# Patient Record
Sex: Female | Born: 1955
Health system: Southern US, Community
[De-identification: ages and names within clinical notes are randomized; demographics above are authoritative.]

## PROBLEM LIST (undated history)

## (undated) DIAGNOSIS — F419 Anxiety disorder, unspecified: Secondary | ICD-10-CM

## (undated) DIAGNOSIS — J189 Pneumonia, unspecified organism: Secondary | ICD-10-CM

## (undated) DIAGNOSIS — F32A Depression, unspecified: Secondary | ICD-10-CM

## (undated) DIAGNOSIS — D649 Anemia, unspecified: Secondary | ICD-10-CM

## (undated) DIAGNOSIS — E119 Type 2 diabetes mellitus without complications: Secondary | ICD-10-CM

## (undated) DIAGNOSIS — C189 Malignant neoplasm of colon, unspecified: Secondary | ICD-10-CM

## (undated) DIAGNOSIS — E785 Hyperlipidemia, unspecified: Secondary | ICD-10-CM

## (undated) DIAGNOSIS — F329 Major depressive disorder, single episode, unspecified: Secondary | ICD-10-CM

## (undated) DIAGNOSIS — G510 Bell's palsy: Secondary | ICD-10-CM

## (undated) DIAGNOSIS — E559 Vitamin D deficiency, unspecified: Secondary | ICD-10-CM

## (undated) DIAGNOSIS — N879 Dysplasia of cervix uteri, unspecified: Secondary | ICD-10-CM

## (undated) HISTORY — DX: Type 2 diabetes mellitus without complications: E11.9

## (undated) HISTORY — DX: Malignant neoplasm of colon, unspecified: C18.9

## (undated) HISTORY — DX: Dysplasia of cervix uteri, unspecified: N87.9

## (undated) HISTORY — DX: Anxiety disorder, unspecified: F41.9

## (undated) HISTORY — DX: Hyperlipidemia, unspecified: E78.5

## (undated) HISTORY — DX: Vitamin D deficiency, unspecified: E55.9

## (undated) HISTORY — PX: APPENDECTOMY: SHX54

## (undated) HISTORY — PX: COLONOSCOPY: SHX174

## (undated) HISTORY — DX: Major depressive disorder, single episode, unspecified: F32.9

## (undated) HISTORY — DX: Depression, unspecified: F32.A

---
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
## 2004-02-21 ENCOUNTER — Ambulatory Visit (HOSPITAL_COMMUNITY): Admission: RE | Admit: 2004-02-21 | Discharge: 2004-02-21 | Payer: Self-pay | Admitting: Internal Medicine

## 2004-04-04 ENCOUNTER — Ambulatory Visit: Payer: Self-pay | Admitting: Oncology

## 2004-04-17 ENCOUNTER — Encounter (INDEPENDENT_AMBULATORY_CARE_PROVIDER_SITE_OTHER): Payer: Self-pay | Admitting: Specialist

## 2004-04-17 ENCOUNTER — Ambulatory Visit (HOSPITAL_COMMUNITY): Admission: RE | Admit: 2004-04-17 | Discharge: 2004-04-17 | Payer: Self-pay | Admitting: Oncology

## 2006-11-04 ENCOUNTER — Ambulatory Visit: Payer: Self-pay | Admitting: Family Medicine

## 2006-11-04 ENCOUNTER — Inpatient Hospital Stay (HOSPITAL_COMMUNITY): Admission: EM | Admit: 2006-11-04 | Discharge: 2006-11-07 | Payer: Self-pay | Admitting: Emergency Medicine

## 2007-09-12 ENCOUNTER — Other Ambulatory Visit: Admission: RE | Admit: 2007-09-12 | Discharge: 2007-09-12 | Payer: Self-pay | Admitting: Internal Medicine

## 2008-05-31 HISTORY — PX: PARTIAL COLECTOMY: SHX5273

## 2008-05-31 HISTORY — PX: LAPAROSCOPIC CHOLECYSTECTOMY: SUR755

## 2008-06-16 ENCOUNTER — Emergency Department (HOSPITAL_COMMUNITY): Admission: EM | Admit: 2008-06-16 | Discharge: 2008-06-17 | Payer: Self-pay | Admitting: Emergency Medicine

## 2008-07-08 ENCOUNTER — Ambulatory Visit (HOSPITAL_COMMUNITY): Admission: RE | Admit: 2008-07-08 | Discharge: 2008-07-08 | Payer: Self-pay | Admitting: Internal Medicine

## 2008-07-12 ENCOUNTER — Emergency Department (HOSPITAL_COMMUNITY): Admission: EM | Admit: 2008-07-12 | Discharge: 2008-07-13 | Payer: Self-pay | Admitting: Emergency Medicine

## 2008-08-16 ENCOUNTER — Encounter (INDEPENDENT_AMBULATORY_CARE_PROVIDER_SITE_OTHER): Payer: Self-pay | Admitting: *Deleted

## 2008-08-16 ENCOUNTER — Ambulatory Visit (HOSPITAL_COMMUNITY): Admission: RE | Admit: 2008-08-16 | Discharge: 2008-08-16 | Payer: Self-pay | Admitting: *Deleted

## 2008-09-10 ENCOUNTER — Emergency Department (HOSPITAL_BASED_OUTPATIENT_CLINIC_OR_DEPARTMENT_OTHER): Admission: EM | Admit: 2008-09-10 | Discharge: 2008-09-10 | Payer: Self-pay | Admitting: Emergency Medicine

## 2008-09-10 ENCOUNTER — Ambulatory Visit: Payer: Self-pay | Admitting: Diagnostic Radiology

## 2008-12-08 ENCOUNTER — Emergency Department (HOSPITAL_COMMUNITY): Admission: EM | Admit: 2008-12-08 | Discharge: 2008-12-09 | Payer: Self-pay | Admitting: Emergency Medicine

## 2009-01-10 ENCOUNTER — Encounter: Admission: RE | Admit: 2009-01-10 | Discharge: 2009-01-10 | Payer: Self-pay | Admitting: General Surgery

## 2009-01-15 ENCOUNTER — Inpatient Hospital Stay (HOSPITAL_COMMUNITY): Admission: RE | Admit: 2009-01-15 | Discharge: 2009-01-21 | Payer: Self-pay | Admitting: General Surgery

## 2009-01-15 ENCOUNTER — Encounter (INDEPENDENT_AMBULATORY_CARE_PROVIDER_SITE_OTHER): Payer: Self-pay | Admitting: General Surgery

## 2009-01-29 ENCOUNTER — Ambulatory Visit: Payer: Self-pay | Admitting: Hematology & Oncology

## 2009-02-10 LAB — CEA: CEA: 1.3 ng/mL (ref 0.0–5.0)

## 2009-02-10 LAB — COMPREHENSIVE METABOLIC PANEL
Albumin: 4.5 g/dL (ref 3.5–5.2)
BUN: 11 mg/dL (ref 6–23)
Calcium: 9.9 mg/dL (ref 8.4–10.5)
Chloride: 104 mEq/L (ref 96–112)
Glucose, Bld: 141 mg/dL — ABNORMAL HIGH (ref 70–99)
Potassium: 4.7 mEq/L (ref 3.5–5.3)

## 2009-02-10 LAB — CBC WITH DIFFERENTIAL (CANCER CENTER ONLY)
BASO%: 0.6 % (ref 0.0–2.0)
EOS%: 5 % (ref 0.0–7.0)
HCT: 33.8 % — ABNORMAL LOW (ref 34.8–46.6)
LYMPH#: 2.5 10*3/uL (ref 0.9–3.3)
MCHC: 32.3 g/dL (ref 32.0–36.0)
NEUT#: 5.7 10*3/uL (ref 1.5–6.5)
NEUT%: 62.5 % (ref 39.6–80.0)
RDW: 16.6 % — ABNORMAL HIGH (ref 10.5–14.6)

## 2009-02-19 ENCOUNTER — Ambulatory Visit (HOSPITAL_COMMUNITY): Admission: RE | Admit: 2009-02-19 | Discharge: 2009-02-19 | Payer: Self-pay | Admitting: General Surgery

## 2009-03-07 ENCOUNTER — Ambulatory Visit: Payer: Self-pay | Admitting: Hematology & Oncology

## 2009-03-10 LAB — COMPREHENSIVE METABOLIC PANEL
ALT: 23 U/L (ref 0–35)
Albumin: 3.9 g/dL (ref 3.5–5.2)
Alkaline Phosphatase: 100 U/L (ref 39–117)
CO2: 23 mEq/L (ref 19–32)
Potassium: 4.2 mEq/L (ref 3.5–5.3)
Sodium: 138 mEq/L (ref 135–145)
Total Bilirubin: 0.5 mg/dL (ref 0.3–1.2)
Total Protein: 6.4 g/dL (ref 6.0–8.3)

## 2009-03-10 LAB — CBC WITH DIFFERENTIAL (CANCER CENTER ONLY)
Eosinophils Absolute: 0.4 10*3/uL (ref 0.0–0.5)
HCT: 31 % — ABNORMAL LOW (ref 34.8–46.6)
LYMPH%: 27.8 % (ref 14.0–48.0)
MCV: 61 fL — ABNORMAL LOW (ref 81–101)
MONO#: 0.5 10*3/uL (ref 0.1–0.9)
NEUT%: 62.1 % (ref 39.6–80.0)
RBC: 5.08 10*6/uL (ref 3.70–5.32)
RDW: 17.4 % — ABNORMAL HIGH (ref 10.5–14.6)
WBC: 9 10*3/uL (ref 3.9–10.0)

## 2009-03-24 LAB — CBC WITH DIFFERENTIAL (CANCER CENTER ONLY)
BASO%: 0.8 % (ref 0.0–2.0)
EOS%: 4.3 % (ref 0.0–7.0)
HCT: 35.7 % (ref 34.8–46.6)
LYMPH%: 29.1 % (ref 14.0–48.0)
MCH: 20.3 pg — ABNORMAL LOW (ref 26.0–34.0)
MCHC: 31.9 g/dL — ABNORMAL LOW (ref 32.0–36.0)
MCV: 64 fL — ABNORMAL LOW (ref 81–101)
NEUT%: 60 % (ref 39.6–80.0)
RDW: 17.2 % — ABNORMAL HIGH (ref 10.5–14.6)

## 2009-03-25 LAB — COMPREHENSIVE METABOLIC PANEL
Alkaline Phosphatase: 112 U/L (ref 39–117)
BUN: 17 mg/dL (ref 6–23)
Glucose, Bld: 210 mg/dL — ABNORMAL HIGH (ref 70–99)
Sodium: 141 mEq/L (ref 135–145)
Total Bilirubin: 0.5 mg/dL (ref 0.3–1.2)

## 2009-04-11 ENCOUNTER — Ambulatory Visit: Payer: Self-pay | Admitting: Hematology & Oncology

## 2009-04-14 LAB — CBC WITH DIFFERENTIAL (CANCER CENTER ONLY)
BASO#: 0.1 10*3/uL (ref 0.0–0.2)
Eosinophils Absolute: 0.5 10*3/uL (ref 0.0–0.5)
HCT: 38.4 % (ref 34.8–46.6)
HGB: 12.7 g/dL (ref 11.6–15.9)
LYMPH%: 28.1 % (ref 14.0–48.0)
MCV: 63 fL — ABNORMAL LOW (ref 81–101)
MONO#: 0.7 10*3/uL (ref 0.1–0.9)
NEUT%: 60.4 % (ref 39.6–80.0)
RBC: 6.07 10*6/uL — ABNORMAL HIGH (ref 3.70–5.32)
RDW: 18.7 % — ABNORMAL HIGH (ref 10.5–14.6)
WBC: 10.6 10*3/uL — ABNORMAL HIGH (ref 3.9–10.0)

## 2009-04-14 LAB — COMPREHENSIVE METABOLIC PANEL
ALT: 19 U/L (ref 0–35)
BUN: 13 mg/dL (ref 6–23)
CO2: 24 mEq/L (ref 19–32)
Calcium: 9.1 mg/dL (ref 8.4–10.5)
Chloride: 102 mEq/L (ref 96–112)
Creatinine, Ser: 0.78 mg/dL (ref 0.40–1.20)
Glucose, Bld: 234 mg/dL — ABNORMAL HIGH (ref 70–99)

## 2009-04-14 LAB — TECHNOLOGIST REVIEW CHCC SATELLITE

## 2009-04-30 LAB — CBC WITH DIFFERENTIAL (CANCER CENTER ONLY)
Eosinophils Absolute: 0.3 10*3/uL (ref 0.0–0.5)
MONO#: 0.6 10*3/uL (ref 0.1–0.9)
MONO%: 8.9 % (ref 0.0–13.0)
NEUT#: 4 10*3/uL (ref 1.5–6.5)
Platelets: 190 10*3/uL (ref 145–400)
RBC: 5.35 10*6/uL — ABNORMAL HIGH (ref 3.70–5.32)
WBC: 6.9 10*3/uL (ref 3.9–10.0)

## 2009-04-30 LAB — COMPREHENSIVE METABOLIC PANEL
ALT: 15 U/L (ref 0–35)
Albumin: 4.2 g/dL (ref 3.5–5.2)
CO2: 24 mEq/L (ref 19–32)
Calcium: 9.1 mg/dL (ref 8.4–10.5)
Chloride: 103 mEq/L (ref 96–112)
Glucose, Bld: 219 mg/dL — ABNORMAL HIGH (ref 70–99)
Sodium: 138 mEq/L (ref 135–145)
Total Protein: 6.9 g/dL (ref 6.0–8.3)

## 2009-05-13 ENCOUNTER — Ambulatory Visit: Payer: Self-pay | Admitting: Hematology & Oncology

## 2009-05-14 LAB — COMPREHENSIVE METABOLIC PANEL
ALT: 15 U/L (ref 0–35)
AST: 18 U/L (ref 0–37)
Alkaline Phosphatase: 124 U/L — ABNORMAL HIGH (ref 39–117)
Creatinine, Ser: 0.76 mg/dL (ref 0.40–1.20)
Sodium: 138 mEq/L (ref 135–145)
Total Bilirubin: 0.8 mg/dL (ref 0.3–1.2)
Total Protein: 7.2 g/dL (ref 6.0–8.3)

## 2009-05-14 LAB — CBC WITH DIFFERENTIAL (CANCER CENTER ONLY)
Eosinophils Absolute: 0.3 10*3/uL (ref 0.0–0.5)
HCT: 35.8 % (ref 34.8–46.6)
LYMPH%: 21.9 % (ref 14.0–48.0)
MCV: 65 fL — ABNORMAL LOW (ref 81–101)
MONO#: 0.7 10*3/uL (ref 0.1–0.9)
Platelets: 174 10*3/uL (ref 145–400)
RBC: 5.51 10*6/uL — ABNORMAL HIGH (ref 3.70–5.32)
WBC: 9.7 10*3/uL (ref 3.9–10.0)

## 2009-05-14 LAB — TECHNOLOGIST REVIEW CHCC SATELLITE

## 2009-06-02 ENCOUNTER — Ambulatory Visit: Payer: Self-pay | Admitting: Hematology & Oncology

## 2009-06-02 LAB — CBC WITH DIFFERENTIAL (CANCER CENTER ONLY)
BASO#: 0.1 10*3/uL (ref 0.0–0.2)
BASO%: 0.8 % (ref 0.0–2.0)
HCT: 35.5 % (ref 34.8–46.6)
HGB: 11.6 g/dL (ref 11.6–15.9)
LYMPH#: 2.2 10*3/uL (ref 0.9–3.3)
LYMPH%: 30.1 % (ref 14.0–48.0)
MCV: 68 fL — ABNORMAL LOW (ref 81–101)
MONO#: 0.6 10*3/uL (ref 0.1–0.9)
NEUT%: 56.1 % (ref 39.6–80.0)
RDW: 21.2 % — ABNORMAL HIGH (ref 10.5–14.6)
WBC: 7.2 10*3/uL (ref 3.9–10.0)

## 2009-06-02 LAB — CEA: CEA: 3.2 ng/mL (ref 0.0–5.0)

## 2009-06-04 LAB — COMPREHENSIVE METABOLIC PANEL
ALT: 10 U/L (ref 0–35)
BUN: 7 mg/dL (ref 6–23)
CO2: 19 mEq/L (ref 19–32)
Calcium: 8.6 mg/dL (ref 8.4–10.5)
Chloride: 102 mEq/L (ref 96–112)
Creatinine, Ser: 0.57 mg/dL (ref 0.40–1.20)
Glucose, Bld: 213 mg/dL — ABNORMAL HIGH (ref 70–99)
Total Bilirubin: 0.8 mg/dL (ref 0.3–1.2)

## 2009-06-18 LAB — COMPREHENSIVE METABOLIC PANEL
ALT: 11 U/L (ref 0–35)
AST: 17 U/L (ref 0–37)
Albumin: 4 g/dL (ref 3.5–5.2)
BUN: 7 mg/dL (ref 6–23)
CO2: 26 mEq/L (ref 19–32)
Calcium: 9.2 mg/dL (ref 8.4–10.5)
Chloride: 101 mEq/L (ref 96–112)
Creatinine, Ser: 0.63 mg/dL (ref 0.40–1.20)
Potassium: 3.9 mEq/L (ref 3.5–5.3)

## 2009-06-18 LAB — CBC WITH DIFFERENTIAL (CANCER CENTER ONLY)
BASO#: 0 10*3/uL (ref 0.0–0.2)
EOS%: 4 % (ref 0.0–7.0)
HCT: 35.2 % (ref 34.8–46.6)
HGB: 11.4 g/dL — ABNORMAL LOW (ref 11.6–15.9)
LYMPH#: 2 10*3/uL (ref 0.9–3.3)
MCHC: 32.3 g/dL (ref 32.0–36.0)
MONO#: 0.4 10*3/uL (ref 0.1–0.9)
NEUT#: 3.3 10*3/uL (ref 1.5–6.5)
NEUT%: 55.9 % (ref 39.6–80.0)
RBC: 5.09 10*6/uL (ref 3.70–5.32)
WBC: 6 10*3/uL (ref 3.9–10.0)

## 2009-06-30 LAB — CBC WITH DIFFERENTIAL (CANCER CENTER ONLY)
BASO%: 0.6 % (ref 0.0–2.0)
EOS%: 2 % (ref 0.0–7.0)
HGB: 11.6 g/dL (ref 11.6–15.9)
LYMPH#: 2.1 10*3/uL (ref 0.9–3.3)
MCHC: 32.2 g/dL (ref 32.0–36.0)
MONO#: 0.5 10*3/uL (ref 0.1–0.9)
NEUT#: 5.8 10*3/uL (ref 1.5–6.5)
RDW: 16.8 % — ABNORMAL HIGH (ref 10.5–14.6)
WBC: 8.7 10*3/uL (ref 3.9–10.0)

## 2009-06-30 LAB — BASIC METABOLIC PANEL
CO2: 22 mEq/L (ref 19–32)
Chloride: 102 mEq/L (ref 96–112)
Potassium: 3.9 mEq/L (ref 3.5–5.3)

## 2009-07-11 ENCOUNTER — Ambulatory Visit: Payer: Self-pay | Admitting: Hematology & Oncology

## 2009-07-14 LAB — CBC WITH DIFFERENTIAL (CANCER CENTER ONLY)
BASO%: 0.5 % (ref 0.0–2.0)
LYMPH#: 1.7 10*3/uL (ref 0.9–3.3)
MONO#: 0.4 10*3/uL (ref 0.1–0.9)
Platelets: 127 10*3/uL — ABNORMAL LOW (ref 145–400)
RDW: 16.5 % — ABNORMAL HIGH (ref 10.5–14.6)
WBC: 5.9 10*3/uL (ref 3.9–10.0)

## 2009-07-14 LAB — COMPREHENSIVE METABOLIC PANEL
ALT: 13 U/L (ref 0–35)
AST: 20 U/L (ref 0–37)
Alkaline Phosphatase: 112 U/L (ref 39–117)
Calcium: 9.1 mg/dL (ref 8.4–10.5)
Chloride: 103 mEq/L (ref 96–112)
Creatinine, Ser: 0.65 mg/dL (ref 0.40–1.20)
Potassium: 3.6 mEq/L (ref 3.5–5.3)

## 2009-07-14 LAB — TECHNOLOGIST REVIEW CHCC SATELLITE

## 2009-07-28 LAB — CBC WITH DIFFERENTIAL (CANCER CENTER ONLY)
BASO%: 0.6 % (ref 0.0–2.0)
EOS%: 2.5 % (ref 0.0–7.0)
HCT: 32.8 % — ABNORMAL LOW (ref 34.8–46.6)
LYMPH#: 2.1 10*3/uL (ref 0.9–3.3)
LYMPH%: 32.5 % (ref 14.0–48.0)
MCH: 22.9 pg — ABNORMAL LOW (ref 26.0–34.0)
MCHC: 32.2 g/dL (ref 32.0–36.0)
MONO%: 6.9 % (ref 0.0–13.0)
NEUT%: 57.5 % (ref 39.6–80.0)
RDW: 16.5 % — ABNORMAL HIGH (ref 10.5–14.6)

## 2009-09-25 ENCOUNTER — Ambulatory Visit: Payer: Self-pay | Admitting: Hematology & Oncology

## 2009-09-29 LAB — CBC WITH DIFFERENTIAL (CANCER CENTER ONLY)
Eosinophils Absolute: 0.3 10*3/uL (ref 0.0–0.5)
LYMPH%: 29.1 % (ref 14.0–48.0)
MCV: 68 fL — ABNORMAL LOW (ref 81–101)
MONO#: 0.6 10*3/uL (ref 0.1–0.9)
NEUT#: 7.2 10*3/uL — ABNORMAL HIGH (ref 1.5–6.5)
Platelets: 264 10*3/uL (ref 145–400)
RBC: 5.63 10*6/uL — ABNORMAL HIGH (ref 3.70–5.32)
WBC: 11.7 10*3/uL — ABNORMAL HIGH (ref 3.9–10.0)

## 2009-09-29 LAB — COMPREHENSIVE METABOLIC PANEL
Albumin: 4.6 g/dL (ref 3.5–5.2)
CO2: 24 mEq/L (ref 19–32)
Calcium: 9.8 mg/dL (ref 8.4–10.5)
Glucose, Bld: 148 mg/dL — ABNORMAL HIGH (ref 70–99)
Potassium: 4.1 mEq/L (ref 3.5–5.3)
Sodium: 137 mEq/L (ref 135–145)
Total Protein: 7.6 g/dL (ref 6.0–8.3)

## 2009-09-29 LAB — CEA: CEA: 1.8 ng/mL (ref 0.0–5.0)

## 2009-10-20 ENCOUNTER — Ambulatory Visit (HOSPITAL_BASED_OUTPATIENT_CLINIC_OR_DEPARTMENT_OTHER): Admission: RE | Admit: 2009-10-20 | Discharge: 2009-10-20 | Payer: Self-pay | Admitting: Hematology & Oncology

## 2009-10-20 ENCOUNTER — Ambulatory Visit: Payer: Self-pay | Admitting: Diagnostic Radiology

## 2009-11-12 ENCOUNTER — Ambulatory Visit: Payer: Self-pay | Admitting: Hematology & Oncology

## 2009-11-20 LAB — COMPREHENSIVE METABOLIC PANEL
BUN: 13 mg/dL (ref 6–23)
CO2: 22 mEq/L (ref 19–32)
Glucose, Bld: 119 mg/dL — ABNORMAL HIGH (ref 70–99)
Sodium: 138 mEq/L (ref 135–145)
Total Bilirubin: 0.7 mg/dL (ref 0.3–1.2)
Total Protein: 7.4 g/dL (ref 6.0–8.3)

## 2009-11-20 LAB — CBC WITH DIFFERENTIAL (CANCER CENTER ONLY)
BASO#: 0.1 10*3/uL (ref 0.0–0.2)
Eosinophils Absolute: 0.4 10*3/uL (ref 0.0–0.5)
HCT: 35.1 % (ref 34.8–46.6)
HGB: 11.6 g/dL (ref 11.6–15.9)
LYMPH%: 33 % (ref 14.0–48.0)
MCH: 21.4 pg — ABNORMAL LOW (ref 26.0–34.0)
MCV: 64 fL — ABNORMAL LOW (ref 81–101)
MONO%: 4.5 % (ref 0.0–13.0)
NEUT%: 58.7 % (ref 39.6–80.0)
RBC: 5.44 10*6/uL — ABNORMAL HIGH (ref 3.70–5.32)

## 2010-02-12 ENCOUNTER — Ambulatory Visit: Payer: Self-pay | Admitting: Hematology & Oncology

## 2010-02-23 ENCOUNTER — Ambulatory Visit (HOSPITAL_BASED_OUTPATIENT_CLINIC_OR_DEPARTMENT_OTHER): Admission: RE | Admit: 2010-02-23 | Discharge: 2010-02-23 | Payer: Self-pay | Admitting: General Surgery

## 2010-03-18 ENCOUNTER — Encounter (INDEPENDENT_AMBULATORY_CARE_PROVIDER_SITE_OTHER): Payer: Self-pay | Admitting: *Deleted

## 2010-05-04 ENCOUNTER — Ambulatory Visit (HOSPITAL_BASED_OUTPATIENT_CLINIC_OR_DEPARTMENT_OTHER)
Admission: RE | Admit: 2010-05-04 | Discharge: 2010-05-04 | Payer: Self-pay | Source: Home / Self Care | Admitting: Hematology & Oncology

## 2010-05-04 ENCOUNTER — Ambulatory Visit: Payer: Self-pay | Admitting: Hematology & Oncology

## 2010-06-30 NOTE — Letter (Signed)
Summary: Pre Visit Letter Revised  [facility]  [address] [place], [place] [address]   Phone: [phone]  Fax: [phone]        03/18/2010 MRN: [id number] [facility] [address] [place], [place]  [address]             Procedure Date:  04-29-10   Welcome to the Gastroenterology Division at [facility].    You are scheduled to see a nurse for your pre-procedure visit on 04-15-10 at 4:30p.m. on the 3rd floor at [facility], [address]  We ask that you try to arrive at our office 15 minutes prior to your appointment time to allow for check-in.  Please take a minute to review the attached form.  If you answer "Yes" to one or more of the questions on the first page, we ask that you call the person listed at your earliest opportunity.  If you answer "No" to all of the questions, please complete the rest of the form and bring it to your appointment.    Your nurse visit will consist of discussing your medical and surgical history, your immediate family medical history, and your medications.   If you are unable to list all of your medications on the form, please bring the medication bottles to your appointment and we will list them.  We will need to be aware of both prescribed and over the counter drugs.  We will need to know exact dosage information as well.    Please be prepared to read and sign documents such as consent forms, a financial agreement, and acknowledgement forms.  If necessary, and with your consent, a friend or relative is welcome to sit-in on the nurse visit with you.  Please bring your insurance card so that we may make a copy of it.  If your insurance requires a referral to see a specialist, please bring your referral form from your primary care physician.  No co-pay is required for this nurse visit.     If you cannot keep your appointment, please call [phone] to cancel or reschedule prior to your appointment date.  This allows [place]  the opportunity to schedule an appointment for another patient in need of care.    Thank you for choosing [facility] for your medical needs.  We appreciate the opportunity to care for you.  Please visit [place] at our website  to learn more about our practice.  Sincerely, The Gastroenterology Division

## 2010-09-04 LAB — CBC
HCT: 35 % — ABNORMAL LOW (ref 36.0–46.0)
Hemoglobin: 11.1 g/dL — ABNORMAL LOW (ref 12.0–15.0)
MCHC: 31.6 g/dL (ref 30.0–36.0)
MCV: 65.1 fL — ABNORMAL LOW (ref 78.0–100.0)
RBC: 5.37 MIL/uL — ABNORMAL HIGH (ref 3.87–5.11)

## 2010-09-05 LAB — CBC
HCT: 25.6 % — ABNORMAL LOW (ref 36.0–46.0)
HCT: 27.6 % — ABNORMAL LOW (ref 36.0–46.0)
HCT: 39.8 % (ref 36.0–46.0)
Hemoglobin: 12.5 g/dL (ref 12.0–15.0)
Hemoglobin: 8.2 g/dL — ABNORMAL LOW (ref 12.0–15.0)
Hemoglobin: 8.9 g/dL — ABNORMAL LOW (ref 12.0–15.0)
Hemoglobin: 9.8 g/dL — ABNORMAL LOW (ref 12.0–15.0)
MCHC: 31.3 g/dL (ref 30.0–36.0)
MCHC: 31.9 g/dL (ref 30.0–36.0)
MCHC: 32.2 g/dL (ref 30.0–36.0)
MCV: 63.3 fL — ABNORMAL LOW (ref 78.0–100.0)
MCV: 63.9 fL — ABNORMAL LOW (ref 78.0–100.0)
MCV: 64 fL — ABNORMAL LOW (ref 78.0–100.0)
Platelets: 300 K/uL (ref 150–400)
Platelets: 386 K/uL (ref 150–400)
RBC: 3.99 MIL/uL (ref 3.87–5.11)
RBC: 4.32 MIL/uL (ref 3.87–5.11)
RBC: 6.3 MIL/uL — ABNORMAL HIGH (ref 3.87–5.11)
RDW: 16.2 % — ABNORMAL HIGH (ref 11.5–15.5)
RDW: 16.4 % — ABNORMAL HIGH (ref 11.5–15.5)
RDW: 16.5 % — ABNORMAL HIGH (ref 11.5–15.5)
RDW: 16.6 % — ABNORMAL HIGH (ref 11.5–15.5)
WBC: 13.1 K/uL — ABNORMAL HIGH (ref 4.0–10.5)
WBC: 17.1 K/uL — ABNORMAL HIGH (ref 4.0–10.5)

## 2010-09-05 LAB — BASIC METABOLIC PANEL
GFR calc Af Amer: >60 mL/min (ref >60)
GFR calc non Af Amer: >60 mL/min (ref >60)
Glucose, Bld: 137 mg/dL — ABNORMAL HIGH (ref 70–99)
Potassium: 4.5 mEq/L (ref 3.5–5.1)
Sodium: 138 mEq/L (ref 135–145)

## 2010-09-05 LAB — COMPREHENSIVE METABOLIC PANEL
AST: 29 U/L (ref 0–37)
Albumin: 4 g/dL (ref 3.5–5.2)
Calcium: 9.6 mg/dL (ref 8.4–10.5)
Creatinine, Ser: 1.09 mg/dL (ref 0.4–1.2)
GFR calc Af Amer: >60 mL/min (ref >60)
GFR calc non Af Amer: 52 mL/min — ABNORMAL LOW (ref >60)

## 2010-09-05 LAB — TYPE AND SCREEN
ABO/RH(D): A NEG
Antibody Screen: NEGATIVE

## 2010-09-06 LAB — CBC
HCT: 37 % (ref 36.0–46.0)
Hemoglobin: 11.5 g/dL — ABNORMAL LOW (ref 12.0–15.0)
RDW: 16.4 % — ABNORMAL HIGH (ref 11.5–15.5)
WBC: 14.1 10*3/uL — ABNORMAL HIGH (ref 4.0–10.5)

## 2010-09-06 LAB — URINALYSIS, ROUTINE W REFLEX MICROSCOPIC
Bilirubin Urine: NEGATIVE
Glucose, UA: NEGATIVE mg/dL
Ketones, ur: NEGATIVE mg/dL
Protein, ur: NEGATIVE mg/dL
Urobilinogen, UA: 1 mg/dL (ref 0.0–1.0)

## 2010-09-06 LAB — COMPREHENSIVE METABOLIC PANEL
ALT: 13 U/L (ref 0–35)
Albumin: 3.6 g/dL (ref 3.5–5.2)
Alkaline Phosphatase: 87 U/L (ref 39–117)
BUN: 13 mg/dL (ref 6–23)
Chloride: 104 mEq/L (ref 96–112)
Glucose, Bld: 149 mg/dL — ABNORMAL HIGH (ref 70–99)
Potassium: 4.1 mEq/L (ref 3.5–5.1)
Sodium: 139 mEq/L (ref 135–145)
Total Bilirubin: 0.4 mg/dL (ref 0.3–1.2)
Total Protein: 6.4 g/dL (ref 6.0–8.3)

## 2010-09-06 LAB — URINE MICROSCOPIC-ADD ON

## 2010-09-06 LAB — DIFFERENTIAL
Basophils Absolute: 0.2 10*3/uL — ABNORMAL HIGH (ref 0.0–0.1)
Basophils Relative: 1 % (ref 0–1)
Eosinophils Absolute: 0.4 10*3/uL (ref 0.0–0.7)
Monocytes Absolute: 0.6 10*3/uL (ref 0.1–1.0)
Monocytes Relative: 4 % (ref 3–12)
Neutro Abs: 9.5 10*3/uL — ABNORMAL HIGH (ref 1.7–7.7)
Neutrophils Relative %: 67 % (ref 43–77)

## 2010-09-09 LAB — DIFFERENTIAL
Eosinophils Absolute: 0.2 10*3/uL (ref 0.0–0.7)
Eosinophils Relative: 2 % (ref 0–5)
Lymphs Abs: 2.3 10*3/uL (ref 0.7–4.0)
Monocytes Absolute: 0.5 10*3/uL (ref 0.1–1.0)

## 2010-09-09 LAB — CBC
HCT: 39.7 % (ref 36.0–46.0)
Hemoglobin: 12.1 g/dL (ref 12.0–15.0)
RBC: 6.21 MIL/uL — ABNORMAL HIGH (ref 3.87–5.11)
RDW: 15.7 % — ABNORMAL HIGH (ref 11.5–15.5)
WBC: 12 10*3/uL — ABNORMAL HIGH (ref 4.0–10.5)

## 2010-09-09 LAB — URINALYSIS, ROUTINE W REFLEX MICROSCOPIC
Glucose, UA: NEGATIVE mg/dL
Hgb urine dipstick: NEGATIVE
Ketones, ur: 15 mg/dL — AB
Protein, ur: NEGATIVE mg/dL
Urobilinogen, UA: 1 mg/dL (ref 0.0–1.0)

## 2010-09-09 LAB — COMPREHENSIVE METABOLIC PANEL
ALT: 12 U/L (ref 0–35)
Alkaline Phosphatase: 113 U/L (ref 39–117)
BUN: 18 mg/dL (ref 6–23)
CO2: 26 mEq/L (ref 19–32)
Chloride: 102 mEq/L (ref 96–112)
Glucose, Bld: 117 mg/dL — ABNORMAL HIGH (ref 70–99)
Potassium: 4.2 mEq/L (ref 3.5–5.1)
Sodium: 140 mEq/L (ref 135–145)
Total Bilirubin: 0.6 mg/dL (ref 0.3–1.2)
Total Protein: 7.4 g/dL (ref 6.0–8.3)

## 2010-09-09 LAB — URINE MICROSCOPIC-ADD ON

## 2010-09-10 LAB — COMPREHENSIVE METABOLIC PANEL
AST: 18 U/L (ref 0–37)
Albumin: 3.8 g/dL (ref 3.5–5.2)
Calcium: 9.3 mg/dL (ref 8.4–10.5)
Chloride: 105 mEq/L (ref 96–112)
Creatinine, Ser: 0.82 mg/dL (ref 0.4–1.2)
GFR calc Af Amer: >60 mL/min (ref >60)
Sodium: 138 mEq/L (ref 135–145)

## 2010-09-10 LAB — DIFFERENTIAL
Basophils Relative: 1 % (ref 0–1)
Eosinophils Relative: 5 % (ref 0–5)
Lymphs Abs: 3.3 10*3/uL (ref 0.7–4.0)
Monocytes Relative: 5 % (ref 3–12)
Neutro Abs: 7.3 10*3/uL (ref 1.7–7.7)

## 2010-09-10 LAB — CBC
MCHC: 31 g/dL (ref 30.0–36.0)
MCV: 64.1 fL — ABNORMAL LOW (ref 78.0–100.0)
Platelets: 376 10*3/uL (ref 150–400)
WBC: 11.9 10*3/uL — ABNORMAL HIGH (ref 4.0–10.5)

## 2010-09-14 LAB — COMPREHENSIVE METABOLIC PANEL
AST: 54 U/L — ABNORMAL HIGH (ref 0–37)
Albumin: 3.9 g/dL (ref 3.5–5.2)
Chloride: 103 mEq/L (ref 96–112)
Creatinine, Ser: 0.81 mg/dL (ref 0.4–1.2)
GFR calc Af Amer: >60 mL/min (ref >60)
Total Bilirubin: 1.4 mg/dL — ABNORMAL HIGH (ref 0.3–1.2)
Total Protein: 6.8 g/dL (ref 6.0–8.3)

## 2010-09-14 LAB — CBC
MCV: 62.9 fL — ABNORMAL LOW (ref 78.0–100.0)
Platelets: 431 10*3/uL — ABNORMAL HIGH (ref 150–400)
RDW: 15.8 % — ABNORMAL HIGH (ref 11.5–15.5)
WBC: 14.9 10*3/uL — ABNORMAL HIGH (ref 4.0–10.5)

## 2010-09-14 LAB — URINALYSIS, ROUTINE W REFLEX MICROSCOPIC
Glucose, UA: NEGATIVE mg/dL
Hgb urine dipstick: NEGATIVE
Specific Gravity, Urine: 1.021 (ref 1.005–1.030)
Urobilinogen, UA: 0.2 mg/dL (ref 0.0–1.0)
pH: 6 (ref 5.0–8.0)

## 2010-09-14 LAB — POTASSIUM: Potassium: 3.9 mEq/L (ref 3.5–5.1)

## 2010-09-14 LAB — DIFFERENTIAL
Basophils Relative: 1 % (ref 0–1)
Eosinophils Absolute: 0.3 10*3/uL (ref 0.0–0.7)
Eosinophils Relative: 2 % (ref 0–5)
Lymphocytes Relative: 26 % (ref 12–46)
Monocytes Relative: 4 % (ref 3–12)
Neutro Abs: 10 10*3/uL — ABNORMAL HIGH (ref 1.7–7.7)
Neutrophils Relative %: 67 % (ref 43–77)

## 2010-09-14 LAB — URINE MICROSCOPIC-ADD ON

## 2010-09-15 LAB — CBC
HCT: 40 % (ref 36.0–46.0)
Hemoglobin: 12.9 g/dL (ref 12.0–15.0)
MCV: 63.1 fL — ABNORMAL LOW (ref 78.0–100.0)
RBC: 6.34 MIL/uL — ABNORMAL HIGH (ref 3.87–5.11)
WBC: 16.8 10*3/uL — ABNORMAL HIGH (ref 4.0–10.5)

## 2010-09-15 LAB — COMPREHENSIVE METABOLIC PANEL
Albumin: 3.7 g/dL (ref 3.5–5.2)
Alkaline Phosphatase: 87 U/L (ref 39–117)
BUN: 14 mg/dL (ref 6–23)
CO2: 23 mEq/L (ref 19–32)
Chloride: 100 mEq/L (ref 96–112)
Creatinine, Ser: 0.78 mg/dL (ref 0.4–1.2)
GFR calc non Af Amer: >60 mL/min (ref >60)
Potassium: 4 mEq/L (ref 3.5–5.1)
Total Bilirubin: 1.1 mg/dL (ref 0.3–1.2)

## 2010-09-15 LAB — DIFFERENTIAL
Basophils Absolute: 0 10*3/uL (ref 0.0–0.1)
Eosinophils Absolute: 0.2 10*3/uL (ref 0.0–0.7)
Lymphocytes Relative: 17 % (ref 12–46)
Lymphs Abs: 2.9 10*3/uL (ref 0.7–4.0)
Monocytes Relative: 3 % (ref 3–12)
Neutro Abs: 13.2 10*3/uL — ABNORMAL HIGH (ref 1.7–7.7)

## 2010-09-15 LAB — URINALYSIS, ROUTINE W REFLEX MICROSCOPIC
Hgb urine dipstick: NEGATIVE
Nitrite: NEGATIVE
Protein, ur: NEGATIVE mg/dL
Specific Gravity, Urine: 1.022 (ref 1.005–1.030)
Urobilinogen, UA: 0.2 mg/dL (ref 0.0–1.0)

## 2010-09-15 LAB — URINE MICROSCOPIC-ADD ON

## 2010-09-15 LAB — LIPASE, BLOOD: Lipase: 20 U/L (ref 11–59)

## 2010-10-13 NOTE — Op Note (Signed)
NAME:  Dorothy Boyd, ACKERT NO.:  [id number]   MEDICAL RECORD NO.:  [id number]          PATIENT TYPE:  AMB   LOCATION:  DAY                          FACILITY:  [facility]   PHYSICIAN:  Alfonse Ras, MD   DATE OF BIRTH:  Sep 09, 1955   DATE OF PROCEDURE:  DATE OF DISCHARGE:                               OPERATIVE REPORT   PREOPERATIVE DIAGNOSIS:  Symptomatic cholelithiasis   POSTOPERATIVE DIAGNOSIS:  Symptomatic cholelithiasis, normal  cholangiogram.   SURGEON:  Alfonse Ras, MD   ASSISTANT:  Juanetta Gosling, MD   PROCEDURE:  Laparoscopic cholecystectomy with intraoperative  cholangiogram.   DESCRIPTION OF PROCEDURE:  After extensive informed consent was granted  by the patient both in the office and in the preoperative holding area,  she was taken to the operating room at [facility] and  underwent endotracheal tube intubation with general anesthesia.  The  abdomen was prepped and draped in normal sterile fashion.  Using a  transverse infraumbilical incision, I dissected down to the fascia.  This was opened vertically.  An 0-Vicryl pursestring suture was placed  around the fascial defect and Hassan trocar was placed in the abdomen.  Pneumoperitoneum was obtained.  Under direct vision, an 11-mm trocar was  placed in subxiphoid region and two 5-mm trocars were placed in the  right abdomen.  The abdomen was inspected including the pelvis, and no  pathology was immediately evident.  The gallbladder was identified, was  quite small and contracted and was retracted with cephalad.  Dissection  at the neck of the gallbladder gave a critical view of the cystic duct  which was clipped proximally and a small ductotomy was made.  Cholangiogram was performed with a Reddick catheter which showed free  flow into the duodenum, normal filling of the common bile duct, common  hepatic duct, and right and left hepatic ducts.  The initial view showed  probable air  bubble in the common hepatic duct which cleared with the  second run.  There was no evidence of filling defects.  The catheter was  removed and the cystic duct was triply clipped and divided.  Critical  view of the cystic artery was obtained and triply clipped and divided.  Gallbladder was taken off the gallbladder bed using Bovie electrocautery  without difficulty and placed in EndoCatch bag.  Adequate hemostasis was  ensured.  The right upper quadrant was copiously irrigated.  The  gallbladder was removed through the umbilical port.  The fascial defect  was closed with the O Vicryl pursestring suture.  Pneumoperitoneum was  released.  Skin incisions were closed with subcuticular 4-0 Monocryl.  Steri-Strips and sterile dressings were applied.  The patient tolerated  the procedure well and went to PACU in good condition.      Alfonse Ras, MD  Electronically Signed     KRE/MEDQ  D:  08/16/2008  T:  08/16/2008  Job:  [id number]

## 2010-10-13 NOTE — H&P (Signed)
NAMEMarland Boyd  HALLEL, DENHERDER NO.:  [id number]   MEDICAL RECORD NO.:  [id number]          PATIENT TYPE:  INP   LOCATION:  5038                         FACILITY:  [facility]   PHYSICIAN:  Johney Maine, M.D.   DATE OF BIRTH:  [birth year]   DATE OF ADMISSION:  11/04/2006  DATE OF DISCHARGE:                              HISTORY & PHYSICAL   CHIEF COMPLAINT:  Sent from [facility] for pneumonia.   HISTORY OF PRESENT ILLNESS:  This is a 55 year old female sent from  Urgent Care, with a worsening pneumonia.  The pneumonia was originally  right lower lobe; however, now is bilateral.  The patient missed a  follow-up appointment yesterday.  This got worse, despite Rocephin x1,  and Avelox 1 day, given at her first appointment.  The patient did not  refill her medications on the second day and missed her appointment, and  on today it was noted that the pneumonia was worse.  The patient states  the symptoms started over the weekend with sinus symptoms and got worse.  She was then diagnosed with a pneumonia on Wednesday.  She was also  given COPD medications such as prednisone, Advair, albuterol, Spiriva  and antibiotics, although does not have a COPD diagnosis yet.  The  patient said she has had difficulty breathing but only with walking.  She took a breathing treatment last night which has helped,  as well as  the inhalers have helped.  The patient denies fevers.  The patient has  never been sick like this before.  The patient has never had wheezing,  prior to this week on Tuesday.  The patient is eating and drinking well.  She feels weak overall.   PAST MEDICAL HISTORY:  Depression, COPD questionable.   MEDICATIONS:  Avelox only 1 day, Cymbalta, and then newly prescribed  Spiriva, Advair and albuterol.  Dose is unknown.   ALLERGIES:  CODEINE MAKES HER VOMIT.   FAMILY HISTORY:  Noncontributory.   SURGICAL HISTORY:  Appendectomy.   SOCIAL HISTORY:  The patient is a  smoker, although she quit Monday.  The  patient had smoked one pack per day for many years, although after age  85.  Occasional alcohol, denies drugs.  She is employed.   REVIEW OF SYSTEMS:  See HPI, as well as no nausea/vomiting, no diarrhea,  no change in stools, no weight loss, no sweats, and denies headache.   PHYSICAL EXAM:  T current 98.2, heart rate 80, blood pressure 112/70,  respiratory rate 28, although while in the room it was 20.  O2 is 98% on  3 liters.   PHYSICAL EXAMINATION:  GENERAL:  Not in acute distress.  HEENT:  Pupils are equal, round, reactive to light.  Oral mucosa is  moist, throat is without erythema, negative sclera icterus.  NECK:  Supple.  No thyromegaly.  No cervical nor supraclavicular  adenopathy.  CARDIOVASCULAR:  Regular rhythm.  No rubs, gallops, murmurs.  PULMONARY:  Diffuse inspiratory and expiratory wheezes bilaterally,  positive crackles, right greater than left, lower base, and diffuse  rhonchi  bilaterally; however, no increased work of breathing.  The  patient is able to complete sentences.  ABDOMEN:  Soft, obese, nontender,  positive bowel sounds.  MUSCULOSKELETAL:  Full range of movement 5/5 upper and lower strength.  EXTREMITIES:  2+ pulses ,no edema.  SKIN:  Yellowish tan (she does tan), otherwise unremarkable.  NEURO:  Cranial nerves II-XII intact.  Cerebellar function intact.  She  is alert and oriented x3.   LABS:  White blood cells are elevated at 15.6.  Hemoglobin was low at  11.6, hematocrit 37.3, platelets are 413, and MCV is low at 95.3. RDW  16.6.  Sodium is normal at 134, potassium normal at 3.6, chloride 98,  bicarb 28, BUN 15, creatinine 0.72, glucose of 90 and  calcium is 9.   MEDICATIONS:  Given  in the ED:  Azithromycin and Ancef.   ASSESSMENT/PLAN:  This a 55 year old female admitted for:  1. Pneumonia, atypical nature since afebrile, dry cough, but fairly      significant exam.  We will start Rocephin 1 gram IV q.24 h.,  as      well as azithromycin 500 mg p.o. daily to cover this pneumonia.  We      did discuss the benefits of a fluoroquinolone and Avelox, since the      patient has a picture of chronic obstructive pulmonary disease, but      feels she would respond to the cephalosporin and macrolide. A BMET      and CBC, blood culture were drawn.  Chest x-ray shows diffuse      patchy infiltrate, bilaterally based on my read.  There is no need      to repeat a chest x-ray in the morning.  At this point, will follow      her clinically.  The patient does not seem to be in need of oxygen      but will use O2 to keep sats greater than 95%.  Given the      questionable history of chronic obstructive pulmonary disease and      wheezing, will also treat as a COPD exacerbation, see below.  2. Questionable COPD exacerbation:  No previous diagnosis of COPD, but      the patient presents with increased cough, dyspnea, and although      denies sputum, she did respond to inhalers.  Will continue COPD      exacerbation treatment with Atrovent, albuterol, prednisone 40 mg      and O2 p.r.n..  We will follow her response.  3. Depression.  Continue Cymbalta.  4. Tobacco abuse, nicotine patch p.r.n., smoking cessation if the      patient desires.  5. Prophylaxis.  SCDs.           ______________________________  Johney Maine, M.D.     JT/MEDQ  D:  11/04/2006  T:  11/05/2006  Job:  [id number]

## 2010-10-13 NOTE — Discharge Summary (Signed)
Dorothy Boyd, Dorothy Boyd NO.:  [id number]   MEDICAL RECORD NO.:  [id number]          PATIENT TYPE:  INP   LOCATION:  5038                         FACILITY:  [facility]   PHYSICIAN:  Johney Maine, M.D.   DATE OF BIRTH:  05-Nov-1955   DATE OF ADMISSION:  11/04/2006  DATE OF DISCHARGE:  11/07/2006                               DISCHARGE SUMMARY   ADMITTING DIAGNOSES:  1. Pneumonia.  2. Status post chronic obstructive pulmonary disease exacerbation.  3. Depression.  4. Tobacco abuse.   DISCHARGE DIAGNOSES:  1. Community acquired pneumonia, atypical, resolving.  2. Chronic obstructive pulmonary disease.  3. Depression.  4. Tobacco abuse.   DISCHARGE MEDICATIONS:  1. Cymbalta 60 mg p.o. daily.  2. Spiriva 18 mcg capsule inhaled daily.  3. Azithromycin 500 mg 1 tablet p.o. x1 day.  4. Mucinex 600 mg 1 tab p.o. b.i.d. for congestion, cough.  5. Prednisone 20 mg 2 tablets p.o. daily x3 days.  6. Advair 250/50, one puff inhaled b.i.d. as previously used.  7. Albuterol inhaler 1 puff inhaled q.4-6 hours p.r.n. wheezing and      shortness of breath.   FOLLOWUP:  The patient will be follow up to primary care physician Dr.  Elisabeth Most on [place] for an appointment this week.   HOSPITAL COURSE:  This is a 55 year old female who sent over from [facility] for a pneumonia that failed outpatient treatment.  She was  admitted without oxygen requirement; however, looked ill on physical  exam with a pretty impressive bilateral infiltrate on x-ray.  Please see  following for details:   1. Pneumonia:  The patient's x-ray was impressive and showed a      bilateral infiltrate.  This had worsened after one dose of Rocephin      and one dose of Avelox in the outpatient setting.  We admitted her      for what appeared to be a community acquired pneumonia, more      atypical in nature.  She received 2 doses of Rocephin and a 4-day      course of azithromycin.  She was  discharged with one further day      for a total of 5 days of azithromycin.  She never required oxygen      during her hospital stay.  She remained afebrile.  Her lung exam      improved, and on the day of discharge, she had faint wheezes;      however, was much improved from the day of admission.  She will      need close follow up with her primary care physician and to      complete her course of antibiotics.  2. COPD:  The patient does not have an office COPD diagnosis, although      was given inhalers that usually treat COPD earlier in the week.      She does state the COPD picture with extensive pack history of      smoking.  She also had very prominent wheezing on physical exam  when she was admitted.  We treated the patient for a COPD      exacerbation with Atrovent inhalers, albuterol inhalers, and      prednisone.  We did not start Avelox, and instead treated her as      more of a community acquired pneumonia versus bronchitis.  We also      continued the patient's Advair and discharged her home on her      inhalers of Spiriva, Advair and albuterol.  Her primary care      Skylin Kennerson needs to followup with her if this is indeed a COPD      picture.  3. Depression:  We continued Cymbalta 60 mg daily.  4. Tobacco abuse:  We offered the patient smoking cessation; however,      she declined this.  Instead, she stated she will quit on her own.   CONSULTATIONS:  None.   PERTINENT IMAGES:  Chest x-ray, which showed a bilateral infiltrate,  although a repeat x-ray showed improving bilateral infiltrates.   PERTINENT LABS:  Initial CBC showed a white count of 19.6, hemoglobin  11.6, hematocrit 37.3, MCV 65.1, neutrophils 74%, absolute neutrophil  count elevated at 14.5.  Basic metabolic panel was within normal limits  except for a sodium of 134.  Please note, potassium was normal at 3.5,  chloride normal 98, CO2 of 28, BUN 15, creatinine 0.72.  Blood cultures  showed gram-positive  cocci in clusters; however, we feel this is  contaminate, as the patient remained afebrile and improved on the  antibiotic azithromycin.   FOLLOWUP:  For Dr. Elisabeth Most in [place], please follow up the  patient's MCV, which is low.  She may benefit from iron studies, as well  as iron supplements.  Also, please consider a further COPD workup.           ______________________________  Johney Maine, M.D.     JT/MEDQ  D:  11/07/2006  T:  11/07/2006  Job:  [id number]   cc:   Lovenia Meela, D.O.

## 2010-10-13 NOTE — Discharge Summary (Signed)
NAMEBRAILEY, BUESCHER NO.:  [id number]   MEDICAL RECORD NO.:  [id number]          PATIENT TYPE:  INP   LOCATION:  1524                         [facility]ACILITY:  [facility]   PHYSICIAN:  Anselm Pancoast. Weatherly, M.D.DATE OF BIRTH:  Sep 30, 1955   DATE OF ADMISSION:  01/15/2009  DATE OF DISCHARGE:  01/21/2009                               DISCHARGE SUMMARY   DISCHARGE DIAGNOSIS:  Carcinoma of the distal sigmoid colon.   OPERATION:  Sigmoid colectomy with low anterior resection and EEA  anastomosis.   HISTORY/HOSPITAL COURSE:  Shantil Vallejo is a 55 year old Caucasian female  who I first met in the office approximately 4-5 days prior to her  admission here for a sigmoid colectomy.  The patient's history goes as  follows:  She has a history of depression and is on Zoloft.  She has had  problems with bowel irregularity and back in March, she was having  abdominal pain.  CAT scan and ultrasound were performed which showed  gallstones.  She was referred to Dr. Baruch Merl and then had a  laparoscopic cholecystectomy with cholangiogram on August 16, 2008.  She  had a normal cholangiogram and was discharged.  Over the next several  months, has had repeated episodes of cramping abdominal pain which she  describes as in the lower abdomen and has had visits with her physician,  Dr. Marisue Brooklyn, and she has also been seen in the emergency room.  She was saying the operation was August 16, 2008 and she was seen September 10, 2008 in the ER.  She was seen on December 09, 2008 in the ER.  She  describes lower abdominal pain, cramping, nausea and vomiting.  Had  noted that she had blood in her stool she says for 2 years, but had not  been evaluated for this.  Then saw her regular physician, Dr. Marisue Brooklyn, who referred to Dr. Kinnie Scales for a colonoscopy.  I think it had  been presumed that her hemorrhoids were the source of the bleeding.  Since she is on Zoloft, that the cramping bowel irregularity  was  probably secondary to her antidepressants.  However, the patient  had  the colonoscopy by Dr. Kinnie Scales with the findings of a near obstructing  cancer.  He measured it at about 14 cm from the anal verge on the  colonoscopy and it was about 4-5 cm in length.  There were some other  little polyps that were removed.  He was able to go across the tumor.  With the results of the path report, she was referred to me.  She had a  CT in the ER when she was seen in July and on this CT in retrospect,  maybe there are some little subtle changes of thickening in the distal  sigmoid colon, but had not been detected on the original reading.  It is  not something that is obvious in any means.  I saw her in the office and  since the tumor is about 14-15 cm, I first went ahead and re-  proctoscoped her in the  office and noted I definitely have 12 cm of  rectum.  She was just having cramping, bloating, was extremely  constipated and I think that this is not a true rectal cancer, but it is  a very distal sigmoid and I recommended that we go ahead and proceed on  after a mechanical and antibiotic bowel prep to a surgical resection.  She was in agreement.  I think since it is not truly rectal that  preoperative radiation was not needed.  She was having such obstructive  symptoms and I think this has been her symptoms all along with this kind  of cramping, bloating, etc., that proceeding with surgery was indicated.   She was on clear liquids for about 3 days and then we went ahead and did  a GoLYTELY, erythromycin and neomycin bowel prep.  She was admitted for  her surgery on August, 18, 2010.  She was positioned on the OR table.  Her hematocrit is about 36 or 37.  She was taken to surgery.  I  positioned her on the OR table so that I could put her feet up to do an  EEA and anastomosis, but she was as flat as possible during the actual  dissection so that we could get down deep in the pelvis.  When we  first  put her to sleep, I re-proctoscoped her and then could definitely see  the tumor.  She had stool in her rectum when I originally had  proctoscoped and again I get about 13-14 cm, and it is a fairly large  tumor.  We proceeded.  Dr. Gaynelle Adu assisted.  You could feel the  tumor right at the peritoneal reflection area.  Fortunately as had been  noted on her CTs, there was no evidence of any metastasis in her liver.  Her tumor is about the size of someone's fist.  I  went ahead and  transected the sigmoid colon at approximately the proximal two-thirds  and then divided the mesentery to identify the left ureter, and could  kind of open up the retroperitoneal to get her right on the sacral  promontory.  Then after freeing the posterior portion of the uterus and  the vaginal wall, we could kind of pull the area up nicely.  We then  went ahead and got definitely below this.  Then with a Satinsky clamp  across the actual rectum, I then flipped her up and re-proctoscoped her  to make sure that we were definitely below the tumor.  We then put her  back flat and I used a curvilinear contour stapler to go across below  the actual tumor, then transected it and sent the tumor for  pathology  exam.  They said we had about a 6 cm margin distal to the tumor.  The  EEA and anastomosis with 33 mm stapler was performed.  Then the patient  was placed down flat again.  I did proctoscope her after the anastomosis  and there was no bubbling.  Of course, the anastomosis went together  without any excessive tension since her colon had been so stretched  since she has had these obstructive-type symptoms.   The patient postoperatively did have a moderate amount of NG bilious  drainage for the first day or two.  We took her Foley catheter out on  the second postoperative day and she was able to void.  The patient has  been on antibiotics for approximately 3 days and they have been  discontinued.  Her  immediate postoperative CBC showed a white count of  22,000, but that was done really about 8 hours after her completion of  her surgery.  Then the following day, white count was 17 and then her  white count was down to 13.  Her postoperative hemoglobin has dropped,  but she has been not symptomatic.  The last hemoglobin/hematocrit was  done 2 days ago which her hemoglobin is 8.2 and hematocrit of 26.  She  has had normal vital signs and essentially no fever.  Her wound appears  to be healing nicely.  On about the third postoperative day, she started  having bowel function and NG tube was removed.  Then over the last 3  days, her diet has been advanced on up to a select diet at this time.  She is having bowel movements about 3-4 times a day and is ready for  discharged in improved condition at this time.   Her path report fortunately shows no positive lymph nodes.  There were  16 nodes removed in the specimen.  The tumor course does go through the  bowel wall, but appears to be confined to the adipose tissue within it.  The tumor had a 5 cm and 6.5 cm margins and lymph vascular invasion was  focally present.  There were not any tumor deposits noted in the  surrounding peritoneal surfaces and there were none noted at the time of  surgery.  The patient is now ready for discharge.  I do plan on  presenting her to the GYN Cancer Conference.  We will have her seen by a  medical oncologist and possibly radiation therapist.  She is aware that  additional treatment will probably be recommended, but hopefully her  cramping lower abdominal pain and symptoms that she has been putting up  with for 6 months or more will now no longer be present.        Anselm Pancoast. Zachery Dakins, M.D.  Electronically Signed     WJW/MEDQ  D:  01/21/2009  T:  01/21/2009  Job:  [id number]   cc:   Anselm Pancoast. Zachery Dakins, M.D.  [address]  [place] [address]   Lovenia Markan, D.O.  Fax:  [phone]   Griffith Citron, M.D.  Fax: [phone]

## 2010-10-13 NOTE — Op Note (Signed)
NAMERUTA, CAPECE NO.:  [id number]   MEDICAL RECORD NO.:  [id number]          PATIENT TYPE:  INP   LOCATION:  1524                         FACILITY:  [facility]   PHYSICIAN:  Anselm Pancoast. Weatherly, M.D.DATE OF BIRTH:  [birth year]   DATE OF PROCEDURE:  01/15/2009  DATE OF DISCHARGE:                               OPERATIVE REPORT   PREOPERATIVE DIAGNOSIS:  Carcinoma of the distal sigmoid proximal rectal  junction.   POSTOPERATIVE DIAGNOSIS:  Carcinoma of the distal sigmoid proximal  rectal junction.   OPERATION:  Low anterior resection of sigmoid and upper rectum.   ANESTHESIA:  General anesthesia.   SURGEON:  Dr. Consuello Bossier.   ASSISTANT:  Dr. Gaynelle Adu.   HISTORY:  Dorothy Boyd is a 55 year old female who was seen in my office  last week for a history over the last probably 6 months where she has  had intermittent episodes of bloating, cramping and abdominal pain.  She  underwent a laparoscopic cholecystectomy with Dr. Colin Benton back in I think  March when they noted a gallstone.  She is on Zoloft and gives me a  history that she has had blood in her stool intermittently for  approximately 2 years.  I am not sure what they thought the pain was due  on the visits to the ER, but recently she had, had a CT and you could  not see anything obvious abnormal with the exception that she had a lot  of stool in her colon and then her regular medical physician, Dr. Marisue Brooklyn referred her to Dr. Kinnie Scales, who actually did a colonoscopy  approximately 2 weeks ago.  She was found to have a large cancer of the  distal sigmoid and the tumor was nearly obstructing, but not completely  obstructed.  There were several other little polyps in her colon and she  was then referred back to our office.  Dr. Colin Benton is not presently seeing  patients and I saw the patient last Thursday.  I sent her over for a  flat and upright abdominal film and there was a large amount of stool  within the right colon and I started her on a liquid diet, laxatives and  entered her to the OR for an urgent colectomy.  I proctoscoped her in  the office and I could find the tumor when I did the proctoscope,  probably about at least 12 cm from the anal verge, so I should be able  to put her back together.  Since the tumor on the CT and on retrospect  you could see a little abnormality in the distal sigmoid, it is not  truly rectal I think and she is nearly obstructed it would be better to  proceed on with surgery and not consider any type of preoperative  radiation.  The patient was continued on the liquids and laxatives and  had GoLYTELY, erythromycin and neomycin yesterday which she tolerated.  It caused her to have a lot of nausea, but she did complete the bowel  prep.  Her hematocrit is not that abnormal, about  37, and preoperatively  she was given 3 grams the Unasyn and she has PAS stockings.   After identifying the patient was taken back to the operative suite.  I  positioned her on the OR table in the [place] stirrups and put her  legs flat so that we could do proctoscopy and EEA from below and then we  induced her with general anesthesia.  Her abdomen was prepped with  Betadine surgical solution and draped in a sterile manner.  We clipped  the perineal area.  I had proctoscoped her when we first put her to  sleep and the patient has a pretty good bowel prep and we sucked out  anything that was remaining in the rectum.  Then, this time it looks  like that it may be about 11 cm is where the tumor you can see it  easily.  I then made a low midline incision.  The Foley catheter had  been inserted and upon opening the peritoneal cavity, fortunately we  could not see any evidence of any peritoneal studding.  You could easily  feel this tumor.  The tumor itself looked like about 1-2 cm right above  the peritoneal reflection.  She has still got her uterus and I picked  the area kind  of in the proximal third junction area of the sigmoid  colon and went ahead and divided it with a GIA at that area.  The  mesentery was then divided, taking it right on down to the sacral  promontory.  By opening the peritoneum over on the left, I was able to  identify the left ureter and it was out of the field on the opposite  side and I switched to that area.  We saw where the ureter was, but we  were not working that far lateral and did not actually have it exposed  as well as we did on the left side.  Her tubes and ovaries are present,  very atrophic, no evidence of any abnormalities noted.  The mesenteric  was divided using the LigaSure, but the major vessels were tied with 2-0  silks.  The main vessels in the superior hemorrhoidal, etc., inferior  mesenteric and distal branch were doubly tied with 2-0 silk.  On opening  that over the sacral promontory, we could then divide, pulling  everything to the right side and then the distal portion of mesentary  freeded to the actual rectum were divided coming back to the actual  bowel wall.  We opened the peritoneum just behind the cervix and  dissected, kind of getting in the normal tissue plane and then we could  free on up because this area was the closest to where the actual tumor  is.  I had not been able to feel anything in her vagina on examination.  The distal area, I first put a Satinsky across the area below the actual  tumor and then re-proctoscoped her.  We were definitely below the tumor.  It looks like that we got probably 3-4 cm.  I then went back, changed  gowns, put her back flat and then used the contoured stapler to go  across the mid rectal area.  The specimen was removed.  I did send it  for pathology exam.  They said we got a good, about a 6 cm distal margin  of rectum.  We will wait on any path of the lymph nodes.  I elected to  put her together with the  33 Ethicon EEA stapler.  It goes together  easily using 2-0  Prolene for the pursestring proximally and then after  aligning the mesentery we went through just anterior to the staple line.  We got the areas together with the gauge in the mid section, fired the  EEA and then withdrew it easily.  There was two good complete donuts  which I did not send for path exam.  We then actually re-proctoscoped  her.  The anastomosis is probably at about 8 cm and there is no evidence  of bleeding, no bubbling with fluid in the pelvis.  We removed the  fluid.  We then closed the peritoneum.  It looks like we got good  hemostasis.  Then the midline fascia was closed with a looped #1 PDS and  the skin I put a few staples, but also some 3-0 Vicryls king in the  subcutaneous tissue.  The patient tolerated the procedure nicely.  She  has got an NG tube which I going to keep.  Hopefully, we will be able to  get the Foley catheter maybe out tomorrow.  She was sent to the recovery  room in stable postop condition.  I am going to wait until tomorrow  before starting any type of Lovenox and we will check a hematocrit in  the morning.  EBL 200cc Sponge count correct times two.      Anselm Pancoast. Zachery Dakins, M.D.  Electronically Signed     WJW/MEDQ  D:  01/15/2009  T:  01/15/2009  Job:  [id number]   cc:   Lovenia Ramonda, D.O.  Fax: [phone]   Griffith Citron, M.D.  Fax: [phone]

## 2011-03-18 LAB — DIFFERENTIAL
Basophils Relative: 0
Eosinophils Absolute: 0.2
Eosinophils Relative: 1
Lymphocytes Relative: 22
Neutrophils Relative %: 74

## 2011-03-18 LAB — BASIC METABOLIC PANEL
BUN: 15
CO2: 28
GFR calc non Af Amer: >60
Glucose, Bld: 90
Potassium: 3.6

## 2011-03-18 LAB — CBC
HCT: 37.3
MCHC: 31
MCV: 65.1 — ABNORMAL LOW
Platelets: 413 — ABNORMAL HIGH
RDW: 16.6 — ABNORMAL HIGH

## 2011-03-18 LAB — CULTURE, BLOOD (ROUTINE X 2)

## 2012-03-01 ENCOUNTER — Encounter (INDEPENDENT_AMBULATORY_CARE_PROVIDER_SITE_OTHER): Payer: BC Managed Care – PPO | Admitting: Physician Assistant

## 2012-03-28 ENCOUNTER — Ambulatory Visit (HOSPITAL_COMMUNITY)
Admission: RE | Admit: 2012-03-28 | Discharge: 2012-03-28 | Disposition: A | Discharge status: Home / Self Care | Payer: BC Managed Care – PPO | Source: Ambulatory Visit | Attending: Internal Medicine | Admitting: Internal Medicine

## 2012-03-28 ENCOUNTER — Other Ambulatory Visit (HOSPITAL_COMMUNITY): Payer: Self-pay | Admitting: Internal Medicine

## 2012-03-28 DIAGNOSIS — C189 Malignant neoplasm of colon, unspecified: Secondary | ICD-10-CM | POA: Insufficient documentation

## 2012-03-28 DIAGNOSIS — R059 Cough, unspecified: Secondary | ICD-10-CM | POA: Insufficient documentation

## 2012-03-28 DIAGNOSIS — R05 Cough: Secondary | ICD-10-CM

## 2012-03-28 DIAGNOSIS — E119 Type 2 diabetes mellitus without complications: Secondary | ICD-10-CM | POA: Insufficient documentation

## 2012-03-28 DIAGNOSIS — F17200 Nicotine dependence, unspecified, uncomplicated: Secondary | ICD-10-CM | POA: Insufficient documentation

## 2012-08-18 ENCOUNTER — Telehealth: Payer: Self-pay | Admitting: Hematology & Oncology

## 2012-08-18 NOTE — Telephone Encounter (Signed)
LEFT MESSAGE FOR PT TO CALL AND SCHEDULE APPOINTMENT

## 2012-08-18 NOTE — Telephone Encounter (Signed)
Pt aware of 4-21 appointment °

## 2012-08-21 ENCOUNTER — Telehealth: Payer: Self-pay | Admitting: Hematology & Oncology

## 2012-08-21 ENCOUNTER — Other Ambulatory Visit: Payer: Self-pay | Admitting: Medical

## 2012-08-21 NOTE — Telephone Encounter (Signed)
Left pt message to call if she can come in earlier on 4-21 per in basket message

## 2012-09-18 ENCOUNTER — Telehealth: Payer: Self-pay | Admitting: Hematology & Oncology

## 2012-09-18 ENCOUNTER — Other Ambulatory Visit (HOSPITAL_BASED_OUTPATIENT_CLINIC_OR_DEPARTMENT_OTHER): Payer: BC Managed Care – PPO | Admitting: Lab

## 2012-09-18 ENCOUNTER — Ambulatory Visit (HOSPITAL_BASED_OUTPATIENT_CLINIC_OR_DEPARTMENT_OTHER): Payer: BC Managed Care – PPO | Admitting: Medical

## 2012-09-18 ENCOUNTER — Ambulatory Visit: Payer: BC Managed Care – PPO

## 2012-09-18 VITALS — BP 117/65 | HR 77 | Temp 98.3°F | Resp 18 | Wt 165.0 lb

## 2012-09-18 DIAGNOSIS — F17200 Nicotine dependence, unspecified, uncomplicated: Secondary | ICD-10-CM

## 2012-09-18 DIAGNOSIS — Z85038 Personal history of other malignant neoplasm of large intestine: Secondary | ICD-10-CM

## 2012-09-18 DIAGNOSIS — D72829 Elevated white blood cell count, unspecified: Secondary | ICD-10-CM | POA: Insufficient documentation

## 2012-09-18 HISTORY — DX: Personal history of other malignant neoplasm of large intestine: Z85.038

## 2012-09-18 LAB — CBC WITH DIFFERENTIAL (CANCER CENTER ONLY)
Eosinophils Absolute: 0.3 10*3/uL (ref 0.0–0.5)
LYMPH#: 4.5 10*3/uL — ABNORMAL HIGH (ref 0.9–3.3)
LYMPH%: 33.3 % (ref 14.0–48.0)
MCV: 61 fL — ABNORMAL LOW (ref 81–101)
MONO#: 0.5 10*3/uL (ref 0.1–0.9)
NEUT#: 8.1 10*3/uL — ABNORMAL HIGH (ref 1.5–6.5)
Platelets: 357 10*3/uL (ref 145–400)
RBC: 6.37 10*6/uL — ABNORMAL HIGH (ref 3.70–5.32)
WBC: 13.5 10*3/uL — ABNORMAL HIGH (ref 3.9–10.0)

## 2012-09-18 LAB — CHCC SATELLITE - SMEAR

## 2012-09-18 NOTE — Telephone Encounter (Signed)
Mailed [phone] schedule

## 2012-09-18 NOTE — Progress Notes (Signed)
Diagnoses: #1.  History of stage II (T3, N0, M0) adenocarcinoma, sigmoid colon. #2.  Reactive leukocytosis.  Current therapy: Observation and surveillance.  Interim history: Dorothy Boyd presents today for an office followup visit.  We have not seen her since December of 2011.  Apparently, she was having some insurance problems, and has been unable to keep her follow up appointments.  We had previously been seeing her for observation and surveillance regarding her history of stage II adenocarcinoma of the sigmoid colon.  Her most recent colonoscopy was November 2013.  She states, that she had one polyp, removed and it was benign.  I do not have this report, in hand.  She states this was done by Dr. Kinnie Scales.  Her last CT scan was may, 2011, which was negative for evidence of recurrence.  She's not reported any new problems.  Her visit.  Today is actually for reactive leukocytosis.  Apparently, she was seen by her primary care physician, who noted her white count to be elevated at 15.5.  Her absolute lymphs were 5.1, and absolute neutrophils 9.0.  Her hemoglobin and hematocrit and platelets are within normal limits.  Of note, she does have diabetes, that is uncontrolled.  She does smoke.  In looking back through her notes, it appears, that she had a bone marrow, biopsy done by Dr. Welton Flakes in November of 2005 which showed mild leukocytosis associated with reactive-appearing lymphocytes.  The marrow with slightly hypercellular with trilineage hematopoiesis and nonspecific changes.  Flow cytometry analysis failed to show any myocardial B-cell population, or abnormal T-cell phenotype.  It appears, that 2 years ago her white count was 12.6.  She's not reported any unintentional weight loss, or weight, gain.  She's not reported any palpable, lymph nodes.  She's not reporting any abnormal, night sweats, or fevers.  She does report, a good appetite.  She does have symptoms of IBS, including, constipation, and diarrhea.  She  does report some intermittent nausea.  Again, she does followup with GI.  She denies any fevers, chills, night sweats, chest pain, shortness of breath, or cough.  She denies any abdominal pain, any lower leg swelling.  She denies any obvious, or abnormal bleeding to include, melena, or hematochezia.  She denies any headaches, visual changes, or rashes.  Review of Systems: Constitutional:Negative for malaise/fatigue, fever, chills, weight loss, diaphoresis, activity change, appetite change, and unexpected weight change.  HEENT: Negative for double vision, blurred vision, visual loss, ear pain, tinnitus, congestion, rhinorrhea, epistaxis sore throat or sinus disease, oral pain/lesion, tongue soreness Respiratory: Negative for cough, chest tightness, shortness of breath, wheezing and stridor.  Cardiovascular: Negative for chest pain, palpitations, leg swelling, orthopnea, PND, DOE or claudication Gastrointestinal: Negative for nausea, vomiting, abdominal pain, diarrhea, constipation, blood in stool, melena, hematochezia, abdominal distention, anal bleeding, rectal pain, anorexia and hematemesis.  Genitourinary: Negative for dysuria, frequency, hematuria,  Musculoskeletal: Negative for myalgias, back pain, joint swelling, arthralgias and gait problem.  Skin: Negative for rash, color change, pallor and wound.  Neurological:. Negative for dizziness/light-headedness, tremors, seizures, syncope, facial asymmetry, speech difficulty, weakness, numbness, headaches and paresthesias.  Hematological: Negative for adenopathy. Does not bruise/bleed easily.  Psychiatric/Behavioral:  Negative for depression, no loss of interest in normal activity or change in sleep pattern.   Physical Exam: This is a 57 year old, well-developed, well-nourished, white female, in no obvious distress Vitals: Temperature 98.3 degrees, pulse 77, respirations 18, blood pressure 117/65.  Weight 165 pounds HEENT reveals a normocephalic,  atraumatic skull, no scleral icterus,  no oral lesions  Neck is supple without any cervical or supraclavicular adenopathy.  Lungs are clear to auscultation bilaterally. There are no wheezes, rales or rhonci Cardiac is regular rate and rhythm with a normal S1 and S2. There are no murmurs, rubs, or bruits.  Abdomen is soft with good bowel sounds, there is no palpable mass. There is no palpable hepatosplenomegaly. There is no palpable fluid wave.  Musculoskeletal no tenderness of the spine, ribs, or hips.  Extremities there are no clubbing, cyanosis, or edema.  Skin no petechia, purpura or ecchymosis Neurologic is nonfocal.  Laboratory Data: White count 13.5, hemoglobin 12.7, hematocrit 38.9, platelets 357,000  Peripheral smear  shows a well defined population of red blood cells. There is no  polychromasia. I do not see any nucleated red blood cells. There are  no schistocytes. She had no rouleaux formation. I saw no target cells.  There are no sickle cells. Her white cells appear normal in morphology  and maturation. She has no hypersegmented polys. There are no immature  myeloid or lymphoid forms.  I do not see any blasts. Platelets were well-  Granulated and normal size.  Current Outpatient Prescriptions on File Prior to Visit  Medication Sig Dispense Refill  . Montelukast Sodium (SINGULAIR PO) Take 1 tablet by mouth daily.      . sertraline (ZOLOFT) 100 MG tablet Take 100 mg by mouth daily.      Marland Kitchen UNABLE TO FIND Hydrocodone? Pt taking 1 tab qhs prn       No current facility-administered medications on file prior to visit.   Assessment/Plan: This is a 57 year old, white female, with the following issues:  #1.  History of stage II adenocarcinoma of the sigmoid colon, no evidence of recurrence.  She did complete chemotherapy.  Her last one the was November 2013, with Dr. Kinnie Scales.  According to the, patient this was negative.  Again, I do not have that report in hand.  #2.  Reactive  leukocytosis.  She is diabetic.  She is a smoker.  Her peripheral smear did not reveal any abnormalities.  There is no evidence of any type of lymphoma or leukemia.  There is currently no indication for any type of intervention at this time.  #3.  Followup.  We will follow back up with Dorothy Boyd in one year, but before then should there be questions or concerns.

## 2012-11-29 NOTE — Progress Notes (Signed)
This encounter was created in error - please disregard.

## 2013-04-05 ENCOUNTER — Other Ambulatory Visit: Payer: Self-pay

## 2013-05-12 ENCOUNTER — Other Ambulatory Visit: Payer: Self-pay | Admitting: Emergency Medicine

## 2013-05-17 ENCOUNTER — Other Ambulatory Visit: Payer: Self-pay | Admitting: Emergency Medicine

## 2013-05-17 MED ORDER — SERTRALINE HCL 100 MG PO TABS: 100.0000 mg | ORAL_TABLET | Freq: Every day | ORAL | Status: DC

## 2013-05-17 MED ORDER — CANAGLIFLOZIN 300 MG PO TABS: 300.0000 mg | ORAL_TABLET | Freq: Every day | ORAL | Status: DC

## 2013-06-24 ENCOUNTER — Encounter: Payer: Self-pay | Admitting: *Deleted

## 2013-06-24 DIAGNOSIS — C189 Malignant neoplasm of colon, unspecified: Secondary | ICD-10-CM | POA: Insufficient documentation

## 2013-06-24 DIAGNOSIS — E1169 Type 2 diabetes mellitus with other specified complication: Secondary | ICD-10-CM | POA: Insufficient documentation

## 2013-06-24 DIAGNOSIS — E785 Hyperlipidemia, unspecified: Secondary | ICD-10-CM

## 2013-06-24 DIAGNOSIS — E559 Vitamin D deficiency, unspecified: Secondary | ICD-10-CM | POA: Insufficient documentation

## 2013-06-24 DIAGNOSIS — F329 Major depressive disorder, single episode, unspecified: Secondary | ICD-10-CM

## 2013-06-24 DIAGNOSIS — F32A Depression, unspecified: Secondary | ICD-10-CM

## 2013-06-24 DIAGNOSIS — E119 Type 2 diabetes mellitus without complications: Secondary | ICD-10-CM | POA: Insufficient documentation

## 2013-06-24 DIAGNOSIS — F419 Anxiety disorder, unspecified: Secondary | ICD-10-CM | POA: Insufficient documentation

## 2013-06-26 ENCOUNTER — Other Ambulatory Visit: Payer: Self-pay | Admitting: Emergency Medicine

## 2013-06-26 ENCOUNTER — Other Ambulatory Visit (HOSPITAL_COMMUNITY)
Admission: RE | Admit: 2013-06-26 | Discharge: 2013-06-26 | Disposition: A | Discharge status: Home / Self Care | Payer: BC Managed Care – PPO | Source: Ambulatory Visit | Attending: Internal Medicine | Admitting: Internal Medicine

## 2013-06-26 ENCOUNTER — Ambulatory Visit (INDEPENDENT_AMBULATORY_CARE_PROVIDER_SITE_OTHER): Payer: BC Managed Care – PPO | Admitting: Emergency Medicine

## 2013-06-26 ENCOUNTER — Encounter: Payer: Self-pay | Admitting: Emergency Medicine

## 2013-06-26 VITALS — BP 106/64 | HR 78 | Temp 98.2°F | Resp 18 | Ht 62.5 in | Wt 163.0 lb

## 2013-06-26 DIAGNOSIS — Z01419 Encounter for gynecological examination (general) (routine) without abnormal findings: Secondary | ICD-10-CM | POA: Insufficient documentation

## 2013-06-26 DIAGNOSIS — E559 Vitamin D deficiency, unspecified: Secondary | ICD-10-CM

## 2013-06-26 DIAGNOSIS — Z79899 Other long term (current) drug therapy: Secondary | ICD-10-CM

## 2013-06-26 DIAGNOSIS — I10 Essential (primary) hypertension: Secondary | ICD-10-CM

## 2013-06-26 DIAGNOSIS — R110 Nausea: Secondary | ICD-10-CM

## 2013-06-26 DIAGNOSIS — Z0000 Encounter for general adult medical examination without abnormal findings: Secondary | ICD-10-CM

## 2013-06-26 DIAGNOSIS — Z124 Encounter for screening for malignant neoplasm of cervix: Secondary | ICD-10-CM

## 2013-06-26 LAB — CBC WITH DIFFERENTIAL/PLATELET
BASOS ABS: 0.1 10*3/uL (ref 0.0–0.1)
Basophils Relative: 0 % (ref 0–1)
Eosinophils Absolute: 0.3 10*3/uL (ref 0.0–0.7)
Eosinophils Relative: 2 % (ref 0–5)
HEMATOCRIT: 41.1 % (ref 36.0–46.0)
HEMOGLOBIN: 13.6 g/dL (ref 12.0–15.0)
LYMPHS ABS: 5.5 10*3/uL — AB (ref 0.7–4.0)
LYMPHS PCT: 38 % (ref 12–46)
MCH: 19.8 pg — ABNORMAL LOW (ref 26.0–34.0)
MCHC: 33.1 g/dL (ref 30.0–36.0)
MCV: 59.9 fL — ABNORMAL LOW (ref 78.0–100.0)
MONO ABS: 0.6 10*3/uL (ref 0.1–1.0)
Monocytes Relative: 4 % (ref 3–12)
NEUTROS ABS: 8.1 10*3/uL — AB (ref 1.7–7.7)
Neutrophils Relative %: 56 % (ref 43–77)
Platelets: 345 10*3/uL (ref 150–400)
RBC: 6.86 MIL/uL — AB (ref 3.87–5.11)
RDW: 17 % — AB (ref 11.5–15.5)
WBC: 14.5 10*3/uL — AB (ref 4.0–10.5)

## 2013-06-26 MED ORDER — PROMETHAZINE HCL 25 MG PO TABS: 25.0000 mg | ORAL_TABLET | Freq: Four times a day (QID) | ORAL | Status: DC | PRN

## 2013-06-26 MED ORDER — NYSTATIN 100000 UNIT/GM EX CREA: 1.0000 "application " | TOPICAL_CREAM | Freq: Two times a day (BID) | CUTANEOUS | Status: DC

## 2013-06-26 MED ORDER — FLUCONAZOLE 150 MG PO TABS: 150.0000 mg | ORAL_TABLET | ORAL | Status: DC

## 2013-06-26 NOTE — Patient Instructions (Signed)
Fat and Cholesterol Control Diet Fat and cholesterol levels in your blood and organs are influenced by your diet. High levels of fat and cholesterol may lead to diseases of the heart, small and large blood vessels, gallbladder, liver, and pancreas. CONTROLLING FAT AND CHOLESTEROL WITH DIET Although exercise and lifestyle factors are important, your diet is key. That is because certain foods are known to raise cholesterol and others to lower it. The goal is to balance foods for their effect on cholesterol and more importantly, to replace saturated and trans fat with other types of fat, such as monounsaturated fat, polyunsaturated fat, and omega-3 fatty acids. On average, a person should consume no more than 15 to 17 g of saturated fat daily. Saturated and trans fats are considered "bad" fats, and they will raise LDL cholesterol. Saturated fats are primarily found in animal products such as meats, butter, and cream. However, that does not mean you need to give up all your favorite foods. Today, there are good tasting, low-fat, low-cholesterol substitutes for most of the things you like to eat. Choose low-fat or nonfat alternatives. Choose round or loin cuts of red meat. These types of cuts are lowest in fat and cholesterol. Chicken (without the skin), fish, veal, and ground turkey breast are great choices. Eliminate fatty meats, such as hot dogs and salami. Even shellfish have little or no saturated fat. Have a 3 oz (85 g) portion when you eat lean meat, poultry, or fish. Trans fats are also called "partially hydrogenated oils." They are oils that have been scientifically manipulated so that they are solid at room temperature resulting in a longer shelf life and improved taste and texture of foods in which they are added. Trans fats are found in stick margarine, some tub margarines, cookies, crackers, and baked goods.  When baking and cooking, oils are a great substitute for butter. The monounsaturated oils are  especially beneficial since it is believed they lower LDL and raise HDL. The oils you should avoid entirely are saturated tropical oils, such as coconut and palm.  Remember to eat a lot from food groups that are naturally free of saturated and trans fat, including fish, fruit, vegetables, beans, grains (barley, rice, couscous, bulgur wheat), and pasta (without cream sauces).  IDENTIFYING FOODS THAT LOWER FAT AND CHOLESTEROL  Soluble fiber may lower your cholesterol. This type of fiber is found in fruits such as apples, vegetables such as broccoli, potatoes, and carrots, legumes such as beans, peas, and lentils, and grains such as barley. Foods fortified with plant sterols (phytosterol) may also lower cholesterol. You should eat at least 2 g per day of these foods for a cholesterol lowering effect.  Read package labels to identify low-saturated fats, trans fat free, and low-fat foods at the supermarket. Select cheeses that have only 2 to 3 g saturated fat per ounce. Use a heart-healthy tub margarine that is free of trans fats or partially hydrogenated oil. When buying baked goods (cookies, crackers), avoid partially hydrogenated oils. Breads and muffins should be made from whole grains (whole-wheat or whole oat flour, instead of "flour" or "enriched flour"). Buy non-creamy canned soups with reduced salt and no added fats.  FOOD PREPARATION TECHNIQUES  Never deep-fry. If you must fry, either stir-fry, which uses very little fat, or use non-stick cooking sprays. When possible, broil, bake, or roast meats, and steam vegetables. Instead of putting butter or margarine on vegetables, use lemon and herbs, applesauce, and cinnamon (for squash and sweet potatoes). Use nonfat   yogurt, salsa, and low-fat dressings for salads.  LOW-SATURATED FAT / LOW-FAT FOOD SUBSTITUTES Meats / Saturated Fat (g)  Avoid: Steak, marbled (3 oz/85 g) / 11 g  Choose: Steak, lean (3 oz/85 g) / 4 g  Avoid: Hamburger (3 oz/85 g) / 7  g  Choose: Hamburger, lean (3 oz/85 g) / 5 g  Avoid: Ham (3 oz/85 g) / 6 g  Choose: Ham, lean cut (3 oz/85 g) / 2.4 g  Avoid: Chicken, with skin, dark meat (3 oz/85 g) / 4 g  Choose: Chicken, skin removed, dark meat (3 oz/85 g) / 2 g  Avoid: Chicken, with skin, light meat (3 oz/85 g) / 2.5 g  Choose: Chicken, skin removed, light meat (3 oz/85 g) / 1 g Dairy / Saturated Fat (g)  Avoid: Whole milk (1 cup) / 5 g  Choose: Low-fat milk, 2% (1 cup) / 3 g  Choose: Low-fat milk, 1% (1 cup) / 1.5 g  Choose: Skim milk (1 cup) / 0.3 g  Avoid: Hard cheese (1 oz/28 g) / 6 g  Choose: Skim milk cheese (1 oz/28 g) / 2 to 3 g  Avoid: Cottage cheese, 4% fat (1 cup) / 6.5 g  Choose: Low-fat cottage cheese, 1% fat (1 cup) / 1.5 g  Avoid: Ice cream (1 cup) / 9 g  Choose: Sherbet (1 cup) / 2.5 g  Choose: Nonfat frozen yogurt (1 cup) / 0.3 g  Choose: Frozen fruit bar / trace  Avoid: Whipped cream (1 tbs) / 3.5 g  Choose: Nondairy whipped topping (1 tbs) / 1 g Condiments / Saturated Fat (g)  Avoid: Mayonnaise (1 tbs) / 2 g  Choose: Low-fat mayonnaise (1 tbs) / 1 g  Avoid: Butter (1 tbs) / 7 g  Choose: Extra light margarine (1 tbs) / 1 g  Avoid: Coconut oil (1 tbs) / 11.8 g  Choose: Olive oil (1 tbs) / 1.8 g  Choose: Corn oil (1 tbs) / 1.7 g  Choose: Safflower oil (1 tbs) / 1.2 g  Choose: Sunflower oil (1 tbs) / 1.4 g  Choose: Soybean oil (1 tbs) / 2.4 g  Choose: Canola oil (1 tbs) / 1 g Document Released: 05/17/2005 Document Revised: 09/11/2012 Document Reviewed: 11/05/2010 [employer] Patient Information 2014 [place], [place]. Smoking Cessation, Tips for Success If you are ready to quit smoking, congratulations! You have chosen to help yourself be healthier. Cigarettes bring nicotine, tar, carbon monoxide, and other irritants into your body. Your lungs, heart, and blood vessels will be able to work better without these poisons. There are many different ways to quit smoking.  Nicotine gum, nicotine patches, a nicotine inhaler, or nicotine nasal spray can help with physical craving. Hypnosis, support groups, and medicines help break the habit of smoking. WHAT THINGS CAN I DO TO MAKE QUITTING EASIER?  Here are some tips to help you quit for good:  Pick a date when you will quit smoking completely. Tell all of your friends and family about your plan to quit on that date.  Do not try to slowly cut down on the number of cigarettes you are smoking. Pick a quit date and quit smoking completely starting on that day.  Throw away all cigarettes.   Clean and remove all ashtrays from your home, work, and car.   On a card, write down your reasons for quitting. Carry the card with you and read it when you get the urge to smoke.   Cleanse your body of nicotine. Drink  enough water and fluids to keep your urine clear or pale yellow. Do this after quitting to flush the nicotine from your body.   Learn to predict your moods. Do not let a bad situation be your excuse to have a cigarette. Some situations in your life might tempt you into wanting a cigarette.   Never have "just one" cigarette. It leads to wanting another and another. Remind yourself of your decision to quit.   Change habits associated with smoking. If you smoked while driving or when feeling stressed, try other activities to replace smoking. Stand up when drinking your coffee. Brush your teeth after eating. Sit in a different chair when you read the paper. Avoid alcohol while trying to quit, and try to drink fewer caffeinated beverages. Alcohol and caffeine may urge you to smoke.   Avoid foods and drinks that can trigger a desire to smoke, such as sugary or spicy foods and alcohol.   Ask people who smoke not to smoke around you.   Have something planned to do right after eating or having a cup of coffee. For example, plan to take a walk or exercise.   Try a relaxation exercise to calm you down and  decrease your stress. Remember, you may be tense and nervous for the first 2 weeks after you quit, but this will pass.   Find new activities to keep your hands busy. Play with a pen, coin, or rubber band. Doodle or draw things on paper.   Brush your teeth right after eating. This will help cut down on the craving for the taste of tobacco after meals. You can also try mouthwash.   Use oral substitutes in place of cigarettes. Try using lemon drops, carrots, cinnamon sticks, or chewing gum. Keep them handy so they are available when you have the urge to smoke.   When you have the urge to smoke, try deep breathing.   Designate your home as a nonsmoking area.   If you are a heavy smoker, ask your health care provider about a prescription for nicotine chewing gum. It can ease your withdrawal from nicotine.   Reward yourself. Set aside the cigarette money you save and buy yourself something nice.   Look for support from others. Join a support group or smoking cessation program. Ask someone at home or at work to help you with your plan to quit smoking.   Always ask yourself, "Do I need this cigarette or is this just a reflex?" Tell yourself, "Today, I choose not to smoke," or "I do not want to smoke." You are reminding yourself of your decision to quit.  Do not replace cigarette smoking with electronic cigarettes (commonly called e-cigarettes). The safety of e-cigarettes is unknown, and some may contain harmful chemicals.  If you relapse, do not give up! Plan ahead and think about what you will do the next time you get the urge to smoke.  HOW WILL I FEEL WHEN I QUIT SMOKING? You may have symptoms of withdrawal because your body is used to nicotine (the addictive substance in cigarettes). You may crave cigarettes, be irritable, feel very hungry, cough often, get headaches, or have difficulty concentrating. The withdrawal symptoms are only temporary. They are strongest when you first quit but  will go away within 10 14 days. When withdrawal symptoms occur, stay in control. Think about your reasons for quitting. Remind yourself that these are signs that your body is healing and getting used to being without cigarettes. Remember that  withdrawal symptoms are easier to treat than the major diseases that smoking can cause.  Even after the withdrawal is over, expect periodic urges to smoke. However, these cravings are generally short lived and will go away whether you smoke or not. Do not smoke!  WHAT RESOURCES ARE AVAILABLE TO HELP ME QUIT SMOKING? Your health care provider can direct you to community resources or hospitals for support, which may include:  Group support.  Education.  Hypnosis.  Therapy. Document Released: 02/13/2004 Document Revised: 03/07/2013 Document Reviewed: 11/02/2012 [facility] Patient Information 2014 [place], [place].

## 2013-06-27 LAB — MICROALBUMIN / CREATININE URINE RATIO
Creatinine, Urine: 45.9 mg/dL
MICROALB UR: 0.5 mg/dL (ref 0.00–1.89)
Microalb Creat Ratio: 10.9 mg/g (ref 0.0–30.0)

## 2013-06-27 LAB — HEPATIC FUNCTION PANEL
ALBUMIN: 4.9 g/dL (ref 3.5–5.2)
ALT: 19 U/L (ref 0–35)
AST: 14 U/L (ref 0–37)
Alkaline Phosphatase: 100 U/L (ref 39–117)
Bilirubin, Direct: 0.1 mg/dL (ref 0.0–0.3)
Indirect Bilirubin: 0.5 mg/dL (ref 0.0–0.9)
TOTAL PROTEIN: 8 g/dL (ref 6.0–8.3)
Total Bilirubin: 0.6 mg/dL (ref 0.3–1.2)

## 2013-06-27 LAB — URINALYSIS, ROUTINE W REFLEX MICROSCOPIC
Bilirubin Urine: NEGATIVE
Glucose, UA: >1000 mg/dL — AB
HGB URINE DIPSTICK: NEGATIVE
KETONES UR: NEGATIVE mg/dL
NITRITE: NEGATIVE
Protein, ur: NEGATIVE mg/dL
SPECIFIC GRAVITY, URINE: 1.016 (ref 1.005–1.030)
UROBILINOGEN UA: 0.2 mg/dL (ref 0.0–1.0)
pH: 5 (ref 5.0–8.0)

## 2013-06-27 LAB — URINALYSIS, MICROSCOPIC ONLY
BACTERIA UA: NONE SEEN
CASTS: NONE SEEN
CRYSTALS: NONE SEEN
Squamous Epithelial / LPF: NONE SEEN

## 2013-06-27 LAB — BASIC METABOLIC PANEL WITH GFR
BUN: 15 mg/dL (ref 6–23)
CHLORIDE: 98 meq/L (ref 96–112)
CO2: 26 meq/L (ref 19–32)
Calcium: 9.9 mg/dL (ref 8.4–10.5)
Creat: 0.74 mg/dL (ref 0.50–1.10)
GFR, EST NON AFRICAN AMERICAN: 89 mL/min
GFR, Est African American: >89 mL/min
GLUCOSE: 130 mg/dL — AB (ref 70–99)
POTASSIUM: 4.2 meq/L (ref 3.5–5.3)
Sodium: 134 mEq/L — ABNORMAL LOW (ref 135–145)

## 2013-06-27 LAB — LIPID PANEL
CHOLESTEROL: 225 mg/dL — AB (ref 0–200)
HDL: 27 mg/dL — ABNORMAL LOW (ref >39)
LDL Cholesterol: 126 mg/dL — ABNORMAL HIGH (ref 0–99)
TRIGLYCERIDES: 359 mg/dL — AB (ref <150)
Total CHOL/HDL Ratio: 8.3 Ratio
VLDL: 72 mg/dL — ABNORMAL HIGH (ref 0–40)

## 2013-06-27 LAB — VITAMIN D 25 HYDROXY (VIT D DEFICIENCY, FRACTURES): VIT D 25 HYDROXY: 25 ng/mL — AB (ref 30–89)

## 2013-06-27 LAB — VITAMIN B12: Vitamin B-12: 463 pg/mL (ref 211–911)

## 2013-06-27 LAB — HEMOGLOBIN A1C
Hgb A1c MFr Bld: 8.8 % — ABNORMAL HIGH (ref <5.7)
MEAN PLASMA GLUCOSE: 206 mg/dL — AB (ref <117)

## 2013-06-27 LAB — INSULIN, FASTING: Insulin fasting, serum: 14 u[IU]/mL (ref 3–28)

## 2013-06-27 LAB — MAGNESIUM: MAGNESIUM: 2.2 mg/dL (ref 1.5–2.5)

## 2013-06-27 LAB — TSH: TSH: 3.241 u[IU]/mL (ref 0.350–4.500)

## 2013-06-28 LAB — URINE CULTURE: Colony Count: 3000

## 2013-07-01 ENCOUNTER — Encounter: Payer: Self-pay | Admitting: Emergency Medicine

## 2013-07-01 NOTE — Progress Notes (Addendum)
Subjective:    Patient ID: Dorothy Boyd, female    DOB: 01/03/1956, 58 y.o.   MRN: [id number]  HPI Comments: 58 yo WF CPE non-compliant with DM and cholesterol. She also presents for 3 month F/U for HTN, Cholesterol, DM, D. Deficient. She is trying to improve diet with decreased portions and carbs. She is not exercising with cardio but keeps busy. She does not check BP at home and occasionally checks BS, which has improved some. She has not d/c tobacco AD previously. She notes her BS has been better on Farxiga and she could not tolerate Invokana. She is aware of risks of cancer with tobacco/ medications/ uncontrolled DM.She gets yeast infection with both medicines but notes it has improved recently.  She notes she is feeling better overall with the better BS control. She refuses Metformin due to GI UPset. She refuses Chol Rx due to myalgias despite risk. LAST LABS T 254 TG 386 H 33 L 144 A1C 8.5 INSUL 14 B12 305 D 25  She was diagnosed with colon cancer stage 2 sigmoid adenocarcinoma in 7/ 2010 and treated with Chemo therapy by Dr Earlean Shawl. She has had repeat colonoscopy 03/2012 with one polyp removed. She denies blood or stool changes but notes nausea and low abdomen pain. Last CT abd/Pelvis 09/2009 was negative for recurrence.  She denies unexplained wt loss.  She has abnormal CBC for over  Months with repeat evaluation by hematology 09/18/12 diagnosis with reactive Leukocytosis and advised recheck 1 year. LAST WBC 16.1 PRIOR 17.4 RBC 6.5 PRIOR 6.3 MCV 58.8 MCH 19.5RDW 16.8 ABS GRAN 9.6 ABS LYMPH 5.6   Diabetes Associated symptoms include fatigue.  Anxiety Symptoms include nausea.      Current Outpatient Prescriptions on File Prior to Visit  Medication Sig Dispense Refill  . Dapagliflozin Propanediol 10 MG TABS Take by mouth daily.      . sertraline (ZOLOFT) 100 MG tablet Take 1 tablet (100 mg total) by mouth daily.  90 tablet  1  . b complex vitamins tablet Take 1 tablet by mouth daily.       . Canagliflozin (INVOKANA) 300 MG TABS Take 1 tablet (300 mg total) by mouth daily.  30 tablet  0  . cholecalciferol (VITAMIN D) 1000 UNITS tablet Take 3,000 Units by mouth daily.      Marland Kitchen gabapentin (NEURONTIN) 100 MG capsule Take 100 mg by mouth daily.      . hyoscyamine (ANASPAZ) 0.125 MG TBDP Place 0.125 mg under the tongue.      . Montelukast Sodium (SINGULAIR PO) Take 1 tablet by mouth daily.      Marland Kitchen UNABLE TO FIND Hydrocodone? Pt taking 1 tab qhs prn       No current facility-administered medications on file prior to visit.   ALLERGIES Codeine; Lunesta; Metformin and related; and Welchol  Past Medical History  Diagnosis Date  . Hyperlipidemia   . Depression   . Anxiety   . Vitamin D deficiency   . Colon cancer   . Diabetes   . Cervical dysplasia    Past Surgical History  Procedure Laterality Date  . Appendectomy     History  Substance Use Topics  . Smoking status: Current Every Day Smoker  . Smokeless tobacco: Not on file  . Alcohol Use: No   Family History  Problem Relation Age of Onset  . Cancer Mother     ovarian  . Hyperlipidemia Mother   . Hypertension Mother   . Diabetes Sister   .  Hyperlipidemia Sister   . Heart disease Brother      Review of Systems  Constitutional: Positive for fatigue.  Gastrointestinal: Positive for nausea and abdominal pain.       MEDOFF- COLONOSCOPY 04/13/12 TUBULAR ADENOMA DUE 2016  Genitourinary:       OVERDUE mammo and pap   Hematological:       ENNEVER DUE 08/2013  All other systems reviewed and are negative.   BP 106/64  Pulse 78  Temp(Src) 98.2 F (36.8 C) (Temporal)  Resp 18  Ht 5' 2.5" (1.588 m)  Wt 163 lb (73.936 kg)  BMI 29.32 kg/m2     Objective:   Physical Exam  Nursing note and vitals reviewed. Constitutional: She is oriented to person, place, and time. She appears well-developed and well-nourished. No distress.  HENT:  Head: Normocephalic and atraumatic.  Right Ear: External ear normal.  Left  Ear: External ear normal.  Nose: Nose normal.  Mouth/Throat: No oropharyngeal exudate.  Eyes: Conjunctivae and EOM are normal. Pupils are equal, round, and reactive to light. Right eye exhibits no discharge. Left eye exhibits no discharge. No scleral icterus.  Neck: Normal range of motion. Neck supple. No JVD present. No tracheal deviation present. No thyromegaly present.  Cardiovascular: Normal rate, regular rhythm, normal heart sounds and intact distal pulses.   Pulmonary/Chest: Effort normal and breath sounds normal.  Abdominal: Soft. Bowel sounds are normal. She exhibits no distension and no mass. There is tenderness. There is no rebound and no guarding.  Minimal diffuse  Genitourinary: Vagina normal and uterus normal. No vaginal discharge found.  Breasts WNL- FIbrocystic  Musculoskeletal: Normal range of motion. She exhibits no edema and no tenderness.  Lymphadenopathy:    She has no cervical adenopathy.  Neurological: She is alert and oriented to person, place, and time. She has normal reflexes. No cranial nerve deficit. She exhibits normal muscle tone. Coordination normal.  Skin: Skin is warm and dry. No rash noted. No erythema. No pallor.     Psychiatric: She has a normal mood and affect. Her behavior is normal. Judgment and thought content normal.      AORTA SCAN- WNL EKG NSCSPT WNL    Assessment & Plan:  1. CPE and 3 month F/U for HTN, Cholesterol,DM, D. Deficient. Needs healthy diet, cardio QD and obtain healthy weight. Check Labs, Check BP if >130/80 call office, Check BS if >200 call office. Update History/ EKG/ PAP/ Mammo/ CXR/ Immunizations/ Aorta scan. Advised patient to schedule mammo/ advised needs eye exam with opthamologist with DM HX 2. Tobacco dependence- Cessation discussed 3. Abdomen pain/ NAUSEA WITH hx OF COLON CANCER AND ABNORMAL CBC WIILL GET CT ABD/ Pelvis to evaluate 4. Irreg Nevi- She needs to schedule removal

## 2013-07-03 ENCOUNTER — Encounter: Payer: Self-pay | Admitting: Emergency Medicine

## 2013-07-09 NOTE — Addendum Note (Signed)
Addended by: Kelby Aline R on: 07/09/2013 08:36 AM   Modules accepted: Orders

## 2013-07-26 ENCOUNTER — Ambulatory Visit: Payer: Self-pay | Admitting: Emergency Medicine

## 2013-07-31 ENCOUNTER — Encounter: Payer: Self-pay | Admitting: Emergency Medicine

## 2013-07-31 ENCOUNTER — Ambulatory Visit (INDEPENDENT_AMBULATORY_CARE_PROVIDER_SITE_OTHER): Payer: BC Managed Care – PPO | Admitting: Emergency Medicine

## 2013-07-31 VITALS — BP 112/80 | HR 98 | Temp 98.2°F | Resp 16 | Ht 62.5 in | Wt 163.0 lb

## 2013-07-31 DIAGNOSIS — F3289 Other specified depressive episodes: Secondary | ICD-10-CM

## 2013-07-31 DIAGNOSIS — E782 Mixed hyperlipidemia: Secondary | ICD-10-CM

## 2013-07-31 DIAGNOSIS — F32A Depression, unspecified: Secondary | ICD-10-CM

## 2013-07-31 DIAGNOSIS — F329 Major depressive disorder, single episode, unspecified: Secondary | ICD-10-CM

## 2013-07-31 DIAGNOSIS — E119 Type 2 diabetes mellitus without complications: Secondary | ICD-10-CM

## 2013-07-31 DIAGNOSIS — I10 Essential (primary) hypertension: Secondary | ICD-10-CM

## 2013-07-31 MED ORDER — LEVOMILNACIPRAN HCL ER 20 MG PO CP24: ORAL_CAPSULE | ORAL | Status: DC

## 2013-07-31 MED ORDER — LEVOMILNACIPRAN HCL ER 40 MG PO CP24: ORAL_CAPSULE | ORAL | Status: DC

## 2013-07-31 NOTE — Patient Instructions (Signed)
Mood Disorders  Mood disorders are conditions that affect the way a person feels emotionally. The main mood disorders include:  · Depression.  · Bipolar disorder.  · Dysthymia. Dysthymia is a mild, lasting (chronic) depression. Symptoms of dysthymia are similar to depression, but not as severe.  · Cyclothymia. Cyclothymia includes mood swings, but the highs and lows are not as severe as they are in bipolar disorder. Symptoms of cyclothymia are similar to those of bipolar disorder, but less extreme.  CAUSES   Mood disorders are probably caused by a combination of factors. People with mood disorders seem to have physical and chemical changes in their brains. Mood disorders run in families, so there may be genetic causes. Severe trauma or stressful life events may also increase the risk of mood disorders.   SYMPTOMS   Symptoms of mood disorders depend on the specific type of condition.  Depression symptoms include:  · Feeling sad, worthless, or hopeless.  · Negative thoughts.  · Inability to enjoy one's usual activities.  · Low energy.  · Sleeping too much or too little.  · Appetite changes.  · Crying.  · Concentration problems.  · Thoughts of harming oneself.  Bipolar disorder symptoms include:  · Periods of depression (see above symptoms).  · Mood swings, from sadness and depression, to abnormal elation and excitement.  · Periods of mania:  · Racing thoughts.  · Fast speech.  · Poor judgment, and careless, dangerous choices.  · Decreased need for sleep.  · Risky behavior.  · Difficulty concentrating.  · Irritability.  · Increased energy.  · Increased sex drive.  DIAGNOSIS   There are no blood tests or X-rays that can confirm a mood disorder. However, your caregiver may choose to run some tests to make sure that there is not another physical cause for your symptoms. A mood disorder is usually diagnosed after an in-depth interview with a caregiver.  TREATMENT   Mood disorders can be treated with one or more of the  following:  · Medicine. This may include antidepressants, mood-stabilizers, or anti-psychotics.  · Psychotherapy (talk therapy).  · Cognitive behavioral therapy. You are taught to recognize negative thoughts and behavior patterns, and replace them with healthy thoughts and behaviors.  · Electroconvulsive therapy. For very severe cases of deep depression, a series of treatments in which an electrical current is applied to the brain.  · Vagus nerve stimulation. A pulse of electricity is applied to a portion of the brain.  · Transcranial magnetic stimulation. Powerful magnets are placed on the head that produce electrical currents.  · Hospitalization. In severe situations, or when someone is having serious thoughts of harming him or herself, hospitalization may be necessary in order to keep the person safe. This is also done to quickly start and monitor treatment.  HOME CARE INSTRUCTIONS   · Take your medicine exactly as directed.  · Attend all of your therapy sessions.  · Try to eat regular, healthy meals.  · Exercise daily. Exercise may improve mood symptoms.  · Get good sleep.  · Do not drink alcohol or use pot or other drugs. These can worsen mood symptoms and cause anxiety and psychosis.  · Tell your caregiver if you develop any side effects, such as feeling sick to your stomach (nauseous), dry mouth, dizziness, constipation, drowsiness, tremor, weight gain, or sexual symptoms. He or she may suggest things you can do to improve symptoms.  · Learn ways to cope with the stress of having a   thoughts of hurting yourself or others.  You cannot care for yourself.  You develop the sensation of hearing or seeing  something that is not actually present (auditory or visual hallucinations).  You develop abnormal thoughts. Document Released: 03/14/2009 Document Revised: 08/09/2011 Document Reviewed: 03/14/2009 [facility] Patient Information 2014 [place], [place]. Depression, Adult Depression is feeling sad, low, down in the dumps, blue, gloomy, or empty. In general, there are two kinds of depression:  Normal sadness or grief. This can happen after something upsetting. It often goes away on its own within 2 weeks. After losing a loved one (bereavement), normal sadness and grief may last longer than two weeks. It usually gets better with time.  Clinical depression. This kind lasts longer than normal sadness or grief. It keeps you from doing the things you normally do in life. It is often hard to function at home, work, or at school. It may affect your relationships with others. Treatment is often needed. GET HELP RIGHT AWAY IF:  You have thoughts about hurting yourself or others.  You lose touch with reality (psychotic symptoms). You may:  See or hear things that are not real.  Have untrue beliefs about your life or people around you.  Your medicine is giving you problems. MAKE SURE YOU:  Understand these instructions.  Will watch your condition.  Will get help right away if you are not doing well or get worse. Document Released: 06/19/2010 Document Revised: 02/09/2012 Document Reviewed: 09/16/2011 [facility] Patient Information 2014 [place], [place].

## 2013-07-31 NOTE — Progress Notes (Signed)
Subjective:    Patient ID: Dorothy Boyd, female    DOB: 1955-10-08, 58 y.o.   MRN: [id number]  HPI Comments: 58 yo female has been eating better. She has given soda up and tea. She is not checking BS. She has been doing Livalo AD and has not had any SE. She did not f/u for CT abdomen for pain and hx colon cancer AD. She notes abdomen has improved.  She thinks right ear full with pain. She has had increased nasal drainage since starting Farxiga.  She has noticed increased bruising with skin. She has f/u soon with hematology.  She is feeling more depressed despite feeling better with improved diet. She is feeling agitated, trouble with focus, doesn't want to be around anyone. She has tried Cymbalta/ paxil in the past w/o relief.   Current Outpatient Prescriptions on File Prior to Visit  Medication Sig Dispense Refill  . cholecalciferol (VITAMIN D) 1000 UNITS tablet Take 3,000 Units by mouth daily.      . Dapagliflozin Propanediol 10 MG TABS Take by mouth daily.      . promethazine (PHENERGAN) 25 MG tablet Take 1 tablet (25 mg total) by mouth every 6 (six) hours as needed for nausea or vomiting.  30 tablet  0  . sertraline (ZOLOFT) 100 MG tablet Take 1 tablet (100 mg total) by mouth daily.  90 tablet  1   No current facility-administered medications on file prior to visit.   Allergies  Allergen Reactions  . Codeine   . Lunesta [Eszopiclone]     Excessive fatigue  . Metformin And Related     nausea  . Welchol [Colesevelam Hcl]     nausea    Past Medical History  Diagnosis Date  . Hyperlipidemia   . Depression   . Anxiety   . Vitamin D deficiency   . Colon cancer   . Diabetes   . Cervical dysplasia       Review of Systems  HENT: Positive for congestion and postnasal drip.   Hematological: Bruises/bleeds easily.  Psychiatric/Behavioral: Positive for decreased concentration. Negative for suicidal ideas. The patient is nervous/anxious.   All other systems reviewed and are  negative.   BP 112/80  Pulse 98  Temp(Src) 98.2 F (36.8 C) (Temporal)  Resp 16  Ht 5' 2.5" (1.588 m)  Wt 163 lb (73.936 kg)  BMI 29.32 kg/m2     Objective:   Physical Exam  Nursing note and vitals reviewed. Constitutional: She is oriented to person, place, and time. She appears well-developed and well-nourished. No distress.  HENT:  Head: Normocephalic and atraumatic.  Right Ear: External ear normal.  Left Ear: External ear normal.  Nose: Nose normal.  Mouth/Throat: Oropharynx is clear and moist.  Eyes: Conjunctivae and EOM are normal.  Neck: Normal range of motion. Neck supple. No JVD present. No thyromegaly present.  Cardiovascular: Normal rate, regular rhythm, normal heart sounds and intact distal pulses.   Pulmonary/Chest: Effort normal and breath sounds normal.  Abdominal: Soft. Bowel sounds are normal. She exhibits no distension and no mass. There is no tenderness. There is no rebound and no guarding.  Musculoskeletal: Normal range of motion. She exhibits no edema and no tenderness.  Lymphadenopathy:    She has no cervical adenopathy.  Neurological: She is alert and oriented to person, place, and time. No cranial nerve deficit.  Skin: Skin is warm and dry. No rash noted. No erythema. No pallor.  Minimal UE 1-2 cm ecchymosis  Psychiatric:  She has a normal mood and affect. Her behavior is normal. Judgment and thought content normal.          Assessment & Plan:  1.  3 month F/U for  Cholesterol, DM, D. Deficient. Needs healthy diet, cardio QD and obtain healthy weight. Check Labs, Check BP if >130/80 call office 2. Depression- D/C Zoloft, Start Fetzima 20 mg qd x 4 days, then 40 mg QD. SX #28 3. Ecchymosis- Check labs, monitor/ keep hematology f/u 4. Allergic rhinitis- Allegra OTC, increase H2o, allergy hygiene explained.

## 2013-08-01 LAB — LIPID PANEL
CHOL/HDL RATIO: 8.7 ratio
Cholesterol: 234 mg/dL — ABNORMAL HIGH (ref 0–200)
HDL: 27 mg/dL — ABNORMAL LOW (ref >39)
Triglycerides: 515 mg/dL — ABNORMAL HIGH (ref <150)

## 2013-08-01 LAB — BASIC METABOLIC PANEL WITH GFR
BUN: 16 mg/dL (ref 6–23)
CALCIUM: 9.7 mg/dL (ref 8.4–10.5)
CO2: 27 mEq/L (ref 19–32)
Chloride: 101 mEq/L (ref 96–112)
Creat: 0.82 mg/dL (ref 0.50–1.10)
GFR, Est Non African American: 79 mL/min
GLUCOSE: 128 mg/dL — AB (ref 70–99)
Potassium: 4.2 mEq/L (ref 3.5–5.3)
Sodium: 135 mEq/L (ref 135–145)

## 2013-08-01 LAB — CBC WITH DIFFERENTIAL/PLATELET
Basophils Absolute: 0.1 10*3/uL (ref 0.0–0.1)
Basophils Relative: 1 % (ref 0–1)
EOS PCT: 3 % (ref 0–5)
Eosinophils Absolute: 0.4 10*3/uL (ref 0.0–0.7)
HCT: 39.2 % (ref 36.0–46.0)
Hemoglobin: 12.9 g/dL (ref 12.0–15.0)
LYMPHS ABS: 5 10*3/uL — AB (ref 0.7–4.0)
LYMPHS PCT: 38 % (ref 12–46)
MCH: 19.7 pg — AB (ref 26.0–34.0)
MCHC: 32.9 g/dL (ref 30.0–36.0)
MCV: 59.9 fL — AB (ref 78.0–100.0)
MONO ABS: 0.5 10*3/uL (ref 0.1–1.0)
Monocytes Relative: 4 % (ref 3–12)
Neutro Abs: 7.1 10*3/uL (ref 1.7–7.7)
Neutrophils Relative %: 54 % (ref 43–77)
Platelets: 297 10*3/uL (ref 150–400)
RBC: 6.54 MIL/uL — AB (ref 3.87–5.11)
RDW: 17.2 % — ABNORMAL HIGH (ref 11.5–15.5)
WBC: 13.2 10*3/uL — AB (ref 4.0–10.5)

## 2013-08-01 LAB — HEPATIC FUNCTION PANEL
ALT: 17 U/L (ref 0–35)
AST: 15 U/L (ref 0–37)
Albumin: 4.7 g/dL (ref 3.5–5.2)
Alkaline Phosphatase: 69 U/L (ref 39–117)
BILIRUBIN TOTAL: 0.5 mg/dL (ref 0.2–1.2)
Bilirubin, Direct: 0.1 mg/dL (ref 0.0–0.3)
Indirect Bilirubin: 0.4 mg/dL (ref 0.2–1.2)
Total Protein: 7.5 g/dL (ref 6.0–8.3)

## 2013-08-01 LAB — HEMOGLOBIN A1C
Hgb A1c MFr Bld: 8.3 % — ABNORMAL HIGH (ref <5.7)
Mean Plasma Glucose: 192 mg/dL — ABNORMAL HIGH (ref <117)

## 2013-08-03 ENCOUNTER — Encounter: Payer: Self-pay | Admitting: Emergency Medicine

## 2013-08-09 ENCOUNTER — Encounter: Payer: Self-pay | Admitting: Internal Medicine

## 2013-08-10 ENCOUNTER — Other Ambulatory Visit: Payer: Self-pay | Admitting: Emergency Medicine

## 2013-08-10 MED ORDER — SERTRALINE HCL 100 MG PO TABS: 100.0000 mg | ORAL_TABLET | Freq: Every day | ORAL | Status: DC

## 2013-08-20 ENCOUNTER — Encounter: Payer: Self-pay | Admitting: Emergency Medicine

## 2013-08-21 ENCOUNTER — Other Ambulatory Visit: Payer: Self-pay | Admitting: Emergency Medicine

## 2013-08-22 ENCOUNTER — Other Ambulatory Visit: Payer: Self-pay | Admitting: Emergency Medicine

## 2013-08-22 DIAGNOSIS — R6889 Other general symptoms and signs: Secondary | ICD-10-CM

## 2013-09-02 ENCOUNTER — Encounter: Payer: Self-pay | Admitting: Emergency Medicine

## 2013-09-02 ENCOUNTER — Other Ambulatory Visit: Payer: Self-pay | Admitting: Emergency Medicine

## 2013-09-02 MED ORDER — SERTRALINE HCL 100 MG PO TABS: 100.0000 mg | ORAL_TABLET | Freq: Every day | ORAL | Status: DC

## 2013-09-05 ENCOUNTER — Telehealth: Payer: Self-pay | Admitting: Oncology

## 2013-09-05 NOTE — Telephone Encounter (Signed)
LEFT MESSAGE FOR PATIENT TO RETURN CALL TO SCHEDULE NEW PATIENT APPT.  °

## 2013-09-10 ENCOUNTER — Encounter: Payer: Self-pay | Admitting: Emergency Medicine

## 2013-09-11 ENCOUNTER — Encounter: Payer: Self-pay | Admitting: Emergency Medicine

## 2013-09-13 ENCOUNTER — Telehealth: Payer: Self-pay | Admitting: Hematology & Oncology

## 2013-09-13 ENCOUNTER — Telehealth: Payer: Self-pay | Admitting: Nurse Practitioner

## 2013-09-13 NOTE — Telephone Encounter (Signed)
Pt cx 4-20 is seeing [name] on 4-24, RN aware

## 2013-09-13 NOTE — Telephone Encounter (Signed)
Spoke w/pt who informed me that she wished to switch to [name] services. Pt states she has had some depression and prefers not to be in this (familiar) environment. Dr. Marin Olp was made aware and ok with situation (what ever is best for pt's quality of life and satisfaction). Pt verbalized her appreciation of previous years of service.

## 2013-09-17 ENCOUNTER — Ambulatory Visit: Payer: BC Managed Care – PPO | Admitting: Hematology & Oncology

## 2013-09-17 ENCOUNTER — Other Ambulatory Visit: Payer: BC Managed Care – PPO | Admitting: Lab

## 2013-09-20 ENCOUNTER — Other Ambulatory Visit: Payer: Self-pay | Admitting: Oncology

## 2013-09-20 DIAGNOSIS — C189 Malignant neoplasm of colon, unspecified: Secondary | ICD-10-CM

## 2013-09-21 ENCOUNTER — Other Ambulatory Visit (HOSPITAL_BASED_OUTPATIENT_CLINIC_OR_DEPARTMENT_OTHER): Payer: BC Managed Care – PPO

## 2013-09-21 ENCOUNTER — Telehealth: Payer: Self-pay | Admitting: Oncology

## 2013-09-21 ENCOUNTER — Ambulatory Visit: Payer: BC Managed Care – PPO

## 2013-09-21 ENCOUNTER — Encounter: Payer: Self-pay | Admitting: Oncology

## 2013-09-21 ENCOUNTER — Ambulatory Visit (HOSPITAL_BASED_OUTPATIENT_CLINIC_OR_DEPARTMENT_OTHER): Payer: BC Managed Care – PPO | Admitting: Oncology

## 2013-09-21 VITALS — BP 120/72 | HR 93 | Temp 97.5°F | Resp 18 | Ht 62.5 in | Wt 162.4 lb

## 2013-09-21 DIAGNOSIS — D72829 Elevated white blood cell count, unspecified: Secondary | ICD-10-CM

## 2013-09-21 DIAGNOSIS — C189 Malignant neoplasm of colon, unspecified: Secondary | ICD-10-CM

## 2013-09-21 DIAGNOSIS — Z85038 Personal history of other malignant neoplasm of large intestine: Secondary | ICD-10-CM

## 2013-09-21 DIAGNOSIS — R109 Unspecified abdominal pain: Secondary | ICD-10-CM

## 2013-09-21 LAB — COMPREHENSIVE METABOLIC PANEL (CC13)
ALT: 14 U/L (ref 0–55)
ANION GAP: 11 meq/L (ref 3–11)
AST: 13 U/L (ref 5–34)
Albumin: 4.1 g/dL (ref 3.5–5.0)
Alkaline Phosphatase: 96 U/L (ref 40–150)
BUN: 16.3 mg/dL (ref 7.0–26.0)
CO2: 22 meq/L (ref 22–29)
Calcium: 9.9 mg/dL (ref 8.4–10.4)
Chloride: 103 mEq/L (ref 98–109)
Creatinine: 0.8 mg/dL (ref 0.6–1.1)
Glucose: 232 mg/dl — ABNORMAL HIGH (ref 70–140)
Potassium: 3.9 mEq/L (ref 3.5–5.1)
SODIUM: 136 meq/L (ref 136–145)
TOTAL PROTEIN: 8.1 g/dL (ref 6.4–8.3)
Total Bilirubin: 0.48 mg/dL (ref 0.20–1.20)

## 2013-09-21 LAB — CBC WITH DIFFERENTIAL/PLATELET
BASO%: 0.4 % (ref 0.0–2.0)
BASOS ABS: 0.1 10*3/uL (ref 0.0–0.1)
EOS ABS: 0.3 10*3/uL (ref 0.0–0.5)
EOS%: 1.8 % (ref 0.0–7.0)
HEMATOCRIT: 41.4 % (ref 34.8–46.6)
HEMOGLOBIN: 13.4 g/dL (ref 11.6–15.9)
LYMPH%: 31.9 % (ref 14.0–49.7)
MCH: 19.9 pg — AB (ref 25.1–34.0)
MCHC: 32.4 g/dL (ref 31.5–36.0)
MCV: 61.4 fL — AB (ref 79.5–101.0)
MONO#: 0.5 10*3/uL (ref 0.1–0.9)
MONO%: 3.2 % (ref 0.0–14.0)
NEUT#: 8.9 10*3/uL — ABNORMAL HIGH (ref 1.5–6.5)
NEUT%: 62.7 % (ref 38.4–76.8)
PLATELETS: 314 10*3/uL (ref 145–400)
RBC: 6.74 10*6/uL — ABNORMAL HIGH (ref 3.70–5.45)
RDW: 16.5 % — AB (ref 11.2–14.5)
WBC: 14.2 10*3/uL — ABNORMAL HIGH (ref 3.9–10.3)
lymph#: 4.5 10*3/uL — ABNORMAL HIGH (ref 0.9–3.3)
nRBC: 0 % (ref 0–0)

## 2013-09-21 NOTE — Telephone Encounter (Signed)
Gave pt appt for lab and Md for October 2015 °

## 2013-09-21 NOTE — Progress Notes (Signed)
Checked in new pt with no financial concerns. °

## 2013-09-21 NOTE — Progress Notes (Signed)
Hematology and Oncology Follow Up Visit  Dorothy Boyd [id number] [birth year] 58 y.o. 09/21/2013 1:47 PM MCKEOWN,WILLIAM DAVID, MDMcKeown, Gwyndolyn Saxon, MD   Principle Diagnosis: 58 year old with history of stage II (T3, N0, M0) adenocarcinoma, sigmoid colon. She was diagnosed in 2010.  Prior Therapy:  She is status post a rectosigmoid resection which showed invasive moderately differentiated adenocarcinoma spanning 6 cm. 0/16 lymph nodes sampled none had  any evidence of cancer. This was done in August of 2010. She was treated with adjuvant FOLFOX chemotherapy therapy completed in 2011 under the care of Dr. Marin Olp.   Current therapy: Observation and surveillance.  Interim History:  This is a 57 year old woman presents today for an oncology followup. She has a diagnosis of stage II colon cancer as well as a reactive leukocytosis. Patient is seen at Dr. Humphrey Rolls in the past for leukocytosis on her workup has been negative. She was treated for her colon cancer by Dr. Marin Olp and was completed in 2011. She did not followup to about 2014 and at that time she had no evidence of recurrent disease. She elected to switch providers and now establishing care with the [facility] office. Clinically, she does report symptoms of abdominal pain that have been going on for the last 7 months. She has not reported any nausea or vomiting associated with it she does report some occasional diarrhea. She did notice some decrease in her appetite and lost about 3 pounds last year. She did have a colonoscopy in the last year that was unremarkable. She has not reported any radiation of the pain to the back or to the groin. She describes it as more of a nagging dull aching pain. She has not reported any hematochezia or melena. Has not reported any fevers or chills or sweats. Has not reported any lymphadenopathy.  Medications: I have reviewed the patient's current medications.  Current Outpatient Prescriptions  Medication Sig Dispense  Refill  . aspirin EC 325 MG tablet Take 325 mg by mouth daily.      . cholecalciferol (VITAMIN D) 1000 UNITS tablet Take 3,000 Units by mouth daily.      . Dapagliflozin Propanediol 10 MG TABS Take by mouth daily.      . Pitavastatin Calcium (LIVALO) 2 MG TABS Take 2 mg by mouth at bedtime.      . promethazine (PHENERGAN) 25 MG tablet Take 1 tablet (25 mg total) by mouth every 6 (six) hours as needed for nausea or vomiting.  30 tablet  0  . sertraline (ZOLOFT) 100 MG tablet Take 1 tablet (100 mg total) by mouth daily. 1/2 tab  90 tablet  1   No current facility-administered medications for this visit.     Allergies:  Allergies  Allergen Reactions  . Codeine   . Lunesta [Eszopiclone]     Excessive fatigue  . Metformin And Related     nausea  . Welchol [Colesevelam Hcl]     nausea     Past Medical History, Surgical history, Social history, and Family History were reviewed and updated.  Review of Systems: Constitutional:  Negative for fever, chills, night sweats, anorexia, weight loss, pain. Cardiovascular: no chest pain or dyspnea on exertion Respiratory: no cough, shortness of breath, or wheezing Neurological: no TIA or stroke symptoms Dermatological: positive for rash negative for nail changes ENT: negative for - epistaxis or hearing change Skin: Negative. Gastrointestinal: no abdominal pain, change in bowel habits, or black or bloody stools Genito-Urinary: no dysuria, trouble voiding, or hematuria Hematological  and Lymphatic: negative for - bleeding problems, blood clots or night sweats Breast: negative Musculoskeletal: negative Remaining ROS negative. Physical Exam: Blood pressure 120/72, pulse 93, temperature 97.5 F (36.4 C), temperature source Oral, resp. rate 18, height 5' 2.5" (1.588 m), weight 162 lb 6.4 oz (73.664 kg). ECOG: 1 General appearance: alert and cooperative Head: Normocephalic, without obvious abnormality, atraumatic Neck: no adenopathy, no carotid  bruit, no JVD, supple, symmetrical, trachea midline and thyroid not enlarged, symmetric, no tenderness/mass/nodules Lymph nodes: Cervical, supraclavicular, and axillary nodes normal. Heart:regular rate and rhythm, S1, S2 normal, no murmur, click, rub or gallop Lung:chest clear, no wheezing, rales, normal symmetric air entry Abdomin: soft, non-tender, without masses or organomegaly EXT:no erythema, induration, or nodules   Lab Results: Lab Results  Component Value Date   WBC 14.2* 09/21/2013   HGB 13.4 09/21/2013   HCT 41.4 09/21/2013   MCV 61.4* 09/21/2013   PLT 314 09/21/2013     Chemistry      Component Value Date/Time   NA 135 07/31/2013 1716   K 4.2 07/31/2013 1716   CL 101 07/31/2013 1716   CO2 27 07/31/2013 1716   BUN 16 07/31/2013 1716   CREATININE 0.82 07/31/2013 1716   CREATININE 0.73 11/20/2009 1411      Component Value Date/Time   CALCIUM 9.7 07/31/2013 1716   ALKPHOS 69 07/31/2013 1716   AST 15 07/31/2013 1716   ALT 17 07/31/2013 1716   BILITOT 0.5 07/31/2013 1716        Impression and Plan:   58 year old female, with the following issues:  1. History of stage II adenocarcinoma of the sigmoid colon, she is status post surgical resection in 2010. She did complete chemotherapy adjuvantly in the form of FOLFOX completed in 2011. According to our records, she had not had a CT scan since 2011 which was clear. Report, she have not had any any other institution. She is complaining of abdominal pain that is vague and constant for the last 7 months. She did have a 3 pound weight loss associated with it. Her examination did not reveal any major abnormalities. She is very concerned about this abdominal pain and fever of cancer recurrence. I think it's reasonable to obtain a CT scan for staging purposes and if it is clear, I would probably do not any further imaging studies unless needed to.  2. Leukocytosis: This will most likely reactive in nature. Her white cell count has been elevated since at  least 2008. Her white cell count at that time was about 19,000. Her workup as been unrevealing previously in her white cell count today showed normal differential. The plan is continued observation for the time being.  3. Followup: Will be in 6 months sooner if if there is any abnormalities noted on her CT scan.     Wyatt Portela, MD 4/24/20151:47 PM

## 2013-09-22 LAB — CEA: CEA: 1.9 ng/mL (ref 0.0–5.0)

## 2013-09-25 ENCOUNTER — Ambulatory Visit (HOSPITAL_COMMUNITY): Payer: BC Managed Care – PPO

## 2013-09-25 ENCOUNTER — Ambulatory Visit (HOSPITAL_COMMUNITY)
Admission: RE | Admit: 2013-09-25 | Discharge: 2013-09-25 | Disposition: A | Discharge status: Home / Self Care | Payer: BC Managed Care – PPO | Source: Ambulatory Visit | Attending: Oncology | Admitting: Oncology

## 2013-09-25 DIAGNOSIS — C189 Malignant neoplasm of colon, unspecified: Secondary | ICD-10-CM

## 2013-09-25 DIAGNOSIS — C20 Malignant neoplasm of rectum: Secondary | ICD-10-CM | POA: Insufficient documentation

## 2013-09-25 MED ORDER — IOHEXOL 300 MG/ML  SOLN
100.0000 mL | Freq: Once | INTRAMUSCULAR | Status: AC | PRN
  Administered 2013-09-25: 100 mL via INTRAVENOUS

## 2013-09-26 ENCOUNTER — Telehealth: Payer: Self-pay | Admitting: Medical Oncology

## 2013-09-26 NOTE — Telephone Encounter (Signed)
Per MD, informed patient of CT results. Patient expressed thanks, but concerned that "it doesn't explain her issues," but states she is grateful for the positive results. Encouraged her to call office with any questions or concerns.

## 2013-09-26 NOTE — Telephone Encounter (Signed)
Message copied by Maxwell Marion on Wed Sep 26, 2013  9:11 AM ------      Message from: Wyatt Portela      Created: Wed Sep 26, 2013  8:17 AM       Please call her CT scan results. All normal without any cancer. ------

## 2013-12-04 ENCOUNTER — Encounter: Payer: Self-pay | Admitting: Emergency Medicine

## 2013-12-04 ENCOUNTER — Other Ambulatory Visit: Payer: Self-pay | Admitting: Emergency Medicine

## 2013-12-04 MED ORDER — METFORMIN HCL ER 500 MG PO TB24: 500.0000 mg | ORAL_TABLET | Freq: Two times a day (BID) | ORAL | Status: DC

## 2013-12-12 ENCOUNTER — Encounter: Payer: Self-pay | Admitting: Emergency Medicine

## 2014-01-15 ENCOUNTER — Other Ambulatory Visit: Payer: Self-pay | Admitting: Internal Medicine

## 2014-01-15 ENCOUNTER — Encounter: Payer: Self-pay | Admitting: Internal Medicine

## 2014-01-15 DIAGNOSIS — J019 Acute sinusitis, unspecified: Secondary | ICD-10-CM

## 2014-01-15 MED ORDER — AMOXICILLIN 250 MG PO CAPS: 250.0000 mg | ORAL_CAPSULE | Freq: Three times a day (TID) | ORAL | Status: AC

## 2014-01-15 NOTE — Telephone Encounter (Signed)
Left message for the patient to call and schedule a follow up appointment. ° °Thanks, °Katrina ° °

## 2014-03-05 ENCOUNTER — Telehealth: Payer: Self-pay | Admitting: Oncology

## 2014-03-05 NOTE — Telephone Encounter (Signed)
Lft msg for pt confirming labs/ov r/s time per MD schedule..... KJ

## 2014-03-21 ENCOUNTER — Encounter: Payer: Self-pay | Admitting: Physician Assistant

## 2014-03-21 ENCOUNTER — Ambulatory Visit (INDEPENDENT_AMBULATORY_CARE_PROVIDER_SITE_OTHER): Payer: BC Managed Care – PPO | Admitting: Physician Assistant

## 2014-03-21 VITALS — BP 120/70 | HR 88 | Temp 97.7°F | Resp 16 | Wt 171.0 lb

## 2014-03-21 DIAGNOSIS — E785 Hyperlipidemia, unspecified: Secondary | ICD-10-CM

## 2014-03-21 DIAGNOSIS — Z79899 Other long term (current) drug therapy: Secondary | ICD-10-CM

## 2014-03-21 DIAGNOSIS — E119 Type 2 diabetes mellitus without complications: Secondary | ICD-10-CM

## 2014-03-21 DIAGNOSIS — E538 Deficiency of other specified B group vitamins: Secondary | ICD-10-CM

## 2014-03-21 DIAGNOSIS — E559 Vitamin D deficiency, unspecified: Secondary | ICD-10-CM

## 2014-03-21 LAB — CBC WITH DIFFERENTIAL/PLATELET
BASOS PCT: 1 % (ref 0–1)
Basophils Absolute: 0.1 10*3/uL (ref 0.0–0.1)
EOS ABS: 0.3 10*3/uL (ref 0.0–0.7)
Eosinophils Relative: 2 % (ref 0–5)
HCT: 36.4 % (ref 36.0–46.0)
Hemoglobin: 12.1 g/dL (ref 12.0–15.0)
Lymphocytes Relative: 35 % (ref 12–46)
Lymphs Abs: 4.5 10*3/uL — ABNORMAL HIGH (ref 0.7–4.0)
MCH: 20 pg — AB (ref 26.0–34.0)
MCHC: 33.2 g/dL (ref 30.0–36.0)
MCV: 60.3 fL — ABNORMAL LOW (ref 78.0–100.0)
Monocytes Absolute: 0.5 10*3/uL (ref 0.1–1.0)
Monocytes Relative: 4 % (ref 3–12)
NEUTROS PCT: 58 % (ref 43–77)
Neutro Abs: 7.5 10*3/uL (ref 1.7–7.7)
PLATELETS: 357 10*3/uL (ref 150–400)
RBC: 6.04 MIL/uL — AB (ref 3.87–5.11)
RDW: 15.3 % (ref 11.5–15.5)
WBC: 12.9 10*3/uL — ABNORMAL HIGH (ref 4.0–10.5)

## 2014-03-21 NOTE — Patient Instructions (Signed)
Please add on low dose aspirin    Bad carbs also include fruit juice, alcohol, and sweet tea. These are empty calories that do not signal to your brain that you are full.   Please remember the good carbs are still carbs which convert into sugar. So please measure them out no more than 1/2-1 cup of rice, oatmeal, pasta, and beans.  Veggies are however free foods! Pile them on.   I like lean protein at every meal such as chicken, [place], pork chops, cottage cheese, etc. Just do not fry these meats and please center your meal around vegetable, the meats should be a side dish.   No all fruit is created equal. Please see the list below, the fruit at the bottom is higher in sugars than the fruit at the top

## 2014-03-21 NOTE — Progress Notes (Signed)
Assessment and Plan:  Hypertension: Continue medication, monitor blood pressure at home. Continue DASH diet.  Reminder to go to the ER if any CP, SOB, nausea, dizziness, severe HA, changes vision/speech, left arm numbness and tingling and jaw pain. Cholesterol: Continue diet and exercise. Check cholesterol.  Diabetes-Continue diet and exercise. Check A1C Vitamin D Def- check level and continue medications.  Fatigue/blurry vision/dizzy- no acute neurological deficits add bASA, if worsening symptoms go to ER, add trajenta to metformin, get labs. Follow up 1 month.   Continue diet and meds as discussed. Further disposition pending results of labs. Discussed med's effects and SE's.    HPI 58 y.o. female  presents for over due visit, last visit 07/2013, has history of  hyperlipidemia, diabetes, colon cancer s/p chemo and has fatigue, weakness, blurry vision.   Her blood pressure has been controlled at home, today their BP is BP: 120/70 mmHg She does not workout. She denies chest pain, shortness of breath, dizziness.   She is not on cholesterol medication and denies myalgias. Her cholesterol is not at goal. The cholesterol last visit was:   Lab Results  Component Value Date   CHOL 234* 07/31/2013   HDL 27* 07/31/2013   LDLCALC NOT CALC 07/31/2013   TRIG 515* 07/31/2013   CHOLHDL 8.7 07/31/2013   She has been working on diet and recently started walking on the treadmill for Diabetes, was on farxiga but quit due to yeast, is only on MF, no way to check sugars at home sugar in the office in 240, and denies hypoglycemia  and polydipsia. Last A1C in the office was:  Lab Results  Component Value Date   HGBA1C 8.3* 07/31/2013   Patient is on Vitamin D supplement. Lab Results  Component Value Date   VD25OH 25* 06/26/2013     She was at work today right before lunch when she started to feel light headed, blurry vision, and some nausea, dizziness worse with standing. She has had worsening urinary frequency, no  dysuria, she denies changes in speech, weakness, trouble swallowing.    Current Medications:  Current Outpatient Prescriptions on File Prior to Visit  Medication Sig Dispense Refill  . Dapagliflozin Propanediol (FARXIGA) 10 MG TABS Take 10 mg by mouth daily.      . metFORMIN (GLUCOPHAGE XR) 500 MG 24 hr tablet Take 1 tablet (500 mg total) by mouth 2 (two) times daily.  60 tablet  3  . promethazine (PHENERGAN) 25 MG tablet Take 1 tablet (25 mg total) by mouth every 6 (six) hours as needed for nausea or vomiting.  30 tablet  0  . sertraline (ZOLOFT) 100 MG tablet Take 1 tablet (100 mg total) by mouth daily. 1/2 tab  90 tablet  1   No current facility-administered medications on file prior to visit.   Medical History:  Past Medical History  Diagnosis Date  . Hyperlipidemia   . Depression   . Anxiety   . Vitamin D deficiency   . Colon cancer   . Diabetes   . Cervical dysplasia    Allergies:  Allergies  Allergen Reactions  . Codeine   . Lunesta [Eszopiclone]     Excessive fatigue  . Welchol [Colesevelam Hcl]     nausea      Review of Systems: [X]  = complains of  [ ]  = denies  General: Fatigue [x ] Fever [ ]  Chills [ ]  Weakness [x ]  Insomnia [ ]  Eyes: Redness [ ]  Blurred vision [x ] Diplopia [ ]   ENT: Congestion [ ]  Sinus Pain [ ]  Post Nasal Drip [ ]  Sore Throat [ ]  Earache [ ]   Cardiac: Chest pain/pressure [ ]  SOB [ ]  Orthopnea [ ]   Palpitations [ ]   Paroxysmal nocturnal dyspnea[ ]  Claudication [ ]  Edema [ ]   Pulmonary: Cough [ ]  Wheezing[ ]   SOB [ ]   Snoring [ ]   GI: Nausea [x ] Vomiting[ ]  Dysphagia[ ]  Heartburn[ ]  Abdominal pain [ ]  Constipation [ ] ; Diarrhea [ ] ; BRBPR [ ]  Melena[ ]  GU: Hematuria[ ]  Dysuria [ ]  Nocturia[x ] Urgency [ ]   Hesitancy [ ]  Discharge [ ]  Neuro: Headaches[ ]  Vertigo[x ] Paresthesias[ ]  Spasm [ ]  Speech changes [ ]  Incoordination [ ]   Ortho: Arthritis [ ]  Joint pain [ ]  Muscle pain [ ]  Joint swelling [ ]  Back Pain [ ]  Skin:  Rash [ ]   Pruritis [ ]   Change in skin lesion [ ]   Psych: Depression[ ]  Anxiety[ ]  Confusion [ ]  Memory loss [ ]   Heme/Lypmh: Bleeding [ ]  Bruising [ ]  Enlarged lymph nodes [ ]   Endocrine: Visual blurring [x]  Paresthesia [ ]  Polyuria [x ] Polydypsea [x ]   Heat/cold intolerance [ ]  Hypoglycemia [ ]   Family history- Review and unchanged Social history- Review and unchanged Physical Exam: BP 120/70  Pulse 88  Temp(Src) 97.7 F (36.5 C)  Resp 16  Wt 171 lb (77.565 kg) Wt Readings from Last 3 Encounters:  03/21/14 171 lb (77.565 kg)  09/21/13 162 lb 6.4 oz (73.664 kg)  07/31/13 163 lb (73.936 kg)   General Appearance: Well nourished, in no apparent distress. Eyes: PERRLA, EOMs, conjunctiva no swelling or erythema Sinuses: No Frontal/maxillary tenderness ENT/Mouth: Ext aud canals clear, TMs without erythema, bulging. No erythema, swelling, or exudate on post pharynx.  Tonsils not swollen or erythematous. Hearing normal.  Neck: Supple, thyroid normal.  Respiratory: Respiratory effort normal, BS equal bilaterally without rales, rhonchi, wheezing or stridor.  Cardio: RRR with 3/6 systolic murmur best heard at RSB with radiation to carotids. Brisk peripheral pulses without edema.  Abdomen: Soft, + BS.  Non tender, no guarding, rebound, hernias, masses. Lymphatics: Non tender without lymphadenopathy.  Musculoskeletal: Full ROM, 5/5 strength, normal gait.  Skin: Warm, dry without rashes, lesions, ecchymosis.  Neuro: Cranial nerves intact. No cerebellar symptoms. Marland Kitchen  Psych: Awake and oriented X 3, normal affect, Insight and Judgment appropriate.     [name], PA-C 5:04 PM [facility]

## 2014-03-22 LAB — TSH: TSH: 2.356 u[IU]/mL (ref 0.350–4.500)

## 2014-03-22 LAB — LIPID PANEL
CHOL/HDL RATIO: 7.3 ratio
CHOLESTEROL: 219 mg/dL — AB (ref 0–200)
HDL: 30 mg/dL — ABNORMAL LOW (ref >39)
Triglycerides: 637 mg/dL — ABNORMAL HIGH (ref <150)

## 2014-03-22 LAB — HEPATIC FUNCTION PANEL
ALBUMIN: 4.4 g/dL (ref 3.5–5.2)
ALT: 15 U/L (ref 0–35)
AST: 13 U/L (ref 0–37)
Alkaline Phosphatase: 91 U/L (ref 39–117)
BILIRUBIN DIRECT: 0.1 mg/dL (ref 0.0–0.3)
BILIRUBIN TOTAL: 0.4 mg/dL (ref 0.2–1.2)
Indirect Bilirubin: 0.3 mg/dL (ref 0.2–1.2)
Total Protein: 7 g/dL (ref 6.0–8.3)

## 2014-03-22 LAB — HEMOGLOBIN A1C
Hgb A1c MFr Bld: 9.3 % — ABNORMAL HIGH (ref <5.7)
Mean Plasma Glucose: 220 mg/dL — ABNORMAL HIGH (ref <117)

## 2014-03-22 LAB — BASIC METABOLIC PANEL WITH GFR
BUN: 14 mg/dL (ref 6–23)
CALCIUM: 9.4 mg/dL (ref 8.4–10.5)
CO2: 25 mEq/L (ref 19–32)
Chloride: 101 mEq/L (ref 96–112)
Creat: 0.66 mg/dL (ref 0.50–1.10)
GFR, Est Non African American: >89 mL/min
Glucose, Bld: 200 mg/dL — ABNORMAL HIGH (ref 70–99)
Potassium: 4.3 mEq/L (ref 3.5–5.3)
Sodium: 134 mEq/L — ABNORMAL LOW (ref 135–145)

## 2014-03-22 LAB — VITAMIN D 25 HYDROXY (VIT D DEFICIENCY, FRACTURES): VIT D 25 HYDROXY: 26 ng/mL — AB (ref 30–89)

## 2014-03-22 LAB — MAGNESIUM: MAGNESIUM: 2 mg/dL (ref 1.5–2.5)

## 2014-03-22 LAB — VITAMIN B12: VITAMIN B 12: 357 pg/mL (ref 211–911)

## 2014-03-22 LAB — INSULIN, FASTING: Insulin fasting, serum: 21.9 u[IU]/mL — ABNORMAL HIGH (ref 2.0–19.6)

## 2014-03-26 ENCOUNTER — Other Ambulatory Visit: Payer: BC Managed Care – PPO

## 2014-03-26 ENCOUNTER — Ambulatory Visit: Payer: BC Managed Care – PPO | Admitting: Oncology

## 2014-04-23 ENCOUNTER — Telehealth: Payer: Self-pay | Admitting: *Deleted

## 2014-04-23 MED ORDER — LINAGLIPTIN 5 MG PO TABS
5.0000 mg | ORAL_TABLET | Freq: Every day | ORAL | Status: DC
Start: 2014-04-23 — End: 2014-04-24

## 2014-04-23 NOTE — Telephone Encounter (Signed)
Patient called requesting samples of Tradjenta 5 mg.

## 2014-04-24 ENCOUNTER — Encounter: Payer: Self-pay | Admitting: Physician Assistant

## 2014-04-24 ENCOUNTER — Ambulatory Visit (INDEPENDENT_AMBULATORY_CARE_PROVIDER_SITE_OTHER): Payer: BC Managed Care – PPO | Admitting: Physician Assistant

## 2014-04-24 VITALS — BP 110/68 | HR 72 | Temp 97.9°F | Resp 16 | Ht 63.0 in | Wt 173.0 lb

## 2014-04-24 DIAGNOSIS — E871 Hypo-osmolality and hyponatremia: Secondary | ICD-10-CM

## 2014-04-24 DIAGNOSIS — E119 Type 2 diabetes mellitus without complications: Secondary | ICD-10-CM

## 2014-04-24 DIAGNOSIS — R58 Hemorrhage, not elsewhere classified: Secondary | ICD-10-CM

## 2014-04-24 DIAGNOSIS — Z79899 Other long term (current) drug therapy: Secondary | ICD-10-CM

## 2014-04-24 DIAGNOSIS — F32A Depression, unspecified: Secondary | ICD-10-CM

## 2014-04-24 DIAGNOSIS — F329 Major depressive disorder, single episode, unspecified: Secondary | ICD-10-CM

## 2014-04-24 DIAGNOSIS — I10 Essential (primary) hypertension: Secondary | ICD-10-CM

## 2014-04-24 LAB — CBC WITH DIFFERENTIAL/PLATELET
Basophils Absolute: 0.1 K/uL (ref 0.0–0.1)
Basophils Relative: 1 % (ref 0–1)
Eosinophils Absolute: 0.3 K/uL (ref 0.0–0.7)
Eosinophils Relative: 2 % (ref 0–5)
HCT: 36 % (ref 36.0–46.0)
Hemoglobin: 11.8 g/dL — ABNORMAL LOW (ref 12.0–15.0)
Lymphocytes Relative: 33 % (ref 12–46)
Lymphs Abs: 4.2 K/uL — ABNORMAL HIGH (ref 0.7–4.0)
MCH: 19.5 pg — ABNORMAL LOW (ref 26.0–34.0)
MCHC: 32.8 g/dL (ref 30.0–36.0)
MCV: 59.5 fL — ABNORMAL LOW (ref 78.0–100.0)
MPV: 9.4 fL (ref 9.4–12.4)
Monocytes Absolute: 0.4 K/uL (ref 0.1–1.0)
Monocytes Relative: 3 % (ref 3–12)
Neutro Abs: 7.7 K/uL (ref 1.7–7.7)
Neutrophils Relative %: 61 % (ref 43–77)
Platelets: 384 K/uL (ref 150–400)
RBC: 6.05 MIL/uL — ABNORMAL HIGH (ref 3.87–5.11)
RDW: 15.4 % (ref 11.5–15.5)
WBC: 12.7 K/uL — ABNORMAL HIGH (ref 4.0–10.5)

## 2014-04-24 LAB — BASIC METABOLIC PANEL WITHOUT GFR
BUN: 11 mg/dL (ref 6–23)
CO2: 26 meq/L (ref 19–32)
Calcium: 9.6 mg/dL (ref 8.4–10.5)
Chloride: 100 meq/L (ref 96–112)
Creat: 0.67 mg/dL (ref 0.50–1.10)
GFR, Est African American: >89 mL/min
GFR, Est Non African American: >89 mL/min
Glucose, Bld: 292 mg/dL — ABNORMAL HIGH (ref 70–99)
Potassium: 4.1 meq/L (ref 3.5–5.3)
Sodium: 136 meq/L (ref 135–145)

## 2014-04-24 MED ORDER — LINAGLIPTIN 5 MG PO TABS: 5.0000 mg | ORAL_TABLET | Freq: Every day | ORAL | Status: DC

## 2014-04-24 MED ORDER — DULAGLUTIDE 0.75 MG/0.5ML ~~LOC~~ SOAJ: SUBCUTANEOUS | Status: DC

## 2014-04-24 MED ORDER — ASPIRIN EC 81 MG PO TBEC
81.0000 mg | DELAYED_RELEASE_TABLET | Freq: Every day | ORAL | Status: DC
Start: 2014-04-24 — End: 2014-08-27

## 2014-04-24 MED ORDER — CLONAZEPAM 0.5 MG PO TABS: 0.5000 mg | ORAL_TABLET | Freq: Two times a day (BID) | ORAL | Status: DC

## 2014-04-24 NOTE — Progress Notes (Signed)
HPI 58 y.o.female presents for 1 month follow up.  She has been treated for colon cancer with chem, continuing follow up. Her sugars have been poorly controlled and last visit she came to the office with fatigue, blurry vision, and dizziness. Her sugars were in the 200's. She is unable to tolerate MF, invokana/metformin/januiva- tried on Trajenta and her sugars have been better, still in the 200's however with no more symptoms.  She has however been having more anxiety, she did recently decrease her zoloft to 50mg  daily. Denies SI/HI.   Past Medical History  Diagnosis Date  . Hyperlipidemia   . Depression   . Anxiety   . Vitamin D deficiency   . Colon cancer   . Diabetes   . Cervical dysplasia      Allergies  Allergen Reactions  . Codeine   . Lunesta [Eszopiclone]     Excessive fatigue  . Welchol [Colesevelam Hcl]     nausea       Current Outpatient Prescriptions on File Prior to Visit  Medication Sig Dispense Refill  . Dapagliflozin Propanediol (FARXIGA) 10 MG TABS Take 10 mg by mouth daily.    Marland Kitchen linagliptin (TRADJENTA) 5 MG TABS tablet Take 1 tablet (5 mg total) by mouth daily. 21 tablet 0  . metFORMIN (GLUCOPHAGE XR) 500 MG 24 hr tablet Take 1 tablet (500 mg total) by mouth 2 (two) times daily. 60 tablet 3  . promethazine (PHENERGAN) 25 MG tablet Take 1 tablet (25 mg total) by mouth every 6 (six) hours as needed for nausea or vomiting. 30 tablet 0  . sertraline (ZOLOFT) 100 MG tablet Take 1 tablet (100 mg total) by mouth daily. 1/2 tab 90 tablet 1   No current facility-administered medications on file prior to visit.    ROS: all negative except above.   Physical Exam: Filed Weights   04/24/14 1359  Weight: 173 lb (78.472 kg)   BP 110/68 mmHg  Pulse 72  Temp(Src) 97.9 F (36.6 C)  Resp 16  Ht 5\' 3"  (1.6 m)  Wt 173 lb (78.472 kg)  BMI 30.65 kg/m2 General Appearance: Well nourished, in no apparent distress. Eyes: PERRLA, EOMs, conjunctiva no swelling or  erythema Sinuses: No Frontal/maxillary tenderness ENT/Mouth: Ext aud canals clear, TMs without erythema, bulging. No erythema, swelling, or exudate on post pharynx.  Tonsils not swollen or erythematous. Hearing normal.  Neck: Supple, thyroid normal.  Respiratory: Respiratory effort normal, BS equal bilaterally without rales, rhonchi, wheezing or stridor.  Cardio: RRR with no MRGs. Brisk peripheral pulses without edema.  Abdomen: Soft, + BS.  Non tender, no guarding, rebound, hernias, masses. Lymphatics: Non tender without lymphadenopathy.  Musculoskeletal: Full ROM, 5/5 strength, normal gait.  Skin: Warm, dry without rashes, lesions, ecchymosis.  Neuro: Cranial nerves intact. Normal muscle tone, no cerebellar symptoms. Sensation intact.  Psych: Awake and oriented X 3, normal affect, Insight and Judgment appropriate.     Assessment and Plan: Eccymosis- decrease ASA to 3 days a week, check CBC, PTPTT Anxiety- increase zoloft to 100mg  daily, add klonopin 0.5mg  1/2-1 pill BID PRN #30 NR, no SOB,CP- if she does go to the ER Hyponatremia- check BMP Diabetes- very nervous about needles, willing to try trulicity, has failed metformin, invokana, farxiga- samples given and instructed on how to use  OVER 30 minutes of exam, counseling, chart review, referral performed     [name], PA-C 2:26 PM

## 2014-04-24 NOTE — Patient Instructions (Signed)
Decrease ASA to 3 a day Check sugars.   If your morning sugar is always below 100 then the issue is with your sugar spiking after meals. Try to take your blood sugar approximately 2 hours after eating, this number should be less than 200. If it is not, think about the foods that you ate and better choices you can make.   Goal is morning sugars below 120 and 2 hours after eating goal is less than 220.      Bad carbs also include fruit juice, alcohol, and sweet tea. These are empty calories that do not signal to your brain that you are full.   Please remember the good carbs are still carbs which convert into sugar. So please measure them out no more than 1/2-1 cup of rice, oatmeal, pasta, and beans  Veggies are however free foods! Pile them on.   Not all fruit is created equal. Please see the list below, the fruit at the bottom is higher in sugars than the fruit at the top. Please avoid all dried fruits.

## 2014-04-25 LAB — APTT: aPTT: 30 seconds (ref 24–37)

## 2014-04-25 LAB — PROTIME-INR
INR: 0.97 (ref <1.50)
PROTHROMBIN TIME: 12.9 s (ref 11.6–15.2)

## 2014-06-14 ENCOUNTER — Encounter: Payer: Self-pay | Admitting: Physician Assistant

## 2014-06-17 DIAGNOSIS — Z0000 Encounter for general adult medical examination without abnormal findings: Secondary | ICD-10-CM

## 2014-06-27 ENCOUNTER — Encounter: Payer: Self-pay | Admitting: Emergency Medicine

## 2014-08-27 ENCOUNTER — Ambulatory Visit (INDEPENDENT_AMBULATORY_CARE_PROVIDER_SITE_OTHER): Payer: BLUE CROSS/BLUE SHIELD | Admitting: Physician Assistant

## 2014-08-27 ENCOUNTER — Encounter: Payer: Self-pay | Admitting: Physician Assistant

## 2014-08-27 VITALS — BP 110/68 | HR 80 | Temp 97.9°F | Resp 16 | Ht 63.0 in | Wt 172.0 lb

## 2014-08-27 DIAGNOSIS — Z79899 Other long term (current) drug therapy: Secondary | ICD-10-CM

## 2014-08-27 DIAGNOSIS — F419 Anxiety disorder, unspecified: Secondary | ICD-10-CM

## 2014-08-27 DIAGNOSIS — E559 Vitamin D deficiency, unspecified: Secondary | ICD-10-CM

## 2014-08-27 DIAGNOSIS — F32A Depression, unspecified: Secondary | ICD-10-CM

## 2014-08-27 DIAGNOSIS — R1011 Right upper quadrant pain: Secondary | ICD-10-CM

## 2014-08-27 DIAGNOSIS — E785 Hyperlipidemia, unspecified: Secondary | ICD-10-CM

## 2014-08-27 DIAGNOSIS — E871 Hypo-osmolality and hyponatremia: Secondary | ICD-10-CM

## 2014-08-27 DIAGNOSIS — E119 Type 2 diabetes mellitus without complications: Secondary | ICD-10-CM

## 2014-08-27 DIAGNOSIS — F329 Major depressive disorder, single episode, unspecified: Secondary | ICD-10-CM

## 2014-08-27 DIAGNOSIS — C189 Malignant neoplasm of colon, unspecified: Secondary | ICD-10-CM

## 2014-08-27 LAB — CBC WITH DIFFERENTIAL/PLATELET
Basophils Absolute: 0 10*3/uL (ref 0.0–0.1)
Basophils Relative: 0 % (ref 0–1)
EOS ABS: 0.3 10*3/uL (ref 0.0–0.7)
EOS PCT: 2 % (ref 0–5)
HCT: 36.7 % (ref 36.0–46.0)
HEMOGLOBIN: 11.8 g/dL — AB (ref 12.0–15.0)
LYMPHS ABS: 4.8 10*3/uL — AB (ref 0.7–4.0)
Lymphocytes Relative: 31 % (ref 12–46)
MCH: 19.6 pg — ABNORMAL LOW (ref 26.0–34.0)
MCHC: 32.2 g/dL (ref 30.0–36.0)
MCV: 61.1 fL — ABNORMAL LOW (ref 78.0–100.0)
MONO ABS: 0.6 10*3/uL (ref 0.1–1.0)
MONOS PCT: 4 % (ref 3–12)
MPV: 9.6 fL (ref 8.6–12.4)
NEUTROS ABS: 9.8 10*3/uL — AB (ref 1.7–7.7)
NEUTROS PCT: 63 % (ref 43–77)
PLATELETS: 330 10*3/uL (ref 150–400)
RBC: 6.01 MIL/uL — ABNORMAL HIGH (ref 3.87–5.11)
RDW: 16 % — ABNORMAL HIGH (ref 11.5–15.5)
WBC: 15.5 10*3/uL — ABNORMAL HIGH (ref 4.0–10.5)

## 2014-08-27 MED ORDER — ALBIGLUTIDE 50 MG ~~LOC~~ PEN
PEN_INJECTOR | SUBCUTANEOUS | Status: DC
Start: 2014-08-27 — End: 2015-03-19

## 2014-08-27 NOTE — Progress Notes (Signed)
Assessment and Plan:  1. Hypertension -Continue medication, monitor blood pressure at home. Continue DASH diet.  Reminder to go to the ER if any CP, SOB, nausea, dizziness, severe HA, changes vision/speech, left arm numbness and tingling and jaw pain.  2. Cholesterol -Continue diet and exercise. Check cholesterol.  -will add fenofibrate 160 due to risk of pancreatitis, discussed risk with patient May need to start on statin pending LDL numbers.   3. Diabetes without complications -Continue diet and exercise. Check A1C - continue trajenta - will add on tanzeum 30mg , and will give samples of 50 and RX for 50mg  Intolerant to trulicity, invokana, Metformin  4. Vitamin D Def - check level and continue medications.  5. RUQ pain S/P choley, no radiation of pain, no rebound tenderness Check GGT/LFTs/amalyse.   6. Colon cancer s/p treatment + constipation, benign AB, no rebound Add benefiber, miralax PRN, if worse follow up GI doctor.   7. Neuropathy ? From DM vs Chemo- declines meds at this time.  Control sugars better  Continue diet and meds as discussed. Further disposition pending results of labs. Discussed med's effects and SE's.   Over 30 minutes of exam, counseling, chart review, and critical decision making was performed   HPI 59 y.o. female  presents for 3 month follow up on hypertension, cholesterol, diabetes, history of colon cancer and vitamin D deficiency.  She will be losing her insurance at the end of this month.   Her blood pressure has been controlled at home, today her BP is BP: 110/68 mmHg.  She does not workout. She denies chest pain, shortness of breath, dizziness.  She is not on cholesterol medication and denies myalgias. Her cholesterol is not at goal. The cholesterol was:   Lab Results  Component Value Date   CHOL 219* 03/21/2014   HDL 30* 03/21/2014   LDLCALC NOT CALC 03/21/2014   TRIG 637* 03/21/2014   CHOLHDL 7.3 03/21/2014   She has been working on  diet and exercise for diabetes with diabetic chronic kidney disease, she is not on bASA due to eccymosis, she is on trajenta and was on trulicity but felt like it was an "air gun" and wants to try tanzeum, she is not on ACE/ARB due to hypotension and denies  polydipsia, polyuria and visual disturbances. She does have paraesthesia in bilateral feet, started with chemo for colon cancer treatment, denies lower back pain/pain down legs. Does occ have weakness in her legs with walking.  Last A1C was:  Lab Results  Component Value Date   HGBA1C 9.3* 03/21/2014  Patient is on Vitamin D supplement. She has felt stable with her anxiety, she is on zoloft 100mg  1/2 pill a day.   Current Medications:  Current Outpatient Prescriptions on File Prior to Visit  Medication Sig Dispense Refill  . linagliptin (TRADJENTA) 5 MG TABS tablet Take 1 tablet (5 mg total) by mouth daily. 30 tablet 3  . sertraline (ZOLOFT) 100 MG tablet Take 1 tablet (100 mg total) by mouth daily. 1/2 tab 90 tablet 1   No current facility-administered medications on file prior to visit.   Medical History:  Past Medical History  Diagnosis Date  . Hyperlipidemia   . Depression   . Anxiety   . Vitamin D deficiency   . Colon cancer   . Diabetes   . Cervical dysplasia    Allergies:  Allergies  Allergen Reactions  . Codeine   . Lunesta [Eszopiclone]     Excessive fatigue  . Welchol [  Colesevelam Hcl]     nausea     Review of Systems:  Review of Systems  Constitutional: Negative.   HENT: Negative.   Respiratory: Negative.   Cardiovascular: Negative.   Gastrointestinal: Positive for constipation. Negative for heartburn, nausea, vomiting, abdominal pain, diarrhea, blood in stool and melena.  Genitourinary: Positive for frequency. Negative for dysuria, urgency, hematuria and flank pain.  Musculoskeletal: Negative.   Skin: Negative.   Neurological: Positive for tingling (bilateral feet). Negative for dizziness, tremors,  sensory change, speech change, focal weakness, seizures and loss of consciousness.  Endo/Heme/Allergies: Negative for environmental allergies and polydipsia. Bruises/bleeds easily.  Psychiatric/Behavioral: Negative for depression, suicidal ideas, hallucinations, memory loss and substance abuse. The patient is nervous/anxious and has insomnia.     Family history- Review and unchanged Social history- Review and unchanged Physical Exam: BP 110/68 mmHg  Pulse 80  Temp(Src) 97.9 F (36.6 C)  Resp 16  Ht 5\' 3"  (1.6 m)  Wt 172 lb (78.019 kg)  BMI 30.48 kg/m2 Wt Readings from Last 3 Encounters:  08/27/14 172 lb (78.019 kg)  04/24/14 173 lb (78.472 kg)  03/21/14 171 lb (77.565 kg)   General Appearance: Well nourished, in no apparent distress. Eyes: PERRLA, EOMs, conjunctiva no swelling or erythema Sinuses: No Frontal/maxillary tenderness ENT/Mouth: Ext aud canals clear, TMs without erythema, bulging. No erythema, swelling, or exudate on post pharynx.  Tonsils not swollen or erythematous. Hearing normal.  Neck: Supple, thyroid normal.  Respiratory: Respiratory effort normal, BS equal bilaterally without rales, rhonchi, wheezing or stridor.  Cardio: RRR with no MRGs. Brisk peripheral pulses without edema.  Abdomen: Soft, + BS. + RUQ tenderness, no guarding, rebound, hernias, masses. Lymphatics: Non tender without lymphadenopathy.  Musculoskeletal: Full ROM, 5/5 strength, Normal gait Skin: Warm, dry without rashes, lesions, ecchymosis.  Neuro: Cranial nerves intact. No cerebellar symptoms.  Psych: Awake and oriented X 3, normal affect, Insight and Judgment appropriate.    [name], PA-C 3:41 PM [facility]

## 2014-08-27 NOTE — Patient Instructions (Addendum)
Benefiber is good for constipation/diarrhea/irritable bowel syndrome, it helps with weight loss and can help lower your bad cholesterol. Please do 1-2 TBSP in the morning in water, coffee, or tea. It can take up to a month before you can see a difference with your bowel movements. It is cheapest from [employer], [employer], [employer].   Get on benefiber and can use linsess samples  About Constipation  Constipation Overview Constipation is the most common gastrointestinal complaint - about 4 million Americans experience constipation and make 2.5 million physician visits a year to get help for the problem.  Constipation can occur when the colon absorbs too much water, the colon's muscle contraction is slow or sluggish, and/or there is delayed transit time through the colon.  The result is stool that is hard and dry.  Indicators of constipation include straining during bowel movements greater than 25% of the time, having fewer than three bowel movements per week, and/or the feeling of incomplete evacuation.  There are established guidelines ([place] II ) for defining constipation. A person needs to have two or more of the following symptoms for at least 12 weeks (not necessarily consecutive) in the preceding 12 months: . Straining in  greater than 25% of bowel movements . Lumpy or hard stools in greater than 25% of bowel movements . Sensation of incomplete emptying in greater than 25% of bowel movements . Sensation of anorectal obstruction/blockade in greater than 25% of bowel movements . Manual maneuvers to help empty greater than 25% of bowel movements (e.g., digital evacuation, support of the pelvic floor)  . Less than  3 bowel movements/week . Loose stools are not present, and criteria for irritable bowel syndrome are insufficient  Common Causes of Constipation . Lack of fiber in your diet . Lack of physical activity . Medications, including iron and calcium supplements  . Dairy  intake . Dehydration . Abuse of laxatives  Travel  Irritable Bowel Syndrome  Pregnancy  Luteal phase of menstruation (after ovulation and before menses)  Colorectal problems  Intestinal Dysfunction  Treating Constipation  There are several ways of treating constipation, including changes to diet and exercise, use of laxatives, adjustments to the pelvic floor, and scheduled toileting.  These treatments include: . increasing fiber and fluids in the diet  . increasing physical activity . learning muscle coordination   learning proper toileting techniques and toileting modifications   designing and sticking  to a toileting schedule     2007, [employer] Doc.22  Diabetes is a very complicated disease...lets simplify it.  An easy way to look at it to understand the complications is if you think of the extra sugar floating in your blood stream as glass shards floating through your blood stream.    Diabetes affects your small vessels first: 1) The glass shards (sugar) scraps down the tiny blood vessels in your eyes and lead to diabetic retinopathy, the leading cause of blindness in the [place]. Diabetes is the leading cause of newly diagnosed adult (21 to 61 years of age) blindness in the [place].  2) The glass shards scratches down the tiny vessels of your legs leading to nerve damage called neuropathy and can lead to amputations of your feet. More than 60% of all non-traumatic amputations of lower limbs occur in people with diabetes.  3) Over time the small vessels in your brain are shredded and closed off, individually this does not cause any problems but over a long period of time many of the small vessels being  blocked can lead to Vascular Dementia.   4) Your kidney's are a filter system and have a "net" that keeps certain things in the body and lets bad things out. Sugar shreds this net and leads to kidney damage and eventually failure. Decreasing the sugar that  is destroying the net and certain blood pressure medications can help stop or decrease progression of kidney disease. Diabetes was the primary cause of kidney failure in 44 percent of all new cases in 2011.  5) Diabetes also destroys the small vessels in your penis that lead to erectile dysfunction. Eventually the vessels are so damaged that you may not be responsive to cialis or viagra.   Diabetes and your large vessels: Your larger vessels consist of your coronary arteries in your heart and the carotid vessels to your brain. Diabetes or even increased sugars put you at 300% increased risk of heart attack and stroke and this is why.. The sugar scrapes down your large blood vessels and your body sees this as an internal injury and tries to repair itself. Just like you get a scab on your skin, your platelets will stick to the blood vessel wall trying to heal it. This is why we have diabetics on low dose aspirin daily, this prevents the platelets from sticking and can prevent plaque formation. In addition, your body takes cholesterol and tries to shove it into the open wound. This is why we want your LDL, or bad cholesterol, below 70.   The combination of platelets and cholesterol over 5-10 years forms plaque that can break off and cause a heart attack or stroke.   PLEASE REMEMBER:  Diabetes is preventable! Up to 92 percent of complications and morbidities among individuals with type 2 diabetes can be prevented, delayed, or effectively treated and minimized with regular visits to a health professional, appropriate monitoring and medication, and a healthy diet and lifestyle.     Bad carbs also include fruit juice, alcohol, and sweet tea. These are empty calories that do not signal to your brain that you are full.   Please remember the good carbs are still carbs which convert into sugar. So please measure them out no more than 1/2-1 cup of rice, oatmeal, pasta, and beans  Veggies are however free  foods! Pile them on.   Not all fruit is created equal. Please see the list below, the fruit at the bottom is higher in sugars than the fruit at the top. Please avoid all dried fruits.      Before you even begin to attack a weight-loss plan, it pays to remember this: You are not fat. You have fat. Losing weight isn't about blame or shame; it's simply another achievement to accomplish. Dieting is like any other skill-you have to buckle down and work at it. As long as you act in a smart, reasonable way, you'll ultimately get where you want to be. Here are some weight loss pearls for you.  1. It's Not a Diet. It's a Lifestyle Thinking of a diet as something you're on and suffering through only for the short term doesn't work. To shed weight and keep it off, you need to make permanent changes to the way you eat. It's OK to indulge occasionally, of course, but if you cut calories temporarily and then revert to your old way of eating, you'll gain back the weight quicker than you can say yo-yo. Use it to lose it. Research shows that one of the best predictors of long-term weight  loss is how many pounds you drop in the first month. For that reason, nutritionists often suggest being stricter for the first two weeks of your new eating strategy to build momentum. Cut out added sugar and alcohol and avoid unrefined carbs. After that, figure out how you can reincorporate them in a way that's healthy and maintainable.  2. There's a Right Way to Exercise Working out burns calories and fat and boosts your metabolism by building muscle. But those trying to lose weight are notorious for overestimating the number of calories they burn and underestimating the amount they take in. Unfortunately, your system is biologically programmed to hold on to extra pounds and that means when you start exercising, your body senses the deficit and ramps up its hunger signals. If you're not diligent, you'll eat everything you burn and then  some. Use it to lose it. Cardio gets all the exercise glory, but strength and interval training are the real heroes. They help you build lean muscle, which in turn increases your metabolism and calorie-burning ability 3. Don't Overreact to Mild Hunger Some people have a hard time losing weight because of hunger anxiety. To them, being hungry is bad-something to be avoided at all costs-so they carry snacks with them and eat when they don't need to. Others eat because they're stressed out or bored. While you never want to get to the point of being ravenous (that's when bingeing is likely to happen), a hunger pang, a craving, or the fact that it's 3:00 p.m. should not send you racing for the vending machine or obsessing about the energy bar in your purse. Ideally, you should put off eating until your stomach is growling and it's difficult to concentrate.  Use it to lose it. When you feel the urge to eat, use the HALT method. Ask yourself, Am I really hungry? Or am I angry or anxious, lonely or bored, or tired? If you're still not certain, try the apple test. If you're truly hungry, an apple should seem delicious; if it doesn't, something else is going on. Or you can try drinking water and making yourself busy, if you are still hungry try a healthy snack.  4. Not All Calories Are Created Equal The mechanics of weight loss are pretty simple: Take in fewer calories than you use for energy. But the kind of food you eat makes all the difference. Processed food that's high in saturated fat and refined starch or sugar can cause inflammation that disrupts the hormone signals that tell your brain you're full. The result: You eat a lot more.  Use it to lose it. Clean up your diet. Swap in whole, unprocessed foods, including vegetables, lean protein, and healthy fats that will fill you up and give you the biggest nutritional bang for your calorie buck. In a few weeks, as your brain starts receiving regular hunger and  fullness signals once again, you'll notice that you feel less hungry overall and naturally start cutting back on the amount you eat.  5. Protein, Produce, and Plant-Based Fats Are Your Weight-Loss Trinity Here's why eating the three Ps regularly will help you drop pounds. Protein fills you up. You need it to build lean muscle, which keeps your metabolism humming so that you can torch more fat. People in a weight-loss program who ate double the recommended daily allowance for protein (about 110 grams for a 150-pound woman) lost 70 percent of their weight from fat, while people who ate the RDA lost only about  40 percent, one study found. Produce is packed with filling fiber. "It's very difficult to consume too many calories if you're eating a lot of vegetables. Example: Three cups of broccoli is a lot of food, yet only 93 calories. (Fruit is another story. It can be easy to overeat and can contain a lot of calories from sugar, so be sure to monitor your intake.) Plant-based fats like olive oil and those in avocados and nuts are healthy and extra satiating.  Use it to lose it. Aim to incorporate each of the three Ps into every meal and snack. People who eat protein throughout the day are able to keep weight off, according to a study in the [place] of Clinical Nutrition. In addition to meat, poultry and seafood, good sources are beans, lentils, eggs, tofu, and yogurt. As for fat, keep portion sizes in check by measuring out salad dressing, oil, and nut butters (shoot for one to two tablespoons). Finally, eat veggies or a little fruit at every meal. People who did that consumed 308 fewer calories but didn't feel any hungrier than when they didn't eat more produce.  7. How You Eat Is As Important As What You Eat In order for your brain to register that you're full, you need to focus on what you're eating. Sit down whenever you eat, preferably at a table. Turn off the TV or computer, put down your phone,  and look at your food. Smell it. Chew slowly, and don't put another bite on your fork until you swallow. When women ate lunch this attentively, they consumed 30 percent less when snacking later than those who listened to an audiobook at lunchtime, according to a study in the [place] of Nutrition. 8. Weighing Yourself Really Works The scale provides the best evidence about whether your efforts are paying off. Seeing the numbers tick up or down or stagnate is motivation to keep going-or to rethink your approach. A 2015 study at [facility] found that daily weigh-ins helped people lose more weight, keep it off, and maintain that loss, even after two years. Use it to lose it. Step on the scale at the same time every day for the best results. If your weight shoots up several pounds from one weigh-in to the next, don't freak out. Eating a lot of salt the night before or having your period is the likely culprit. The number should return to normal in a day or two. It's a steady climb that you need to do something about. 9. Too Much Stress and Too Little Sleep Are Your Enemies When you're tired and frazzled, your body cranks up the production of cortisol, the stress hormone that can cause carb cravings. Not getting enough sleep also boosts your levels of ghrelin, a hormone associated with hunger, while suppressing leptin, a hormone that signals fullness and satiety. People on a diet who slept only five and a half hours a night for two weeks lost 55 percent less fat and were hungrier than those who slept eight and a half hours, according to a study in the [place]. Use it to lose it. Prioritize sleep, aiming for seven hours or more a night, which research shows helps lower stress. And make sure you're getting quality zzz's. If a snoring spouse or a fidgety cat wakes you up frequently throughout the night, you may end up getting the equivalent of just four hours of sleep,  according to a study from [facility]. Keep pets  out of the bedroom, and use a white-noise app to drown out snoring. 10. You Will Hit a plateau-And You Can Bust Through It As you slim down, your body releases much less leptin, the fullness hormone.  If you're not strength training, start right now. Building muscle can raise your metabolism to help you overcome a plateau. To keep your body challenged and burning calories, incorporate new moves and more intense intervals into your workouts or add another sweat session to your weekly routine. Alternatively, cut an extra 100 calories or so a day from your diet. Now that you've lost weight, your body simply doesn't need as much fuel.

## 2014-08-28 LAB — LIPID PANEL
CHOL/HDL RATIO: 10 ratio
CHOLESTEROL: 230 mg/dL — AB (ref 0–200)
HDL: 23 mg/dL — ABNORMAL LOW (ref >=46)
Triglycerides: 506 mg/dL — ABNORMAL HIGH (ref <150)

## 2014-08-28 LAB — HEPATIC FUNCTION PANEL
ALT: 12 U/L (ref 0–35)
AST: 12 U/L (ref 0–37)
Albumin: 4.1 g/dL (ref 3.5–5.2)
Alkaline Phosphatase: 75 U/L (ref 39–117)
BILIRUBIN DIRECT: 0.1 mg/dL (ref 0.0–0.3)
BILIRUBIN INDIRECT: 0.3 mg/dL (ref 0.2–1.2)
TOTAL PROTEIN: 7 g/dL (ref 6.0–8.3)
Total Bilirubin: 0.4 mg/dL (ref 0.2–1.2)

## 2014-08-28 LAB — BASIC METABOLIC PANEL WITH GFR
BUN: 15 mg/dL (ref 6–23)
CALCIUM: 9.7 mg/dL (ref 8.4–10.5)
CO2: 23 meq/L (ref 19–32)
Chloride: 102 mEq/L (ref 96–112)
Creat: 0.94 mg/dL (ref 0.50–1.10)
GFR, EST NON AFRICAN AMERICAN: 66 mL/min
GFR, Est African American: 76 mL/min
Glucose, Bld: 200 mg/dL — ABNORMAL HIGH (ref 70–99)
POTASSIUM: 4.4 meq/L (ref 3.5–5.3)
SODIUM: 136 meq/L (ref 135–145)

## 2014-08-28 LAB — INSULIN, FASTING: Insulin fasting, serum: 26.6 u[IU]/mL — ABNORMAL HIGH (ref 2.0–19.6)

## 2014-08-28 LAB — VITAMIN D 25 HYDROXY (VIT D DEFICIENCY, FRACTURES): Vit D, 25-Hydroxy: 10 ng/mL — ABNORMAL LOW (ref 30–100)

## 2014-08-28 LAB — AMYLASE: Amylase: 36 U/L (ref 0–105)

## 2014-08-28 LAB — HEMOGLOBIN A1C
HEMOGLOBIN A1C: 9.2 % — AB (ref <5.7)
MEAN PLASMA GLUCOSE: 217 mg/dL — AB (ref <117)

## 2014-08-28 LAB — GAMMA GT: GGT: 20 U/L (ref 7–51)

## 2014-08-28 LAB — MAGNESIUM: Magnesium: 2 mg/dL (ref 1.5–2.5)

## 2014-08-28 LAB — TSH: TSH: 2.958 u[IU]/mL (ref 0.350–4.500)

## 2014-08-29 ENCOUNTER — Other Ambulatory Visit: Payer: Self-pay

## 2014-08-29 ENCOUNTER — Encounter: Payer: Self-pay | Admitting: Physician Assistant

## 2014-08-29 MED ORDER — SERTRALINE HCL 100 MG PO TABS: 100.0000 mg | ORAL_TABLET | Freq: Every day | ORAL | Status: DC

## 2014-09-10 ENCOUNTER — Encounter: Payer: Self-pay | Admitting: Internal Medicine

## 2014-09-10 ENCOUNTER — Ambulatory Visit (INDEPENDENT_AMBULATORY_CARE_PROVIDER_SITE_OTHER): Payer: BLUE CROSS/BLUE SHIELD | Admitting: Internal Medicine

## 2014-09-10 VITALS — BP 122/64 | HR 86 | Temp 98.0°F | Resp 18 | Ht 63.0 in | Wt 169.0 lb

## 2014-09-10 DIAGNOSIS — R300 Dysuria: Secondary | ICD-10-CM

## 2014-09-10 DIAGNOSIS — R1011 Right upper quadrant pain: Secondary | ICD-10-CM

## 2014-09-10 DIAGNOSIS — D72829 Elevated white blood cell count, unspecified: Secondary | ICD-10-CM

## 2014-09-10 LAB — URINALYSIS, ROUTINE W REFLEX MICROSCOPIC
Bilirubin Urine: NEGATIVE
Glucose, UA: 100 mg/dL — AB
HGB URINE DIPSTICK: NEGATIVE
Ketones, ur: NEGATIVE mg/dL
LEUKOCYTES UA: NEGATIVE
NITRITE: NEGATIVE
Protein, ur: NEGATIVE mg/dL
Specific Gravity, Urine: 1.015 (ref 1.005–1.030)
Urobilinogen, UA: 0.2 mg/dL (ref 0.0–1.0)
pH: 5.5 (ref 5.0–8.0)

## 2014-09-10 LAB — CBC WITH DIFFERENTIAL/PLATELET
BASOS PCT: 0 % (ref 0–1)
Basophils Absolute: 0 10*3/uL (ref 0.0–0.1)
EOS PCT: 2 % (ref 0–5)
Eosinophils Absolute: 0.3 10*3/uL (ref 0.0–0.7)
HEMATOCRIT: 39.6 % (ref 36.0–46.0)
HEMOGLOBIN: 12.9 g/dL (ref 12.0–15.0)
LYMPHS PCT: 30 % (ref 12–46)
Lymphs Abs: 4.5 10*3/uL — ABNORMAL HIGH (ref 0.7–4.0)
MCH: 19.3 pg — AB (ref 26.0–34.0)
MCHC: 32.6 g/dL (ref 30.0–36.0)
MCV: 59.1 fL — ABNORMAL LOW (ref 78.0–100.0)
MONOS PCT: 4 % (ref 3–12)
MPV: 9.3 fL (ref 8.6–12.4)
Monocytes Absolute: 0.6 10*3/uL (ref 0.1–1.0)
NEUTROS ABS: 9.7 10*3/uL — AB (ref 1.7–7.7)
Neutrophils Relative %: 64 % (ref 43–77)
PLATELETS: 396 10*3/uL (ref 150–400)
RBC: 6.7 MIL/uL — ABNORMAL HIGH (ref 3.87–5.11)
RDW: 16.3 % — ABNORMAL HIGH (ref 11.5–15.5)
WBC: 15.1 10*3/uL — ABNORMAL HIGH (ref 4.0–10.5)

## 2014-09-10 LAB — COMPLETE METABOLIC PANEL WITH GFR
ALBUMIN: 4.3 g/dL (ref 3.5–5.2)
ALK PHOS: 84 U/L (ref 39–117)
ALT: 13 U/L (ref 0–35)
AST: 13 U/L (ref 0–37)
BUN: 12 mg/dL (ref 6–23)
CALCIUM: 9.5 mg/dL (ref 8.4–10.5)
CHLORIDE: 101 meq/L (ref 96–112)
CO2: 24 meq/L (ref 19–32)
Creat: 0.7 mg/dL (ref 0.50–1.10)
GLUCOSE: 215 mg/dL — AB (ref 70–99)
Potassium: 4.6 mEq/L (ref 3.5–5.3)
SODIUM: 135 meq/L (ref 135–145)
Total Bilirubin: 0.8 mg/dL (ref 0.2–1.2)
Total Protein: 7.3 g/dL (ref 6.0–8.3)

## 2014-09-10 MED ORDER — PHENAZOPYRIDINE HCL 100 MG PO TABS: 100.0000 mg | ORAL_TABLET | Freq: Three times a day (TID) | ORAL | Status: DC | PRN

## 2014-09-10 MED ORDER — NITROFURANTOIN MONOHYD MACRO 100 MG PO CAPS: 100.0000 mg | ORAL_CAPSULE | Freq: Two times a day (BID) | ORAL | Status: DC

## 2014-09-10 NOTE — Progress Notes (Signed)
   Subjective:    Patient ID: Dorothy Boyd, female    DOB: 1955/11/14, 59 y.o.   MRN: [id number]  HPI  Patient is a 59 y.o. Female who presents to the office for evaluation of RUQ pain and also for evaluation of possible UTI.  She reports that she has intermittent dull aching 4/10 pain in her RUQ x 2 months.  She reports that nothing makes the pain better.  No aggravating factors.  She is status post chole.  She has reported some urinary symptoms for the last 4 days.  She reports burning, itching, and some foul smell.  She reports fever 2 weeks ago.    Review of Systems  Constitutional: Positive for fever. Negative for chills.  Gastrointestinal: Positive for nausea, vomiting, abdominal pain and constipation. Negative for diarrhea, blood in stool and anal bleeding.  Genitourinary: Positive for dysuria, urgency and frequency. Negative for hematuria, flank pain, vaginal bleeding, vaginal discharge and difficulty urinating.  Musculoskeletal: Negative for back pain.       Objective:   Physical Exam  Constitutional: She is oriented to person, place, and time. She appears well-developed and well-nourished. No distress.  HENT:  Head: Normocephalic and atraumatic.  Mouth/Throat: Oropharynx is clear and moist. No oropharyngeal exudate.  Eyes: Conjunctivae and EOM are normal. Pupils are equal, round, and reactive to light. No scleral icterus.  Neck: Normal range of motion. Neck supple. No JVD present. No thyromegaly present.  Cardiovascular: Normal rate, regular rhythm, normal heart sounds and intact distal pulses.  Exam reveals no gallop and no friction rub.   No murmur heard. Pulmonary/Chest: Effort normal and breath sounds normal. No respiratory distress. She has no wheezes. She has no rales. She exhibits no tenderness.  Abdominal: Soft. Normal appearance and bowel sounds are normal. She exhibits no distension and no mass. There is tenderness in the suprapubic area. There is no rigidity, no  rebound, no guarding, no CVA tenderness, no tenderness at McBurney's point and negative Murphy's sign.    Musculoskeletal: Normal range of motion.  Lymphadenopathy:    She has no cervical adenopathy.  Neurological: She is alert and oriented to person, place, and time.  Skin: Skin is warm and dry. She is not diaphoretic.  Psychiatric: She has a normal mood and affect. Her behavior is normal. Judgment and thought content normal.  Nursing note and vitals reviewed.         Assessment & Plan:    1. Dysuria  - nitrofurantoin, macrocrystal-monohydrate, (MACROBID) 100 MG capsule; Take 1 capsule (100 mg total) by mouth 2 (two) times daily. X 7 days  Dispense: 14 capsule; Refill: 0 - phenazopyridine (PYRIDIUM) 100 MG tablet; Take 1 tablet (100 mg total) by mouth 3 (three) times daily as needed for pain.  Dispense: 20 tablet; Refill: 0 - Urinalysis, Routine w reflex microscopic - Culture, Urine - CBC with Diff - CMP with Estimated GFR (CPT-80053)  2. Leukocytosis -recheck - CBC with Diff  3. RUQ pain -ddx includes pancreatitis secondary to diabetes meds, and possible CBD stone.  There is also musculoskeletal pain, Nephrolithiasis.   - CMP with Estimated GFR (CPT-80053) - US Abdomen Complete; Future

## 2014-09-10 NOTE — Patient Instructions (Signed)

## 2014-09-11 ENCOUNTER — Encounter: Payer: Self-pay | Admitting: Internal Medicine

## 2014-09-12 ENCOUNTER — Encounter: Payer: Self-pay | Admitting: Internal Medicine

## 2014-09-13 LAB — URINE CULTURE

## 2014-09-16 ENCOUNTER — Encounter: Payer: Self-pay | Admitting: Internal Medicine

## 2014-09-16 ENCOUNTER — Other Ambulatory Visit: Payer: Self-pay | Admitting: Physician Assistant

## 2014-09-16 MED ORDER — CIPROFLOXACIN HCL 250 MG PO TABS: 250.0000 mg | ORAL_TABLET | Freq: Two times a day (BID) | ORAL | Status: AC

## 2014-09-16 MED ORDER — FLUCONAZOLE 150 MG PO TABS: 150.0000 mg | ORAL_TABLET | Freq: Once | ORAL | Status: DC

## 2014-09-23 ENCOUNTER — Encounter: Payer: Self-pay | Admitting: Physician Assistant

## 2014-09-23 ENCOUNTER — Ambulatory Visit (HOSPITAL_COMMUNITY)
Admission: RE | Admit: 2014-09-23 | Discharge: 2014-09-23 | Disposition: A | Discharge status: Home / Self Care | Payer: BLUE CROSS/BLUE SHIELD | Source: Ambulatory Visit | Attending: Internal Medicine | Admitting: Internal Medicine

## 2014-09-23 DIAGNOSIS — Z9049 Acquired absence of other specified parts of digestive tract: Secondary | ICD-10-CM | POA: Insufficient documentation

## 2014-09-23 DIAGNOSIS — R1011 Right upper quadrant pain: Secondary | ICD-10-CM

## 2014-09-23 DIAGNOSIS — R109 Unspecified abdominal pain: Secondary | ICD-10-CM | POA: Insufficient documentation

## 2014-09-23 DIAGNOSIS — Z85038 Personal history of other malignant neoplasm of large intestine: Secondary | ICD-10-CM | POA: Diagnosis not present

## 2014-09-23 DIAGNOSIS — R112 Nausea with vomiting, unspecified: Secondary | ICD-10-CM | POA: Insufficient documentation

## 2014-09-28 ENCOUNTER — Encounter: Payer: Self-pay | Admitting: Physician Assistant

## 2014-09-29 ENCOUNTER — Other Ambulatory Visit: Payer: Self-pay | Admitting: Internal Medicine

## 2014-09-29 MED ORDER — CITALOPRAM HYDROBROMIDE 40 MG PO TABS: 40.0000 mg | ORAL_TABLET | Freq: Every day | ORAL | Status: DC

## 2014-09-29 NOTE — Progress Notes (Signed)
Patient lost her Rx for Zoloft & [facility]' cost for #90 pills Zoloft is $140.00 - so sent in Rx Citalopram 40 mg #90 to [facility] to replace Zoloft

## 2014-10-04 ENCOUNTER — Other Ambulatory Visit: Payer: Self-pay | Admitting: Physician Assistant

## 2014-11-25 ENCOUNTER — Other Ambulatory Visit: Payer: Self-pay

## 2014-11-27 ENCOUNTER — Ambulatory Visit: Payer: Self-pay | Admitting: Physician Assistant

## 2015-03-19 ENCOUNTER — Encounter: Payer: Self-pay | Admitting: Physician Assistant

## 2015-03-19 ENCOUNTER — Ambulatory Visit: Payer: Self-pay | Admitting: Physician Assistant

## 2015-03-19 ENCOUNTER — Ambulatory Visit (INDEPENDENT_AMBULATORY_CARE_PROVIDER_SITE_OTHER): Payer: Self-pay | Admitting: Physician Assistant

## 2015-03-19 VITALS — BP 130/60 | HR 92 | Temp 97.2°F | Resp 16 | Ht 63.0 in | Wt 168.0 lb

## 2015-03-19 DIAGNOSIS — E785 Hyperlipidemia, unspecified: Secondary | ICD-10-CM

## 2015-03-19 DIAGNOSIS — Z794 Long term (current) use of insulin: Secondary | ICD-10-CM

## 2015-03-19 DIAGNOSIS — R112 Nausea with vomiting, unspecified: Secondary | ICD-10-CM

## 2015-03-19 DIAGNOSIS — R1032 Left lower quadrant pain: Secondary | ICD-10-CM

## 2015-03-19 DIAGNOSIS — C189 Malignant neoplasm of colon, unspecified: Secondary | ICD-10-CM

## 2015-03-19 DIAGNOSIS — E559 Vitamin D deficiency, unspecified: Secondary | ICD-10-CM

## 2015-03-19 DIAGNOSIS — E119 Type 2 diabetes mellitus without complications: Secondary | ICD-10-CM

## 2015-03-19 DIAGNOSIS — R350 Frequency of micturition: Secondary | ICD-10-CM

## 2015-03-19 MED ORDER — CIPROFLOXACIN HCL 500 MG PO TABS: 500.0000 mg | ORAL_TABLET | Freq: Two times a day (BID) | ORAL | Status: AC

## 2015-03-19 MED ORDER — CITALOPRAM HYDROBROMIDE 40 MG PO TABS: 40.0000 mg | ORAL_TABLET | Freq: Every day | ORAL | Status: DC

## 2015-03-19 MED ORDER — METRONIDAZOLE 500 MG PO TABS: 500.0000 mg | ORAL_TABLET | Freq: Three times a day (TID) | ORAL | Status: AC

## 2015-03-19 MED ORDER — PROMETHAZINE HCL 25 MG PO TABS: 25.0000 mg | ORAL_TABLET | Freq: Four times a day (QID) | ORAL | Status: DC | PRN

## 2015-03-19 NOTE — Patient Instructions (Signed)
Diverticulitis °Diverticulitis is inflammation or infection of small pouches in your colon that form when you have a condition called diverticulosis. The pouches in your colon are called diverticula. Your colon, or large intestine, is where water is absorbed and stool is formed. °Complications of diverticulitis can include: °· Bleeding. °· Severe infection. °· Severe pain. °· Perforation of your colon. °· Obstruction of your colon. °CAUSES  °Diverticulitis is caused by bacteria. °Diverticulitis happens when stool becomes trapped in diverticula. This allows bacteria to grow in the diverticula, which can lead to inflammation and infection. °RISK FACTORS °People with diverticulosis are at risk for diverticulitis. Eating a diet that does not include enough fiber from fruits and vegetables may make diverticulitis more likely to develop. °SYMPTOMS  °Symptoms of diverticulitis may include: °· Abdominal pain and tenderness. The pain is normally located on the left side of the abdomen, but may occur in other areas. °· Fever and chills. °· Bloating. °· Cramping. °· Nausea. °· Vomiting. °· Constipation. °· Diarrhea. °· Blood in your stool. °DIAGNOSIS  °Your health care provider will ask you about your medical history and do a physical exam. You may need to have tests done because many medical conditions can cause the same symptoms as diverticulitis. Tests may include: °· Blood tests. °· Urine tests. °· Imaging tests of the abdomen, including X-rays and CT scans. °When your condition is under control, your health care provider may recommend that you have a colonoscopy. A colonoscopy can show how severe your diverticula are and whether something else is causing your symptoms. °TREATMENT  °Most cases of diverticulitis are mild and can be treated at home. Treatment may include: °· Taking over-the-counter pain medicines. °· Following a clear liquid diet. °· Taking antibiotic medicines by mouth for 7-10 days. °More severe cases may  be treated at a hospital. Treatment may include: °· Not eating or drinking. °· Taking prescription pain medicine. °· Receiving antibiotic medicines through an IV tube. °· Receiving fluids and nutrition through an IV tube. °· Surgery. °HOME CARE INSTRUCTIONS  °· Follow your health care provider's instructions carefully. °· Follow a full liquid diet or other diet as directed by your health care provider. After your symptoms improve, your health care provider may tell you to change your diet. He or she may recommend you eat a high-fiber diet. Fruits and vegetables are good sources of fiber. Fiber makes it easier to pass stool. °· Take fiber supplements or probiotics as directed by your health care provider. °· Only take medicines as directed by your health care provider. °· Keep all your follow-up appointments. °SEEK MEDICAL CARE IF:  °· Your pain does not improve. °· You have a hard time eating food. °· Your bowel movements do not return to normal. °SEEK IMMEDIATE MEDICAL CARE IF:  °· Your pain becomes worse. °· Your symptoms do not get better. °· Your symptoms suddenly get worse. °· You have a fever. °· You have repeated vomiting. °· You have bloody or black, tarry stools. °MAKE SURE YOU:  °· Understand these instructions. °· Will watch your condition. °· Will get help right away if you are not doing well or get worse. °  °This information is not intended to replace advice given to you by your health care provider. Make sure you discuss any questions you have with your health care provider. °  °Document Released: 02/24/2005 Document Revised: 05/22/2013 Document Reviewed: 04/11/2013 °Elsevier Interactive Patient Education ©2016 Elsevier Inc[employer] ° °

## 2015-03-19 NOTE — Progress Notes (Signed)
Assessment and Plan:  1. Hypertension -Continue medication, monitor blood pressure at home. Continue DASH diet.  Reminder to go to the ER if any CP, SOB, nausea, dizziness, severe HA, changes vision/speech, left arm numbness and tingling and jaw pain.  2. Cholesterol -Continue diet and exercise. Check cholesterol.   3. Diabetes without complications -Continue diet and exercise. Check A1C  4. Vitamin D Def - check level and continue medications.   5. Depression Continue celexa  6. LLQ pain with some rebound tenderness, stable vitals, history of colon cancer DDX: diverticulitis, partial obstruction with AB surgery hx, UTI/pyelo, ovarian cyst Patient declines ER, will treat out patient with Cipro/Flaygl Get CMET, CBC, UA C&S If worsening pain, N/V go to ER  7. Cutaneous horn versus Basal cell carcinoma Will schedule removal with Dr. Melford Aase   Continue diet and meds as discussed. Further disposition pending results of labs. Discussed med's effects and SE's.   Over 30 minutes of exam, counseling, chart review, and critical decision making was performed   HPI 59 y.o. female  presents for overdue follow up, she has lost her insurance due to being cut back to part time and it would be 300 dollars a month to keep it so she does not have insurance at this time. She has been off all of her medications for 2 months ago, has been taking her husband's nesina samples.   Her blood pressure has been controlled at home, today her BP is BP: 130/60 mmHg.  She does not workout. She denies chest pain, shortness of breath, dizziness.  She is not on cholesterol medication and denies myalgias. Her cholesterol is not at goal. The cholesterol was:   Lab Results  Component Value Date   CHOL 230* 08/27/2014   HDL 23* 08/27/2014   LDLCALC NOT CALC 08/27/2014   TRIG 506* 08/27/2014   CHOLHDL 10.0 08/27/2014    She has been working on diet and exercise for diabetes without complications, she is not on  bASA due to easy bruising/bleeding, she is not on ACE/ARB, and denies  paresthesia of the feet, polydipsia, polyuria and visual disturbances. Last A1C was:  Lab Results  Component Value Date   HGBA1C 9.2* 08/27/2014   Last GFR: Lab Results  Component Value Date   GFRNONAA >89 09/10/2014    Patient is on Vitamin D supplement. Lab Results  Component Value Date   VD25OH 10* 08/27/2014   She is on celexa for depression/anxiety.  She has had nausea x Sunday with some left lower quadrant cramping/pain, vomited x 1 this AM. She has an appetite but will get nausea with eating. May have had fever Monday morning, low grade. + constipation, last BM yesterday, no dark black stool/blood in stool, pain worse with movement, she has worsening dysuria, frequency x 3 days.  Denies heart burn, diarrhea, fever. History of colon cancer last colonoscopy 3 years ago, s/p appendectomy/choley. Had AB pain in June of this year, had normal CT AB in the ER at [facility].   Current Medications:  Current Outpatient Prescriptions on File Prior to Visit  Medication Sig Dispense Refill  . citalopram (CELEXA) 40 MG tablet Take 1 tablet (40 mg total) by mouth daily. 90 tablet 2   No current facility-administered medications on file prior to visit.   Medical History:  Past Medical History  Diagnosis Date  . Hyperlipidemia   . Depression   . Anxiety   . Vitamin D deficiency   . Colon cancer ([place])   . Diabetes (  [place])   . Cervical dysplasia    Allergies:  Allergies  Allergen Reactions  . Codeine   . Lunesta [Eszopiclone]     Excessive fatigue  . Welchol [Colesevelam Hcl]     nausea      Review of Systems:  Review of Systems  Constitutional: Positive for fever and chills. Negative for weight loss, malaise/fatigue and diaphoresis.  HENT: Negative.   Respiratory: Negative.   Cardiovascular: Negative.   Gastrointestinal: Positive for nausea, vomiting, abdominal pain and constipation. Negative for heartburn,  diarrhea, blood in stool and melena.  Genitourinary: Negative.   Musculoskeletal: Negative.   Skin: Negative.        Right foot nodule x 3 months, getting larger  Neurological: Negative for weakness.  Psychiatric/Behavioral: Negative.     Family history- Review and unchanged Social history- Review and unchanged Physical Exam: BP 130/60 mmHg  Pulse 92  Temp(Src) 97.2 F (36.2 C) (Temporal)  Resp 16  Ht 5\' 3"  (1.6 m)  Wt 168 lb (76.204 kg)  BMI 29.77 kg/m2  SpO2 95% Wt Readings from Last 3 Encounters:  03/19/15 168 lb (76.204 kg)  09/10/14 169 lb (76.658 kg)  08/27/14 172 lb (78.019 kg)   General Appearance: Well nourished, in no apparent distress. Eyes: PERRLA, EOMs, conjunctiva no swelling or erythema Sinuses: No Frontal/maxillary tenderness ENT/Mouth: Ext aud canals clear, TMs without erythema, bulging. No erythema, swelling, or exudate on post pharynx.  Tonsils not swollen or erythematous. Hearing normal.  Neck: Supple, thyroid normal.  Respiratory: Respiratory effort normal, BS equal bilaterally without rales, rhonchi, wheezing or stridor.  Cardio: RRR with no MRGs. Brisk peripheral pulses without edema.  Abdomen: Soft, decreased BS, + LLQ tender, with mild rebound and obturator sign, hernias, masses. Lymphatics: Non tender without lymphadenopathy.  Musculoskeletal: Full ROM, 5/5 strength, Normal gait Skin: right lateral foot at MCP, erythematous cutaneous nodule, tender to touch. Warm, dry without rashes, ecchymosis.  Neuro: Cranial nerves intact. No cerebellar symptoms.  Psych: Awake and oriented X 3, normal affect, Insight and Judgment appropriate.    [name], PA-C 3:12 PM [facility]

## 2015-03-20 LAB — URINALYSIS, ROUTINE W REFLEX MICROSCOPIC
Bilirubin Urine: NEGATIVE
Hgb urine dipstick: NEGATIVE
Ketones, ur: NEGATIVE
LEUKOCYTES UA: NEGATIVE
Nitrite: NEGATIVE
Protein, ur: NEGATIVE
Specific Gravity, Urine: 1.025 (ref 1.001–1.035)
pH: 5.5 (ref 5.0–8.0)

## 2015-03-20 LAB — CBC WITH DIFFERENTIAL/PLATELET
Basophils Absolute: 0.2 10*3/uL — ABNORMAL HIGH (ref 0.0–0.1)
Basophils Relative: 1 % (ref 0–1)
EOS ABS: 0.3 10*3/uL (ref 0.0–0.7)
Eosinophils Relative: 2 % (ref 0–5)
HCT: 38.8 % (ref 36.0–46.0)
HEMOGLOBIN: 12.4 g/dL (ref 12.0–15.0)
Lymphocytes Relative: 34 % (ref 12–46)
Lymphs Abs: 5.7 10*3/uL — ABNORMAL HIGH (ref 0.7–4.0)
MCH: 19.5 pg — ABNORMAL LOW (ref 26.0–34.0)
MCHC: 32 g/dL (ref 30.0–36.0)
MCV: 61.1 fL — ABNORMAL LOW (ref 78.0–100.0)
MONOS PCT: 3 % (ref 3–12)
MPV: 9.1 fL (ref 8.6–12.4)
Monocytes Absolute: 0.5 10*3/uL (ref 0.1–1.0)
NEUTROS ABS: 10 10*3/uL — AB (ref 1.7–7.7)
Neutrophils Relative %: 60 % (ref 43–77)
Platelets: 389 10*3/uL (ref 150–400)
RBC: 6.35 MIL/uL — ABNORMAL HIGH (ref 3.87–5.11)
RDW: 15.5 % (ref 11.5–15.5)
WBC: 16.7 10*3/uL — ABNORMAL HIGH (ref 4.0–10.5)

## 2015-03-20 LAB — HEMOGLOBIN A1C
Hgb A1c MFr Bld: 8.9 % — ABNORMAL HIGH (ref <5.7)
MEAN PLASMA GLUCOSE: 209 mg/dL — AB (ref <117)

## 2015-03-20 LAB — COMPREHENSIVE METABOLIC PANEL
ALBUMIN: 4.1 g/dL (ref 3.6–5.1)
ALT: 14 U/L (ref 6–29)
AST: 12 U/L (ref 10–35)
Alkaline Phosphatase: 89 U/L (ref 33–130)
BUN: 13 mg/dL (ref 7–25)
CALCIUM: 9.6 mg/dL (ref 8.6–10.4)
CHLORIDE: 98 mmol/L (ref 98–110)
CO2: 23 mmol/L (ref 20–31)
CREATININE: 0.69 mg/dL (ref 0.50–0.99)
Glucose, Bld: 254 mg/dL — ABNORMAL HIGH (ref 65–99)
Potassium: 4.6 mmol/L (ref 3.5–5.3)
SODIUM: 133 mmol/L — AB (ref 135–146)
Total Bilirubin: 0.5 mg/dL (ref 0.2–1.2)
Total Protein: 7.4 g/dL (ref 6.1–8.1)

## 2015-03-20 LAB — URINALYSIS, MICROSCOPIC ONLY
Bacteria, UA: NONE SEEN [HPF]
Casts: NONE SEEN [LPF]
Crystals: NONE SEEN [HPF]
RBC / HPF: NONE SEEN RBC/HPF (ref <=2)
Squamous Epithelial / LPF: NONE SEEN [HPF] (ref <=5)
WBC UA: NONE SEEN WBC/HPF (ref <=5)
Yeast: NONE SEEN [HPF]

## 2015-03-20 LAB — URINE CULTURE
COLONY COUNT: NO GROWTH
Organism ID, Bacteria: NO GROWTH

## 2015-03-24 ENCOUNTER — Encounter: Payer: Self-pay | Admitting: Physician Assistant

## 2015-04-09 ENCOUNTER — Encounter: Payer: Self-pay | Admitting: Physician Assistant

## 2015-04-18 ENCOUNTER — Ambulatory Visit: Payer: No Typology Code available for payment source | Admitting: Internal Medicine

## 2015-04-18 ENCOUNTER — Encounter: Payer: Self-pay | Admitting: Internal Medicine

## 2015-04-18 DIAGNOSIS — D485 Neoplasm of uncertain behavior of skin: Secondary | ICD-10-CM

## 2015-04-18 NOTE — Progress Notes (Signed)
  Subjective:    Patient ID: [name], female    DOB: 02/05/1956, 59 y.o.   MRN: [id number]  HPI Patient presents with hx/o nodule at base of R 1st toe increasing in size x 2-3 months. Hx/o Adenoca Colon in 2010.    PMHx, meds/allergies reviewed  Review of Systems non contributory    Objective:   Physical Exam  10 x 10 exophytic firm pink nodule projecting at the dorsal base of the R 1st toe. After informed consent and aseptic prep w/alcohol and local anesthesia w/ 1 ml Maraine 0.5%w/epi, using a # 10 scalpel the lesion was sharply excised in toto to the subcut  with an elliptical bx allowing 2 mm margins. Wound edges were undermined and approximated with # 4 vertical mattress sutures of Nylon 3-0. Then wound margins were aligned and everted with # 4 interrupted sutures of Nylon 3-0. Sterile Abx ung was applied and covered w/sterile dressings and secured. Metatarsal bar to wear in sandal was created with a tongue blade for patient.     Assessment & Plan:   1. Neoplasm of uncertain behavior of skin  - Dermatology pathology  - wound care advised  - ROV 5-6 day for partial suture removal

## 2015-04-29 ENCOUNTER — Ambulatory Visit: Payer: No Typology Code available for payment source | Admitting: Internal Medicine

## 2015-04-29 ENCOUNTER — Encounter: Payer: Self-pay | Admitting: Internal Medicine

## 2015-04-29 VITALS — BP 122/78 | HR 100 | Temp 97.3°F | Resp 16

## 2015-04-29 DIAGNOSIS — E119 Type 2 diabetes mellitus without complications: Secondary | ICD-10-CM

## 2015-04-29 DIAGNOSIS — B079 Viral wart, unspecified: Secondary | ICD-10-CM

## 2015-04-29 DIAGNOSIS — Z794 Long term (current) use of insulin: Secondary | ICD-10-CM

## 2015-04-29 MED ORDER — METFORMIN HCL ER 500 MG PO TB24: ORAL_TABLET | ORAL | Status: DC

## 2015-04-29 NOTE — Progress Notes (Signed)
Subjective:     Patient ID: Dorothy Boyd, female   DOB: 1955/11/07, 59 y.o.   MRN: [id number]  HPI  Patient returns for suture removal after excision of Verucca from dorsum R 1st toe 2 weeks ago.  Also wrt Rx of T2DM, she has hx/o Intolerance to IR Metformin (N) , but apparently tolerated Jentadueto in the past. Sx's of Januvia are running low and she has no Insurance coverage at present.   Review of Systems CBG's are ranging 120-140's on Januvia. Weight is still up.     Objective:   Physical Exam  BP 122/78 mmHg  Pulse 100  Temp(Src) 97.3 F (36.3 C)  Resp 16  Sutures removed R 1st toe w/o difficulty & wound appears healed w/o infection    Assessment:     1. Verucca   2. Type 2 diabetes mellitus without complication, with long-term current use of insulin ([place])     Plan:     Patient is agreeable to trying Metformin XR and gradually titrate dose up from 1 to 2 tablets /day and return in 1 moth for f/u

## 2015-05-02 ENCOUNTER — Encounter: Payer: Self-pay | Admitting: Internal Medicine

## 2015-05-29 ENCOUNTER — Ambulatory Visit: Payer: Self-pay | Admitting: Physician Assistant

## 2015-06-11 ENCOUNTER — Ambulatory Visit (INDEPENDENT_AMBULATORY_CARE_PROVIDER_SITE_OTHER): Payer: No Typology Code available for payment source | Admitting: Physician Assistant

## 2015-06-11 ENCOUNTER — Encounter: Payer: Self-pay | Admitting: Physician Assistant

## 2015-06-11 VITALS — BP 110/70 | HR 70 | Temp 97.0°F | Resp 16 | Ht 63.0 in | Wt 167.2 lb

## 2015-06-11 DIAGNOSIS — F329 Major depressive disorder, single episode, unspecified: Secondary | ICD-10-CM | POA: Diagnosis not present

## 2015-06-11 DIAGNOSIS — E119 Type 2 diabetes mellitus without complications: Secondary | ICD-10-CM | POA: Diagnosis not present

## 2015-06-11 DIAGNOSIS — Z794 Long term (current) use of insulin: Secondary | ICD-10-CM | POA: Diagnosis not present

## 2015-06-11 DIAGNOSIS — I10 Essential (primary) hypertension: Secondary | ICD-10-CM

## 2015-06-11 DIAGNOSIS — E559 Vitamin D deficiency, unspecified: Secondary | ICD-10-CM

## 2015-06-11 DIAGNOSIS — F32A Depression, unspecified: Secondary | ICD-10-CM

## 2015-06-11 DIAGNOSIS — E785 Hyperlipidemia, unspecified: Secondary | ICD-10-CM

## 2015-06-11 DIAGNOSIS — F17200 Nicotine dependence, unspecified, uncomplicated: Secondary | ICD-10-CM

## 2015-06-11 LAB — CBC WITH DIFFERENTIAL/PLATELET
BASOS PCT: 1 % (ref 0–1)
Basophils Absolute: 0.1 10*3/uL (ref 0.0–0.1)
Eosinophils Absolute: 0.3 10*3/uL (ref 0.0–0.7)
Eosinophils Relative: 2 % (ref 0–5)
HCT: 38.4 % (ref 36.0–46.0)
Hemoglobin: 12.6 g/dL (ref 12.0–15.0)
LYMPHS ABS: 4.5 10*3/uL — AB (ref 0.7–4.0)
Lymphocytes Relative: 31 % (ref 12–46)
MCH: 19.8 pg — ABNORMAL LOW (ref 26.0–34.0)
MCHC: 32.8 g/dL (ref 30.0–36.0)
MCV: 60.4 fL — ABNORMAL LOW (ref 78.0–100.0)
MPV: 9 fL (ref 8.6–12.4)
Monocytes Absolute: 0.6 10*3/uL (ref 0.1–1.0)
Monocytes Relative: 4 % (ref 3–12)
NEUTROS PCT: 62 % (ref 43–77)
Neutro Abs: 9.1 10*3/uL — ABNORMAL HIGH (ref 1.7–7.7)
PLATELETS: 374 10*3/uL (ref 150–400)
RBC: 6.36 MIL/uL — AB (ref 3.87–5.11)
RDW: 16 % — ABNORMAL HIGH (ref 11.5–15.5)
WBC: 14.6 10*3/uL — ABNORMAL HIGH (ref 4.0–10.5)

## 2015-06-11 LAB — TSH: TSH: 2.12 u[IU]/mL (ref 0.350–4.500)

## 2015-06-11 MED ORDER — ALOGLIPTIN BENZOATE 25 MG PO TABS: ORAL_TABLET | ORAL | Status: DC

## 2015-06-11 MED ORDER — BUPROPION HCL ER (XL) 150 MG PO TB24
150.0000 mg | ORAL_TABLET | ORAL | Status: DC
Start: 2015-06-11 — End: 2015-07-24

## 2015-06-11 MED ORDER — FLUCONAZOLE 150 MG PO TABS: 150.0000 mg | ORAL_TABLET | Freq: Once | ORAL | Status: DC

## 2015-06-11 NOTE — Patient Instructions (Addendum)
Cut your celexa in half and start on wellburin xl 150mg  for anxiety/to help decrease smoking  Diabetes is a very complicated disease...lets simplify it.  An easy way to look at it to understand the complications is if you think of the extra sugar floating in your blood stream as glass shards floating through your blood stream.    Diabetes affects your small vessels first: 1) The glass shards (sugar) scraps down the tiny blood vessels in your eyes and lead to diabetic retinopathy, the leading cause of blindness in the [place]. Diabetes is the leading cause of newly diagnosed adult (73 to 62 years of age) blindness in the [place].  2) The glass shards scratches down the tiny vessels of your legs leading to nerve damage called neuropathy and can lead to amputations of your feet. More than 60% of all non-traumatic amputations of lower limbs occur in people with diabetes.  3) Over time the small vessels in your brain are shredded and closed off, individually this does not cause any problems but over a long period of time many of the small vessels being blocked can lead to Vascular Dementia.   4) Your kidney's are a filter system and have a "net" that keeps certain things in the body and lets bad things out. Sugar shreds this net and leads to kidney damage and eventually failure. Decreasing the sugar that is destroying the net and certain blood pressure medications can help stop or decrease progression of kidney disease. Diabetes was the primary cause of kidney failure in 44 percent of all new cases in 2011.  5) Diabetes also destroys the small vessels in your penis that lead to erectile dysfunction. Eventually the vessels are so damaged that you may not be responsive to cialis or viagra.   Diabetes and your large vessels: Your larger vessels consist of your coronary arteries in your heart and the carotid vessels to your brain. Diabetes or even increased sugars put you at 300% increased risk of heart  attack and stroke and this is why.. The sugar scrapes down your large blood vessels and your body sees this as an internal injury and tries to repair itself. Just like you get a scab on your skin, your platelets will stick to the blood vessel wall trying to heal it. This is why we have diabetics on low dose aspirin daily, this prevents the platelets from sticking and can prevent plaque formation. In addition, your body takes cholesterol and tries to shove it into the open wound. This is why we want your LDL, or bad cholesterol, below 70.   The combination of platelets and cholesterol over 5-10 years forms plaque that can break off and cause a heart attack or stroke.   PLEASE REMEMBER:  Diabetes is preventable! Up to 68 percent of complications and morbidities among individuals with type 2 diabetes can be prevented, delayed, or effectively treated and minimized with regular visits to a health professional, appropriate monitoring and medication, and a healthy diet and lifestyle.     Bad carbs also include fruit juice, alcohol, and sweet tea. These are empty calories that do not signal to your brain that you are full.   Please remember the good carbs are still carbs which convert into sugar. So please measure them out no more than 1/2-1 cup of rice, oatmeal, pasta, and beans  Veggies are however free foods! Pile them on.   Not all fruit is created equal. Please see the list below, the fruit at  the bottom is higher in sugars than the fruit at the top. Please avoid all dried fruits.      Gastroparesis Gastroparesis, also called delayed gastric emptying, is a condition in which food takes longer than normal to empty from the stomach. The condition is usually long-lasting (chronic). CAUSES This condition may be caused by: 5. An endocrine disorder, such as hypothyroidism or diabetes. Diabetes is the most common cause of this condition. 6. A nervous system disease, such as Parkinson disease or  multiple sclerosis. 7. Cancer, infection, or surgery of the stomach or vagus nerve. 8. A connective tissue disorder, such as scleroderma. 9. Certain medicines. In most cases, the cause is not known. RISK FACTORS This condition is more likely to develop in:  People with certain disorders, including endocrine disorders, eating disorders, amyloidosis, and scleroderma.  People with certain diseases, including Parkinson disease or multiple sclerosis.  People with cancer or infection of the stomach or vagus nerve.  People who have had surgery on the stomach or vagus nerve.  People who take certain medicines.  Women. SYMPTOMS Symptoms of this condition include:  An early feeling of fullness when eating.  Nausea.  Weight loss.  Vomiting.  Heartburn.  Abdominal bloating.  Inconsistent blood glucose levels.  Lack of appetite.  Acid from the stomach coming up into the esophagus (gastroesophageal reflux).  Spasms of the stomach. Symptoms may come and go. DIAGNOSIS This condition is diagnosed with tests, such as:  Tests that check how long it takes food to move through the stomach and intestines. These tests include:  Upper gastrointestinal (GI) series. In this test, X-rays of the intestines are taken after you drink a liquid. The liquid makes the intestines show up better on the X-rays.  Gastric emptying scintigraphy. In this test, scans are taken after you eat food that contains a small amount of radioactive material.  Wireless capsule GI monitoring system. This test involves swallowing a capsule that records information about movement through the stomach.  Gastric manometry. This test measures electrical and muscular activity in the stomach. It is done with a thin tube that is passed down the throat and into the stomach.  Endoscopy. This test checks for abnormalities in the lining of the stomach. It is done with a long, thin tube that is passed down the throat and into  the stomach.  An ultrasound. This test can help rule out gallbladder disease or pancreatitis as a cause of your symptoms. It uses sound waves to take pictures of the inside of your body. TREATMENT There is no cure for gastroparesis. This condition may be managed with:  Treatment of the underlying condition causing the gastroparesis.  Lifestyle changes, including exercise and dietary changes. Dietary changes can include:  Changes in what and when you eat.  Eating smaller meals more often.  Eating low-fat foods.  Eating low-fiber forms of high-fiber foods, such as cooked vegetables instead of raw vegetables.  Having liquid foods in place of solid foods. Liquid foods are easier to digest.  Medicines. These may be given to control nausea and vomiting and to stimulate stomach muscles.  Getting food through a feeding tube. This may be done in severe cases.  A gastric neurostimulator. This is a device that is inserted into the body with surgery. It helps improve stomach emptying and control nausea and vomiting. HOME CARE INSTRUCTIONS  Follow your health care provider's instructions about exercise and diet.  Take medicines only as directed by your health care provider. [place]  CARE IF:  Your symptoms do not improve with treatment.  You have new symptoms. SEEK IMMEDIATE MEDICAL CARE IF:  You have severe abdominal pain that does not improve with treatment.  You have nausea that does not go away.  You cannot keep fluids down.   This information is not intended to replace advice given to you by your health care provider. Make sure you discuss any questions you have with your health care provider.   Document Released: 05/17/2005 Document Revised: 10/01/2014 Document Reviewed: 05/13/2014 Elsevier Interactive Patient Education [employer].

## 2015-06-11 NOTE — Progress Notes (Addendum)
Assessment and Plan:  1. Hypertension -Continue medication, monitor blood pressure at home. Continue DASH diet.  Reminder to go to the ER if any CP, SOB, nausea, dizziness, severe HA, changes vision/speech, left arm numbness and tingling and jaw pain.  2. Cholesterol -Continue diet and exercise. Check cholesterol.   3. Diabetes without complications -Continue diet and exercise. Check A1C - stop MF due to nausea/diarrhea- get on Nesina and glipizide - close follow up 1 month  4. Vitamin D Def - check level and continue medications.   5. Depression Continue celexa  6. Yeast infection Declines wet prep today, will do diflucan, if not resolved agrees to pelvic.   Continue diet and meds as discussed. Further disposition pending results of labs. Discussed med's effects and SE's.   Over 30 minutes of exam, counseling, chart review, and critical decision making was performed   HPI 60 y.o. female  presents follow up for DM, chol, HTN.   Her blood pressure has been controlled at home, today her BP is BP: 110/70 mmHg.  She does not workout. She denies chest pain, shortness of breath, dizziness.  She is not on cholesterol medication and denies myalgias. Her cholesterol is not at goal. The cholesterol was:   Lab Results  Component Value Date   CHOL 230* 08/27/2014   HDL 23* 08/27/2014   LDLCALC NOT CALC 08/27/2014   TRIG 506* 08/27/2014   CHOLHDL 10.0 08/27/2014    She has been working on diet and exercise for diabetes without complications, she is not on bASA due to easy bruising/bleeding, she is not on ACE/ARB, and denies  paresthesia of the feet, polydipsia, polyuria and visual disturbances. Last A1C was:  Lab Results  Component Value Date   HGBA1C 8.9* 03/19/2015   Last GFR: Lab Results  Component Value Date   GFRNONAA >89 09/10/2014    Patient is on Vitamin D supplement. Lab Results  Component Value Date   VD25OH 10* 08/27/2014   She is on celexa for depression/anxiety.   She was started back on metformin, has had nausea with diarrhea, no fever, chills, no AB pain.   Denies heart burn, diarrhea, fever. History of colon cancer last colonoscopy 3 years ago, s/p appendectomy/choley. Had AB pain in June of this year, had normal CT AB in [facility] ER [facility].   Current Medications:  Current Outpatient Prescriptions on File Prior to Visit  Medication Sig Dispense Refill  . citalopram (CELEXA) 40 MG tablet Take 1 tablet (40 mg total) by mouth daily. 90 tablet 2  . metFORMIN (GLUCOPHAGE XR) 500 MG 24 hr tablet Take 1 to 2 tablets 2 x day with food for Diabetes 120 tablet 5  . promethazine (PHENERGAN) 25 MG tablet Take 1 tablet (25 mg total) by mouth every 6 (six) hours as needed for nausea or vomiting. 30 tablet 3   No current facility-administered medications on file prior to visit.   Medical History:  Past Medical History  Diagnosis Date  . Hyperlipidemia   . Depression   . Anxiety   . Vitamin D deficiency   . Colon cancer ([place])   . Diabetes ([place])   . Cervical dysplasia    Allergies:  Allergies  Allergen Reactions  . Codeine   . Lunesta [Eszopiclone]     Excessive fatigue  . Welchol [Colesevelam Hcl]     nausea      Review of Systems:  Review of Systems  Constitutional: Negative for fever, chills, weight loss, malaise/fatigue and diaphoresis.  HENT: Negative.   Respiratory: Negative.   Cardiovascular: Negative.   Gastrointestinal: Positive for nausea and diarrhea. Negative for heartburn, vomiting, abdominal pain, constipation, blood in stool and melena.  Genitourinary: Negative.   Musculoskeletal: Negative.   Skin: Negative.   Neurological: Negative for weakness.  Psychiatric/Behavioral: Negative.     Family history- Review and unchanged Social history- Review and unchanged Physical Exam: BP 110/70 mmHg  Pulse 70  Temp(Src) 97 F (36.1 C) (Temporal)  Resp 16  Ht 5\' 3"  (1.6 m)  Wt 167 lb 3.2 oz (75.841 kg)  BMI 29.63 kg/m2  SpO2  96% Wt Readings from Last 3 Encounters:  06/11/15 167 lb 3.2 oz (75.841 kg)  03/19/15 168 lb (76.204 kg)  09/10/14 169 lb (76.658 kg)   General Appearance: Well nourished, in no apparent distress. Eyes: PERRLA, EOMs, conjunctiva no swelling or erythema Sinuses: No Frontal/maxillary tenderness ENT/Mouth: Ext aud canals clear, TMs without erythema, bulging. No erythema, swelling, or exudate on post pharynx.  Tonsils not swollen or erythematous. Hearing normal.  Neck: Supple, thyroid normal.  Respiratory: Respiratory effort normal, BS equal bilaterally without rales, rhonchi, wheezing or stridor.  Cardio: RRR with no MRGs. Brisk peripheral pulses without edema.  Abdomen: Soft, decreased BS, nontender,without hernias, masses. Lymphatics: Non tender without lymphadenopathy.  Musculoskeletal: Full ROM, 5/5 strength, Normal gait Warm, dry without rashes, ecchymosis.  Neuro: Cranial nerves intact. No cerebellar symptoms.  Psych: Awake and oriented X 3, normal affect, Insight and Judgment appropriate.    [name], PA-C 4:10 PM [facility]

## 2015-06-12 LAB — LIPID PANEL
CHOL/HDL RATIO: 8.5 ratio — AB (ref <=5.0)
Cholesterol: 220 mg/dL — ABNORMAL HIGH (ref 125–200)
HDL: 26 mg/dL — AB (ref >=46)
TRIGLYCERIDES: 499 mg/dL — AB (ref <150)

## 2015-06-12 LAB — HEPATIC FUNCTION PANEL
ALK PHOS: 86 U/L (ref 33–130)
ALT: 13 U/L (ref 6–29)
AST: 11 U/L (ref 10–35)
Albumin: 4.2 g/dL (ref 3.6–5.1)
BILIRUBIN DIRECT: 0.1 mg/dL (ref <=0.2)
BILIRUBIN TOTAL: 0.5 mg/dL (ref 0.2–1.2)
Indirect Bilirubin: 0.4 mg/dL (ref 0.2–1.2)
Total Protein: 6.8 g/dL (ref 6.1–8.1)

## 2015-06-12 LAB — BASIC METABOLIC PANEL WITH GFR
BUN: 14 mg/dL (ref 7–25)
CHLORIDE: 97 mmol/L — AB (ref 98–110)
CO2: 25 mmol/L (ref 20–31)
Calcium: 9.7 mg/dL (ref 8.6–10.4)
Creat: 0.77 mg/dL (ref 0.50–0.99)
GFR, Est Non African American: 84 mL/min (ref >=60)
Glucose, Bld: 339 mg/dL — ABNORMAL HIGH (ref 65–99)
POTASSIUM: 4.5 mmol/L (ref 3.5–5.3)
SODIUM: 134 mmol/L — AB (ref 135–146)

## 2015-06-12 LAB — HEMOGLOBIN A1C
HEMOGLOBIN A1C: 10.9 % — AB (ref <5.7)
Mean Plasma Glucose: 266 mg/dL — ABNORMAL HIGH (ref <117)

## 2015-06-12 MED ORDER — GLIPIZIDE 5 MG PO TABS: 5.0000 mg | ORAL_TABLET | Freq: Every day | ORAL | Status: DC

## 2015-06-12 NOTE — Addendum Note (Signed)
Addended by: [name] R on: 06/12/2015 07:46 AM   Modules accepted: Orders

## 2015-06-15 ENCOUNTER — Encounter: Payer: Self-pay | Admitting: Physician Assistant

## 2015-07-24 ENCOUNTER — Ambulatory Visit (INDEPENDENT_AMBULATORY_CARE_PROVIDER_SITE_OTHER): Payer: No Typology Code available for payment source | Admitting: Physician Assistant

## 2015-07-24 ENCOUNTER — Encounter: Payer: Self-pay | Admitting: Physician Assistant

## 2015-07-24 VITALS — BP 118/60 | HR 68 | Temp 97.5°F | Resp 16 | Ht 63.0 in | Wt 166.2 lb

## 2015-07-24 DIAGNOSIS — F32A Depression, unspecified: Secondary | ICD-10-CM

## 2015-07-24 DIAGNOSIS — F329 Major depressive disorder, single episode, unspecified: Secondary | ICD-10-CM

## 2015-07-24 DIAGNOSIS — F17200 Nicotine dependence, unspecified, uncomplicated: Secondary | ICD-10-CM

## 2015-07-24 DIAGNOSIS — Z794 Long term (current) use of insulin: Secondary | ICD-10-CM | POA: Diagnosis not present

## 2015-07-24 DIAGNOSIS — E119 Type 2 diabetes mellitus without complications: Secondary | ICD-10-CM | POA: Diagnosis not present

## 2015-07-24 MED ORDER — BUPROPION HCL ER (XL) 150 MG PO TB24: 150.0000 mg | ORAL_TABLET | ORAL | Status: DC

## 2015-07-24 NOTE — Progress Notes (Signed)
Assessment and Plan: 1) diabetes uncontrolled- given samples of nesina 25, continue glipizide and 1/2 metformin 500mg , advised to go to [facility] and get meter/strips Is losing insurance and will get new insurance and will start nesina then 2) Depression- continue celexa, could not afford wellbutrin, will print out and given good RX card.   Future Appointments Date Time Provider [place]  09/11/2015 4:30 PM [name], PA-C [id number] None    HPI 60 y.o.female presents for 1 month follow up for sugars. She is on nesina samples, metformin 500mg  1/2 a day due to intolerance, and glipizide 5 mg with food. She states her feet are feeling better, she is not checking her sugars at this time, we don't have any meters in the office.   Lab Results  Component Value Date   HGBA1C 10.9* 06/11/2015    Past Medical History  Diagnosis Date  . Hyperlipidemia   . Depression   . Anxiety   . Vitamin D deficiency   . Colon cancer ([place])   . Diabetes ([place])   . Cervical dysplasia      Allergies  Allergen Reactions  . Codeine   . Lunesta [Eszopiclone]     Excessive fatigue  . Welchol [Colesevelam Hcl]     nausea       Current Outpatient Prescriptions on File Prior to Visit  Medication Sig Dispense Refill  . Alogliptin Benzoate (NESINA) 25 MG TABS 1 pill daily for diabetes 30 tablet 5  . aspirin 81 MG tablet Take 81 mg by mouth every other day.    . citalopram (CELEXA) 40 MG tablet Take 1 tablet (40 mg total) by mouth daily. 90 tablet 2  . glipiZIDE (GLUCOTROL) 5 MG tablet Take 1 tablet (5 mg total) by mouth daily before breakfast. 30 tablet 4  . metFORMIN (GLUCOPHAGE XR) 500 MG 24 hr tablet Take 1 to 2 tablets 2 x day with food for Diabetes 120 tablet 5  . promethazine (PHENERGAN) 25 MG tablet Take 1 tablet (25 mg total) by mouth every 6 (six) hours as needed for nausea or vomiting. 30 tablet 3   No current facility-administered medications on file prior to visit.    ROS: all  negative except above.   Physical Exam: Filed Weights   07/24/15 1623  Weight: 166 lb 3.2 oz (75.388 kg)   BP 118/60 mmHg  Pulse 68  Temp(Src) 97.5 F (36.4 C) (Temporal)  Resp 16  Ht 5\' 3"  (1.6 m)  Wt 166 lb 3.2 oz (75.388 kg)  BMI 29.45 kg/m2  SpO2 96% General Appearance: Well nourished, in no apparent distress. Eyes: PERRLA, EOMs, conjunctiva no swelling or erythema Sinuses: No Frontal/maxillary tenderness ENT/Mouth: Ext aud canals clear, TMs without erythema, bulging. No erythema, swelling, or exudate on post pharynx.  Tonsils not swollen or erythematous. Hearing normal.  Neck: Supple, thyroid normal.  Respiratory: Respiratory effort normal, BS equal bilaterally without rales, rhonchi, wheezing or stridor.  Cardio: RRR with no MRGs. Brisk peripheral pulses without edema.  Abdomen: Soft, + BS.  Non tender, no guarding, rebound, hernias, masses. Lymphatics: Non tender without lymphadenopathy.  Musculoskeletal: Full ROM, 5/5 strength, normal gait.  Skin: Warm, dry without rashes, lesions, ecchymosis.  Neuro: Cranial nerves intact. Normal muscle tone, no cerebellar symptoms. Sensation intact.  Psych: Awake and oriented X 3, normal affect, Insight and Judgment appropriate.     [name], PA-C 4:35 PM [facility]

## 2015-07-24 NOTE — Patient Instructions (Addendum)
Glipizide 5mg  will lower your sugar no matter what, please only take with food/largest meal IF YOU ARE EVER SICK OR NOT EATING DO NOT TAKE THIS MEDICATION   This medication forces your blood sugar down no matter what it is starting at. This can cause diabetics to have to eat more to keep their blood sugar elevated which goes against what our goals are for you.Marland Kitchen Please never take this medication if you are sick or can not eat. A low blood sugar is much more dangerous than a high blood sugar, symptoms include sweating, shaking, nausea, confusion. Your brain needs two things, sugar and oxygen.   Go to [facility] and buy their meter to check your sugar  If your morning sugar is always below 100 then the issue is with your sugar spiking after meals. Try to take your blood sugar approximately 2 hours after eating, this number should be less than 200. If it is not, think about the foods that you ate and better choices you can make.      Bad carbs also include fruit juice, alcohol, and sweet tea. These are empty calories that do not signal to your brain that you are full.   Please remember the good carbs are still carbs which convert into sugar. So please measure them out no more than 1/2-1 cup of rice, oatmeal, pasta, and beans  Veggies are however free foods! Pile them on.   Not all fruit is created equal. Please see the list below, the fruit at the bottom is higher in sugars than the fruit at the top. Please avoid all dried fruits.     Recommendations For Diabetic/Prediabetic Patients:   -  Take medications as prescribed  -  Recommend Dr Fara Olden Fuhrman's book "The End of Diabetes "  And "The End of Dieting"- Can get at  www.[employer].com and encourage also get the Audio CD book  - AVOID Animal products, ie. Meat - red/white, Poultry and Dairy/especially cheese - Exercise at least 5 times a week for 30 minutes or preferably daily.  - No Smoking - Drink less than 2 drinks a day.  - Monitor your feet  for sores - Have yearly Eye Exams - Recommend annual Flu vaccine  - Recommend Pneumovax and Prevnar vaccines - Shingles Vaccine (Zostavax) if over 71 y.o.  Goals:   - BMI less than 24 - Fasting sugar less than 130 or less than 150 if tapering medicines to lose weight  - Systolic BP less than [id number]  - Diastolic BP less than 80 - Bad LDL Cholesterol less than 70 - Triglycerides less than 150

## 2015-09-11 ENCOUNTER — Ambulatory Visit: Payer: Self-pay | Admitting: Physician Assistant

## 2015-09-18 ENCOUNTER — Other Ambulatory Visit: Payer: Self-pay

## 2015-09-18 ENCOUNTER — Ambulatory Visit (INDEPENDENT_AMBULATORY_CARE_PROVIDER_SITE_OTHER): Payer: Self-pay | Admitting: Physician Assistant

## 2015-09-18 ENCOUNTER — Encounter: Payer: Self-pay | Admitting: Physician Assistant

## 2015-09-18 VITALS — BP 100/68 | HR 95 | Temp 97.2°F | Resp 16 | Ht 63.0 in | Wt 165.4 lb

## 2015-09-18 DIAGNOSIS — E119 Type 2 diabetes mellitus without complications: Secondary | ICD-10-CM

## 2015-09-18 DIAGNOSIS — Z794 Long term (current) use of insulin: Secondary | ICD-10-CM

## 2015-09-18 NOTE — Progress Notes (Signed)
Assessment and Plan:  1. Cholesterol -Continue diet and exercise. Check cholesterol with insurance   2. Diabetes with neuropathy -Continue diet and exercise. Check A1C  Continue diet and meds as discussed. Further disposition pending results of labs. Over 30 minutes of exam, counseling, chart review, and critical decision making was performed  HPI 60 y.o. female  presents for 3 month follow up on hypertension, cholesterol, prediabetes, and vitamin D deficiency.   Her blood pressure has been controlled at home, today their BP is BP: 100/68 mmHg  She does not workout. She denies chest pain, shortness of breath, dizziness.  She is not on cholesterol medication and denies myalgias. Her cholesterol is not at goal. The cholesterol last visit was:   Lab Results  Component Value Date   CHOL 220* 06/11/2015   HDL 26* 06/11/2015   LDLCALC NOT CALC 06/11/2015   TRIG 499* 06/11/2015   CHOLHDL 8.5* 06/11/2015    She has been working on diet and exercise for diabetes not controlled with neuropathy, has been decreasing carbs/sweet, no sweet tea/no soda, sugars have been 200 or under, lowest is 123, she has had some bilateral feet numbness,, she is on bASA, not on ACE/ARB due to hypotension she is on 500mg  1/2 daily, nesina, and glipizide 5mg  once a day , and denies hypoglycemia , polydipsia, polyuria and visual disturbances. Last A1C in the office was:  Lab Results  Component Value Date   HGBA1C 10.9* 06/11/2015   Patient is on Vitamin D supplement.   Lab Results  Component Value Date   VD25OH 10* 08/27/2014     She is on celexa, could not afford wellbutrin.   Current Medications:  Current Outpatient Prescriptions on File Prior to Visit  Medication Sig Dispense Refill  . Alogliptin Benzoate (NESINA) 25 MG TABS 1 pill daily for diabetes 30 tablet 5  . aspirin 81 MG tablet Take 81 mg by mouth every other day.    Marland Kitchen glipiZIDE (GLUCOTROL) 5 MG tablet Take 1 tablet (5 mg total) by mouth daily  before breakfast. 30 tablet 4  . metFORMIN (GLUCOPHAGE XR) 500 MG 24 hr tablet Take 1 to 2 tablets 2 x day with food for Diabetes 120 tablet 5  . promethazine (PHENERGAN) 25 MG tablet Take 1 tablet (25 mg total) by mouth every 6 (six) hours as needed for nausea or vomiting. 30 tablet 3   No current facility-administered medications on file prior to visit.   Medical History:  Past Medical History  Diagnosis Date  . Hyperlipidemia   . Depression   . Anxiety   . Vitamin D deficiency   . Colon cancer ([place])   . Diabetes ([place])   . Cervical dysplasia    Allergies:  Allergies  Allergen Reactions  . Codeine   . Lunesta [Eszopiclone]     Excessive fatigue  . Welchol [Colesevelam Hcl]     nausea      Review of Systems:  Review of Systems  Constitutional: Negative for fever, chills, weight loss, malaise/fatigue and diaphoresis.  HENT: Negative.   Respiratory: Negative.   Cardiovascular: Negative.   Gastrointestinal: Negative for heartburn, nausea, vomiting, abdominal pain, diarrhea, constipation, blood in stool and melena.  Genitourinary: Negative.   Musculoskeletal: Negative.   Skin: Negative.   Neurological: Negative for weakness.  Psychiatric/Behavioral: Negative.     Family history- Review and unchanged Social history- Review and unchanged Physical Exam: BP 100/68 mmHg  Pulse 95  Temp(Src) 97.2 F (36.2 C) (Temporal)  Resp 16  Ht 5\' 3"  (1.6 m)  Wt 165 lb 6.4 oz (75.025 kg)  BMI 29.31 kg/m2  SpO2 97% Wt Readings from Last 3 Encounters:  09/18/15 165 lb 6.4 oz (75.025 kg)  07/24/15 166 lb 3.2 oz (75.388 kg)  06/11/15 167 lb 3.2 oz (75.841 kg)   General Appearance: Well nourished, in no apparent distress. Eyes: PERRLA, EOMs, conjunctiva no swelling or erythema Sinuses: No Frontal/maxillary tenderness ENT/Mouth: Ext aud canals clear, TMs without erythema, bulging. No erythema, swelling, or exudate on post pharynx.  Tonsils not swollen or erythematous. Hearing normal.   Neck: Supple, thyroid normal.  Respiratory: Respiratory effort normal, BS equal bilaterally without rales, rhonchi, wheezing or stridor.  Cardio: RRR with no MRGs. Brisk peripheral pulses without edema.  Abdomen: Soft, + BS,  Non tender, no guarding, rebound, hernias, masses. Lymphatics: Non tender without lymphadenopathy.  Musculoskeletal: Full ROM, 5/5 strength, Normal gait Skin: Warm, dry without rashes, lesions, ecchymosis.  Neuro: Cranial nerves intact. Normal muscle tone, no cerebellar symptoms. Psych: Awake and oriented X 3, normal affect, Insight and Judgment appropriate.    [name], PA-C 4:39 PM [facility]

## 2015-09-18 NOTE — Patient Instructions (Signed)
Diabetes is a very complicated disease...lets simplify it.  An easy way to look at it to understand the complications is if you think of the extra sugar floating in your blood stream as glass shards floating through your blood stream.    Diabetes affects your small vessels first: 1) The glass shards (sugar) scraps down the tiny blood vessels in your eyes and lead to diabetic retinopathy, the leading cause of blindness in the [place]. Diabetes is the leading cause of newly diagnosed adult (36 to 62 years of age) blindness in the [place].  2) The glass shards scratches down the tiny vessels of your legs leading to nerve damage called neuropathy and can lead to amputations of your feet. More than 60% of all non-traumatic amputations of lower limbs occur in people with diabetes.  3) Over time the small vessels in your brain are shredded and closed off, individually this does not cause any problems but over a long period of time many of the small vessels being blocked can lead to Vascular Dementia.   4) Your kidney's are a filter system and have a "net" that keeps certain things in the body and lets bad things out. Sugar shreds this net and leads to kidney damage and eventually failure. Decreasing the sugar that is destroying the net and certain blood pressure medications can help stop or decrease progression of kidney disease. Diabetes was the primary cause of kidney failure in 44 percent of all new cases in 2011.  5) Diabetes also destroys the small vessels in your penis that lead to erectile dysfunction. Eventually the vessels are so damaged that you may not be responsive to cialis or viagra.   Diabetes and your large vessels: Your larger vessels consist of your coronary arteries in your heart and the carotid vessels to your brain. Diabetes or even increased sugars put you at 300% increased risk of heart attack and stroke and this is why.. The sugar scrapes down your large blood vessels and your body  sees this as an internal injury and tries to repair itself. Just like you get a scab on your skin, your platelets will stick to the blood vessel wall trying to heal it. This is why we have diabetics on low dose aspirin daily, this prevents the platelets from sticking and can prevent plaque formation. In addition, your body takes cholesterol and tries to shove it into the open wound. This is why we want your LDL, or bad cholesterol, below 70.   The combination of platelets and cholesterol over 5-10 years forms plaque that can break off and cause a heart attack or stroke.   PLEASE REMEMBER:  Diabetes is preventable! Up to 69 percent of complications and morbidities among individuals with type 2 diabetes can be prevented, delayed, or effectively treated and minimized with regular visits to a health professional, appropriate monitoring and medication, and a healthy diet and lifestyle.  About Constipation  Constipation Overview Constipation is the most common gastrointestinal complaint - about 4 million Americans experience constipation and make 2.5 million physician visits a year to get help for the problem.  Constipation can occur when the colon absorbs too much water, the colon's muscle contraction is slow or sluggish, and/or there is delayed transit time through the colon.  The result is stool that is hard and dry.  Indicators of constipation include straining during bowel movements greater than 25% of the time, having fewer than three bowel movements per week, and/or the feeling of incomplete evacuation.  There are established guidelines ([place] II ) for defining constipation. A person needs to have two or more of the following symptoms for at least 12 weeks (not necessarily consecutive) in the preceding 12 months: . Straining in  greater than 25% of bowel movements . Lumpy or hard stools in greater than 25% of bowel movements . Sensation of incomplete emptying in greater than 25% of bowel  movements . Sensation of anorectal obstruction/blockade in greater than 25% of bowel movements . Manual maneuvers to help empty greater than 25% of bowel movements (e.g., digital evacuation, support of the pelvic floor)  . Less than  3 bowel movements/week . Loose stools are not present, and criteria for irritable bowel syndrome are insufficient  Common Causes of Constipation . Lack of fiber in your diet . Lack of physical activity . Medications, including iron and calcium supplements  . Dairy intake . Dehydration . Abuse of laxatives  Travel  Irritable Bowel Syndrome  Pregnancy  Luteal phase of menstruation (after ovulation and before menses)  Colorectal problems  Intestinal Dysfunction  Treating Constipation  There are several ways of treating constipation, including changes to diet and exercise, use of laxatives, adjustments to the pelvic floor, and scheduled toileting.  These treatments include: . increasing fiber and fluids in the diet  . increasing physical activity . learning muscle coordination   learning proper toileting techniques and toileting modifications   designing and sticking  to a toileting schedule     2007, [employer] Doc.22

## 2015-09-19 LAB — HEMOGLOBIN A1C
HEMOGLOBIN A1C: 9 % — AB (ref <5.7)
Mean Plasma Glucose: 212 mg/dL

## 2015-09-19 LAB — BASIC METABOLIC PANEL WITH GFR
BUN: 11 mg/dL (ref 7–25)
CHLORIDE: 101 mmol/L (ref 98–110)
CO2: 25 mmol/L (ref 20–31)
Calcium: 9.5 mg/dL (ref 8.6–10.4)
Creat: 0.77 mg/dL (ref 0.50–0.99)
GFR, EST NON AFRICAN AMERICAN: 84 mL/min (ref >=60)
GFR, Est African American: >89 mL/min (ref >=60)
Glucose, Bld: 166 mg/dL — ABNORMAL HIGH (ref 65–99)
POTASSIUM: 4.2 mmol/L (ref 3.5–5.3)
Sodium: 138 mmol/L (ref 135–146)

## 2015-10-09 ENCOUNTER — Encounter (HOSPITAL_COMMUNITY): Payer: Self-pay | Admitting: Emergency Medicine

## 2015-10-09 ENCOUNTER — Emergency Department (HOSPITAL_COMMUNITY)
Admission: EM | Admit: 2015-10-09 | Discharge: 2015-10-10 | Disposition: A | Discharge status: Home / Self Care | Payer: BLUE CROSS/BLUE SHIELD | Attending: Emergency Medicine | Admitting: Emergency Medicine

## 2015-10-09 ENCOUNTER — Emergency Department (HOSPITAL_COMMUNITY): Payer: BLUE CROSS/BLUE SHIELD

## 2015-10-09 DIAGNOSIS — F17200 Nicotine dependence, unspecified, uncomplicated: Secondary | ICD-10-CM | POA: Insufficient documentation

## 2015-10-09 DIAGNOSIS — S40022A Contusion of left upper arm, initial encounter: Secondary | ICD-10-CM | POA: Insufficient documentation

## 2015-10-09 DIAGNOSIS — S5002XA Contusion of left elbow, initial encounter: Secondary | ICD-10-CM | POA: Insufficient documentation

## 2015-10-09 DIAGNOSIS — Z8742 Personal history of other diseases of the female genital tract: Secondary | ICD-10-CM | POA: Insufficient documentation

## 2015-10-09 DIAGNOSIS — Y92098 Other place in other non-institutional residence as the place of occurrence of the external cause: Secondary | ICD-10-CM | POA: Insufficient documentation

## 2015-10-09 DIAGNOSIS — Y9389 Activity, other specified: Secondary | ICD-10-CM | POA: Insufficient documentation

## 2015-10-09 DIAGNOSIS — F329 Major depressive disorder, single episode, unspecified: Secondary | ICD-10-CM | POA: Insufficient documentation

## 2015-10-09 DIAGNOSIS — Z7982 Long term (current) use of aspirin: Secondary | ICD-10-CM | POA: Insufficient documentation

## 2015-10-09 DIAGNOSIS — F419 Anxiety disorder, unspecified: Secondary | ICD-10-CM | POA: Insufficient documentation

## 2015-10-09 DIAGNOSIS — Z7984 Long term (current) use of oral hypoglycemic drugs: Secondary | ICD-10-CM | POA: Insufficient documentation

## 2015-10-09 DIAGNOSIS — E119 Type 2 diabetes mellitus without complications: Secondary | ICD-10-CM | POA: Insufficient documentation

## 2015-10-09 DIAGNOSIS — Z85038 Personal history of other malignant neoplasm of large intestine: Secondary | ICD-10-CM | POA: Insufficient documentation

## 2015-10-09 DIAGNOSIS — W108XXA Fall (on) (from) other stairs and steps, initial encounter: Secondary | ICD-10-CM | POA: Insufficient documentation

## 2015-10-09 DIAGNOSIS — Y998 Other external cause status: Secondary | ICD-10-CM | POA: Insufficient documentation

## 2015-10-09 MED ORDER — OXYCODONE-ACETAMINOPHEN 5-325 MG PO TABS
1.0000 | ORAL_TABLET | Freq: Once | ORAL | Status: AC
  Administered 2015-10-09: 1 via ORAL
  Filled 2015-10-09: qty 1

## 2015-10-09 NOTE — ED Notes (Signed)
Pt's son stepped out in hall requesting pain medication for pt.  Will make PA aware.

## 2015-10-09 NOTE — ED Notes (Signed)
Ice applied to elbow

## 2015-10-09 NOTE — ED Notes (Signed)
Pt. missed her step and fell forward this evening at home , denies LOC / ambulatory , reports pain / mild swelling at left elbow worse with movement .

## 2015-10-09 NOTE — ED Provider Notes (Signed)
CSN: [id number]     Arrival date & time 10/09/15  2237 History  By signing my name below, I, Nicole Kindred, attest that this documentation has been prepared under the direction and in the presence of Etta Quill, NP.   Electronically Signed: Nicole Kindred, ED Scribe 10/09/2015 at 11:50 PM.   Chief Complaint  Patient presents with  . Elbow Injury   The history is provided by the patient. No language interpreter was used.   HPI Comments: Dorothy Boyd is a 60 y.o. female who presents to the Emergency Department complaining of sudden onset, left elbow pain s/p fall in which she slipped from one step and hit her arm on the running board of a truck. No LOC or head trauma noted in the incident. Pt reports associated left elbow swelling, left arm pain, and left arm ecchymosis. No worsening or alleviating factors noted. Pt denies numbness, tingling, weakness, chest pain, abdominal pain, or any other pertinent symptoms.   Past Medical History  Diagnosis Date  . Hyperlipidemia   . Depression   . Anxiety   . Vitamin D deficiency   . Colon cancer ([place])   . Diabetes ([place])   . Cervical dysplasia    Past Surgical History  Procedure Laterality Date  . Appendectomy     Family History  Problem Relation Age of Onset  . Cancer Mother     ovarian  . Hyperlipidemia Mother   . Hypertension Mother   . Diabetes Sister   . Hyperlipidemia Sister   . Heart disease Brother    Social History  Substance Use Topics  . Smoking status: Current Every Day Smoker  . Smokeless tobacco: None  . Alcohol Use: No   OB History    No data available     Review of Systems  Cardiovascular: Negative for chest pain.  Gastrointestinal: Negative for abdominal pain.  Musculoskeletal: Positive for joint swelling and arthralgias.  Neurological: Negative for weakness and numbness.  All other systems reviewed and are negative.    Allergies  Codeine; Lunesta; and Welchol  Home Medications   Prior to  Admission medications   Medication Sig Start Date End Date Taking? Authorizing Provider  Alogliptin Benzoate (NESINA) 25 MG TABS 1 pill daily for diabetes 06/11/15   [name], PA-C  aspirin 81 MG tablet Take 81 mg by mouth every other day.    Historical Provider, MD  citalopram (CELEXA) 40 MG tablet Take by mouth.    Historical Provider, MD  glipiZIDE (GLUCOTROL) 5 MG tablet Take 1 tablet (5 mg total) by mouth daily before breakfast. 06/12/15 06/11/16  [name], PA-C  metFORMIN (GLUCOPHAGE XR) 500 MG 24 hr tablet Take 1 to 2 tablets 2 x day with food for Diabetes 04/29/15 10/27/15  Unk Pinto, MD  promethazine (PHENERGAN) 25 MG tablet Take 1 tablet (25 mg total) by mouth every 6 (six) hours as needed for nausea or vomiting. 03/19/15   [name], PA-C   BP 137/71 mmHg  Pulse 91  Temp(Src) 97.7 F (36.5 C) (Oral)  Resp 18  Ht 5\' 3"  (1.6 m)  Wt 163 lb (73.936 kg)  BMI 28.88 kg/m2  SpO2 96% Physical Exam  Constitutional: She appears well-developed and well-nourished. No distress.  HENT:  Head: Normocephalic and atraumatic.  Eyes: Conjunctivae and EOM are normal.  Neck: Neck supple. No tracheal deviation present.  Cardiovascular: Normal rate.   Pulmonary/Chest: Effort normal. No respiratory distress.  Musculoskeletal: Normal range of motion. She exhibits tenderness.  TTP  to left elbow and left humerus. Limited ROM due to pain. Good grip strength. Good capillary refill.   Neurological: She is alert.  Skin: Skin is warm and dry.  Psychiatric: She has a normal mood and affect. Her behavior is normal.    ED Course  Procedures (including critical care time) DIAGNOSTIC STUDIES: Oxygen Saturation is 96% on RA, adequate by my interpretation.    COORDINATION OF CARE: 11:50 PM Discussed treatment plan which includes DG elbow complete left, DG humerus left, and percocet/roxicet with pt at bedside and pt agreed to plan.  Labs Review Labs Reviewed - No data to  display  Imaging Review Dg Elbow Complete Left  10/09/2015  CLINICAL DATA:  [place] [address] off porch stairs, landing on her left elbow. EXAM: LEFT ELBOW - COMPLETE 3+ VIEW COMPARISON:  None. FINDINGS: There is no evidence of fracture, dislocation, or joint effusion. There is no evidence of arthropathy or other focal bone abnormality. Soft tissues are unremarkable. IMPRESSION: Negative. Electronically Signed   By: Andreas Newport M.D.   On: 10/09/2015 23:21   I have personally reviewed and evaluated these images as part of my medical decision-making.   EKG Interpretation None      MDM   Final diagnoses:  None  Left elbow contusion. Left upper arm contusion.  Patient X-Ray negative for obvious fracture or dislocation.  Pt advised to follow up with orthopedics. Patient given sling while in ED, conservative therapy recommended and discussed. Patient will be discharged home & is agreeable with above plan. Returns precautions discussed. Pt appears safe for discharge.  I personally performed the services described in this documentation, which was scribed in my presence. The recorded information has been reviewed and is accurate.    Etta Quill, NP 10/10/15 [id number]  Sherwood Gambler, MD 10/10/15 1504

## 2015-10-10 ENCOUNTER — Emergency Department (HOSPITAL_COMMUNITY): Payer: BLUE CROSS/BLUE SHIELD

## 2015-10-10 MED ORDER — PROMETHAZINE HCL 25 MG PO TABS
12.5000 mg | ORAL_TABLET | Freq: Once | ORAL | Status: AC
  Administered 2015-10-10: 12.5 mg via ORAL
  Filled 2015-10-10: qty 1

## 2015-10-10 MED ORDER — OXYCODONE-ACETAMINOPHEN 5-325 MG PO TABS: 1.0000 | ORAL_TABLET | Freq: Three times a day (TID) | ORAL | Status: DC | PRN

## 2015-10-10 NOTE — Discharge Instructions (Signed)
Elbow Contusion °An elbow contusion is a deep bruise of the elbow. Contusions are the result of an injury that caused bleeding under the skin. The contusion may turn blue, purple, or yellow. Minor injuries will give you a painless contusion, but more severe contusions may stay painful and swollen for a few weeks.  °CAUSES  °An elbow contusion comes from a direct force to that area, such as falling on the elbow. °SYMPTOMS  °· Swelling and redness of the elbow. °· Bruising of the elbow area. °· Tenderness or soreness of the elbow. °DIAGNOSIS  °You will have a physical exam and will be asked about your history. You may need an X-ray of your elbow to look for a broken bone (fracture).  °TREATMENT  °A sling or splint may be needed to support your injury. Resting, elevating, and applying cold compresses to the elbow area are often the best treatments for an elbow contusion. Over-the-counter medicines may also be recommended for pain control. °HOME CARE INSTRUCTIONS  °· Put ice on the injured area. °¨ Put ice in a plastic bag. °¨ Place a towel between your skin and the bag. °¨ Leave the ice on for 15-20 minutes, 03-04 times a day. °· Only take over-the-counter or prescription medicines for pain, discomfort, or fever as directed by your caregiver. °· Rest your injured elbow until the pain and swelling are better. °· Elevate your elbow to reduce swelling. °· Apply a compression wrap as directed by your caregiver. This can help reduce swelling and motion. You may remove the wrap for sleeping, showers, and baths. If your fingers become numb, cold, or blue, take the wrap off and reapply it more loosely. °· Use your elbow only as directed by your caregiver. You may be asked to do range of motion exercises. Do them as directed. °· See your caregiver as directed. It is very important to keep all follow-up appointments in order to avoid any long-term problems with your elbow, including chronic pain or inability to move your elbow  normally. °SEEK IMMEDIATE MEDICAL CARE IF:  °· You have increased redness, swelling, or pain in your elbow. °· Your swelling or pain is not relieved with medicines. °· You have swelling of the hand and fingers. °· You are unable to move your fingers or wrist. °· You begin to lose feeling in your hand or fingers. °· Your fingers or hand become cold or blue. °MAKE SURE YOU:  °· Understand these instructions. °· Will watch your condition. °· Will get help right away if you are not doing well or get worse. °  °This information is not intended to replace advice given to you by your health care provider. Make sure you discuss any questions you have with your health care provider. °  °Document Released: 04/25/2006 Document Revised: 08/09/2011 Document Reviewed: 12/30/2014 °Elsevier Interactive Patient Education ©2016 [employer] ° °

## 2015-10-10 NOTE — ED Notes (Signed)
Patient able to ambulate independently.  E-signature pad in room not working.  Pt verbalized understanding of discharge instructions and prescriptions.  Pt also verbalized understanding of follow-up care.

## 2016-04-26 ENCOUNTER — Other Ambulatory Visit: Payer: Self-pay | Admitting: Physician Assistant

## 2016-04-26 DIAGNOSIS — Z1231 Encounter for screening mammogram for malignant neoplasm of breast: Secondary | ICD-10-CM

## 2016-04-27 ENCOUNTER — Ambulatory Visit (HOSPITAL_BASED_OUTPATIENT_CLINIC_OR_DEPARTMENT_OTHER)
Admission: RE | Admit: 2016-04-27 | Discharge: 2016-04-27 | Disposition: A | Discharge status: Home / Self Care | Payer: BLUE CROSS/BLUE SHIELD | Source: Ambulatory Visit | Attending: Physician Assistant | Admitting: Physician Assistant

## 2016-04-27 DIAGNOSIS — Z1231 Encounter for screening mammogram for malignant neoplasm of breast: Secondary | ICD-10-CM | POA: Insufficient documentation

## 2016-05-02 ENCOUNTER — Encounter: Payer: Self-pay | Admitting: Physician Assistant

## 2016-05-04 ENCOUNTER — Other Ambulatory Visit: Payer: Self-pay | Admitting: Physician Assistant

## 2016-05-04 DIAGNOSIS — R928 Other abnormal and inconclusive findings on diagnostic imaging of breast: Secondary | ICD-10-CM

## 2016-06-04 ENCOUNTER — Other Ambulatory Visit: Payer: Self-pay | Admitting: Physician Assistant

## 2016-06-04 ENCOUNTER — Ambulatory Visit
Admission: RE | Admit: 2016-06-04 | Discharge: 2016-06-04 | Disposition: A | Discharge status: Home / Self Care | Payer: Self-pay | Source: Ambulatory Visit | Attending: Physician Assistant | Admitting: Physician Assistant

## 2016-06-04 DIAGNOSIS — R928 Other abnormal and inconclusive findings on diagnostic imaging of breast: Secondary | ICD-10-CM

## 2016-06-04 DIAGNOSIS — N6320 Unspecified lump in the left breast, unspecified quadrant: Secondary | ICD-10-CM

## 2016-06-10 ENCOUNTER — Other Ambulatory Visit: Payer: Self-pay | Admitting: Physician Assistant

## 2016-06-10 ENCOUNTER — Ambulatory Visit
Admission: RE | Admit: 2016-06-10 | Discharge: 2016-06-10 | Disposition: A | Discharge status: Home / Self Care | Payer: No Typology Code available for payment source | Source: Ambulatory Visit | Attending: Physician Assistant | Admitting: Physician Assistant

## 2016-06-10 DIAGNOSIS — N6320 Unspecified lump in the left breast, unspecified quadrant: Secondary | ICD-10-CM

## 2016-06-10 HISTORY — PX: BREAST BIOPSY: SHX20

## 2016-08-04 ENCOUNTER — Ambulatory Visit: Payer: Self-pay | Admitting: Physician Assistant

## 2016-08-04 NOTE — Progress Notes (Deleted)
Assessment and Plan:  1. Hypertension -Continue medication, monitor blood pressure at home. Continue DASH diet.  Reminder to go to the ER if any CP, SOB, nausea, dizziness, severe HA, changes vision/speech, left arm numbness and tingling and jaw pain.  2. Cholesterol -Continue diet and exercise. Check cholesterol.   3. Diabetes without complications -Continue diet and exercise. Check A1C - stop MF due to nausea/diarrhea- get on Nesina and glipizide - close follow up 1 month  4. Vitamin D Def - check level and continue medications.   5. Depression Continue celexa   Continue diet and meds as discussed. Further disposition pending results of labs. Discussed med's effects and SE's.   Over 30 minutes of exam, counseling, chart review, and critical decision making was performed   HPI 61 y.o. female  presents follow up for DM, chol, HTN.   Her blood pressure has been controlled at home, today her BP is  .  She does not workout. She denies chest pain, shortness of breath, dizziness.  She is not on cholesterol medication and denies myalgias. Her cholesterol is not at goal. The cholesterol was:   Lab Results  Component Value Date   CHOL 220 (H) 06/11/2015   HDL 26 (L) 06/11/2015   LDLCALC NOT CALC 06/11/2015   TRIG 499 (H) 06/11/2015   CHOLHDL 8.5 (H) 06/11/2015    She has been working on diet and exercise for diabetes without complications, she is not on bASA due to easy bruising/bleeding, she is not on ACE/ARB, and denies  paresthesia of the feet, polydipsia, polyuria and visual disturbances. Last A1C was:  Lab Results  Component Value Date   HGBA1C 9.0 (H) 09/18/2015   Last GFR: Lab Results  Component Value Date   GFRNONAA 84 09/18/2015    Patient is on Vitamin D supplement. Lab Results  Component Value Date   VD25OH 10 (L) 08/27/2014   She is on celexa for depression/anxiety.   Current Medications:  Current Outpatient Prescriptions on File Prior to Visit  Medication  Sig Dispense Refill  . Alogliptin Benzoate (NESINA) 25 MG TABS 1 pill daily for diabetes 30 tablet 5  . aspirin 81 MG tablet Take 81 mg by mouth every other day.    . citalopram (CELEXA) 40 MG tablet Take by mouth.    Marland Kitchen glipiZIDE (GLUCOTROL) 5 MG tablet Take 1 tablet (5 mg total) by mouth daily before breakfast. 30 tablet 4  . oxyCODONE-acetaminophen (PERCOCET/ROXICET) 5-325 MG tablet Take 1 tablet by mouth every 8 (eight) hours as needed for severe pain. 10 tablet 0  . promethazine (PHENERGAN) 25 MG tablet Take 1 tablet (25 mg total) by mouth every 6 (six) hours as needed for nausea or vomiting. 30 tablet 3   No current facility-administered medications on file prior to visit.    Medical History:  Past Medical History:  Diagnosis Date  . Anxiety   . Cervical dysplasia   . Colon cancer ([place])   . Depression   . Diabetes ([place])   . Hyperlipidemia   . Vitamin D deficiency    Allergies:  Allergies  Allergen Reactions  . Codeine   . Lunesta [Eszopiclone]     Excessive fatigue  . Welchol [Colesevelam Hcl]     nausea      Review of Systems:  Review of Systems  Constitutional: Negative for chills, diaphoresis, fever, malaise/fatigue and weight loss.  HENT: Negative.   Respiratory: Negative.   Cardiovascular: Negative.   Gastrointestinal: Positive for diarrhea and nausea. Negative  for abdominal pain, blood in stool, constipation, heartburn, melena and vomiting.  Genitourinary: Negative.   Musculoskeletal: Negative.   Skin: Negative.   Neurological: Negative for weakness.  Psychiatric/Behavioral: Negative.     Family history- Review and unchanged Social history- Review and unchanged Physical Exam: There were no vitals taken for this visit. Wt Readings from Last 3 Encounters:  10/09/15 163 lb (73.9 kg)  09/18/15 165 lb 6.4 oz (75 kg)  07/24/15 166 lb 3.2 oz (75.4 kg)   General Appearance: Well nourished, in no apparent distress. Eyes: PERRLA, EOMs, conjunctiva no swelling  or erythema Sinuses: No Frontal/maxillary tenderness ENT/Mouth: Ext aud canals clear, TMs without erythema, bulging. No erythema, swelling, or exudate on post pharynx.  Tonsils not swollen or erythematous. Hearing normal.  Neck: Supple, thyroid normal.  Respiratory: Respiratory effort normal, BS equal bilaterally without rales, rhonchi, wheezing or stridor.  Cardio: RRR with no MRGs. Brisk peripheral pulses without edema.  Abdomen: Soft, decreased BS, nontender,without hernias, masses. Lymphatics: Non tender without lymphadenopathy.  Musculoskeletal: Full ROM, 5/5 strength, Normal gait Warm, dry without rashes, ecchymosis.  Neuro: Cranial nerves intact. No cerebellar symptoms.  Psych: Awake and oriented X 3, normal affect, Insight and Judgment appropriate.    [name], PA-C 9:53 AM [facility]

## 2016-08-09 ENCOUNTER — Ambulatory Visit: Payer: Self-pay | Admitting: Physician Assistant

## 2016-08-11 ENCOUNTER — Ambulatory Visit (INDEPENDENT_AMBULATORY_CARE_PROVIDER_SITE_OTHER): Payer: Self-pay | Admitting: Internal Medicine

## 2016-08-11 ENCOUNTER — Ambulatory Visit: Payer: Self-pay | Admitting: Physician Assistant

## 2016-08-11 ENCOUNTER — Encounter: Payer: Self-pay | Admitting: Internal Medicine

## 2016-08-11 VITALS — BP 120/68 | HR 100 | Temp 98.0°F | Resp 16 | Ht 63.0 in | Wt 160.0 lb

## 2016-08-11 DIAGNOSIS — E782 Mixed hyperlipidemia: Secondary | ICD-10-CM

## 2016-08-11 DIAGNOSIS — E1165 Type 2 diabetes mellitus with hyperglycemia: Secondary | ICD-10-CM

## 2016-08-11 DIAGNOSIS — Z79899 Other long term (current) drug therapy: Secondary | ICD-10-CM

## 2016-08-11 DIAGNOSIS — Z85038 Personal history of other malignant neoplasm of large intestine: Secondary | ICD-10-CM

## 2016-08-11 DIAGNOSIS — I10 Essential (primary) hypertension: Secondary | ICD-10-CM

## 2016-08-11 DIAGNOSIS — F322 Major depressive disorder, single episode, severe without psychotic features: Secondary | ICD-10-CM

## 2016-08-11 DIAGNOSIS — D249 Benign neoplasm of unspecified breast: Secondary | ICD-10-CM

## 2016-08-11 DIAGNOSIS — R197 Diarrhea, unspecified: Secondary | ICD-10-CM

## 2016-08-11 LAB — CBC WITH DIFFERENTIAL/PLATELET
BASOS ABS: 166 {cells}/uL (ref 0–200)
BASOS PCT: 1 %
EOS ABS: 166 {cells}/uL (ref 15–500)
Eosinophils Relative: 1 %
HCT: 40.1 % (ref 35.0–45.0)
Hemoglobin: 13 g/dL (ref 11.7–15.5)
LYMPHS PCT: 32 %
Lymphs Abs: 5312 cells/uL — ABNORMAL HIGH (ref 850–3900)
MCH: 19.7 pg — ABNORMAL LOW (ref 27.0–33.0)
MCHC: 32.4 g/dL (ref 32.0–36.0)
MCV: 60.7 fL — AB (ref 80.0–100.0)
MONOS PCT: 4 %
MPV: 9.6 fL (ref 7.5–12.5)
Monocytes Absolute: 664 cells/uL (ref 200–950)
Neutro Abs: 10292 cells/uL — ABNORMAL HIGH (ref 1500–7800)
Neutrophils Relative %: 62 %
PLATELETS: 442 10*3/uL — AB (ref 140–400)
RBC: 6.61 MIL/uL — AB (ref 3.80–5.10)
RDW: 16.5 % — ABNORMAL HIGH (ref 11.0–15.0)
WBC: 16.6 10*3/uL — ABNORMAL HIGH (ref 3.8–10.8)

## 2016-08-11 LAB — COMPREHENSIVE METABOLIC PANEL
ALBUMIN: 4.1 g/dL (ref 3.6–5.1)
ALT: 13 U/L (ref 6–29)
AST: 14 U/L (ref 10–35)
Alkaline Phosphatase: 95 U/L (ref 33–130)
BUN: 12 mg/dL (ref 7–25)
CHLORIDE: 98 mmol/L (ref 98–110)
CO2: 25 mmol/L (ref 20–31)
CREATININE: 0.74 mg/dL (ref 0.50–0.99)
Calcium: 9.5 mg/dL (ref 8.6–10.4)
GLUCOSE: 335 mg/dL — AB (ref 65–99)
Potassium: 4.4 mmol/L (ref 3.5–5.3)
SODIUM: 133 mmol/L — AB (ref 135–146)
Total Bilirubin: 0.4 mg/dL (ref 0.2–1.2)
Total Protein: 7.2 g/dL (ref 6.1–8.1)

## 2016-08-11 NOTE — Progress Notes (Signed)
Assessment and Plan:  Hypertension:  -well controlled currently, not on medication  -monitor blood pressure at home. -Continue DASH diet -Reminder to go to the ER if any CP, SOB, nausea, dizziness, severe HA, changes vision/speech, left arm numbness and tingling and jaw pain.  Cholesterol -not currently on medication and not at goal - Continue diet and exercise   Diabetes with diabetic chronic kidney disease  -ideally need to taper up to 4 metformin per day -increase glipizide to full tablet with each meal and not taking if CBGs less than 150 -encouraged to start checking blood sugars again -Continue diet and exercise.  -Check A1C  Vitamin D Def -continue medications.   Diarrhea with history of colon cancer -very concerning given history of colon cancer -I recommended we send her back to see Dr. Earlean Shawl as she really needs to have another colonoscopy as we are 5 years out from last one per her report.  It is possible that this is overflow diarrhea caused by chronic constipation and has improved with the use of colace.  She wants to continue the colace for another week and if still having diarrhea will see Dr. Earlean Shawl -she is also on metformin which could be a source of diarrhea -given self pay status we are limited with amount of testing we can order  Papilloma of the breast -recommended that the patient get in to see [facility] general surgery for further workup of breast mass which patient states that she cannot afford currently due to cost of breast biopsy.  I have recommended that she apply to [facility] and also [facility] to see if she can get some aid financially for her medical care.    Severe depression -gave the patient several samples of seroquel 25 mg to act as mood stabilizer in addition to celexa as she is on max dose of celexa -will send in to [facility] and patient will try using goodrx.  Coupon for it.   -patient to go to the ER if  worsening depression or thoughts of harming herself or anyone else.    Continue diet and meds as discussed. Further disposition pending results of labs. Discussed med's effects and SE's.    HPI 61 y.o. female  presents for 3 month follow up with hypertension, hyperlipidemia, diabetes and vitamin D deficiency.   Her blood pressure has been controlled at home, today their BP is BP: 120/68.She does not workout. She denies chest pain, shortness of breath, dizziness.   She is not on cholesterol medication and denies myalgias. Her cholesterol is not at goal. The cholesterol was:  No results found for requested labs within last 8760 hours.   She has not been working on diet and exercise for diabetes with diabetic chronic kidney disease, she is not on bASA, she is not on ACE/ARB, and denies  foot ulcerations, hyperglycemia, hypoglycemia , increased appetite, nausea, paresthesia of the feet, polydipsia, polyuria, visual disturbances, vomiting and weight loss. Last A1C was: 09/18/2015: Hgb A1c MFr Bld 9.0.  She does not take her blood sugars at home.  She has a history of non-compliance.  She can only take 1/2 tablet of metformin due to stomach upset.     Patient is on Vitamin D supplement. No results found for requested labs within last 8760 hours.  She reports that she fell down the stairs.  She reports that she hit her arm and thought she broke it.  She reports that her arm is  back to normal now.  It is no longer bothering her.  She is having some diarrhea and some cramping in her abdomen.  She reports that she is getting episodes of spastic diarrhea and she can have the pain really quickly.  She is not having blood in her stools or black colored stools.  She reports that stools are not greasy and do not look pencils.  She reports that he has a history of colon cancer.  She does not think it is affected by food choice.  She is not affected by stress levels that she can tell.  No problems with urinating.   She is generally only having 1 episode.  She does take stool softeners.  She reports that she is taking colace.  She is taking the colace every day.  She has tried phillips, dulcolax, and miralax.  She reports that she vomited up the miralax.  She reports that she cannot tolerate it.  She does not drink a lot of water.  Last colonoscopy was less than 5 years ago.    She reports that she was due to see general surgery for a papilloma of the left breast.  Clip was placed.  She has not seen surgeon yet secondary to concerns with finances.   She reports that she has been having a lot of bad thoughts. She reports that she has attempted suicide in the past.  She has no real plan to harm herself.  She does not have access to a firearm.  She is taking 40 mg of celexa daily.  A lot of her worsening moods are related to her financial and health difficulties.    Current Medications:  Current Outpatient Prescriptions on File Prior to Visit  Medication Sig Dispense Refill  . citalopram (CELEXA) 40 MG tablet Take by mouth.    Marland Kitchen glipiZIDE (GLUCOTROL) 5 MG tablet Take 1 tablet (5 mg total) by mouth daily before breakfast. 30 tablet 4   No current facility-administered medications on file prior to visit.    Medical History:  Past Medical History:  Diagnosis Date  . Anxiety   . Cervical dysplasia   . Colon cancer ([place])   . Depression   . Diabetes ([place])   . Hyperlipidemia   . Vitamin D deficiency    Allergies:  Allergies  Allergen Reactions  . Codeine   . Lunesta [Eszopiclone]     Excessive fatigue  . Welchol [Colesevelam Hcl]     nausea      Review of Systems:  Review of Systems  Constitutional: Positive for malaise/fatigue. Negative for chills, diaphoresis, fever and weight loss.  HENT: Negative for congestion, hearing loss, nosebleeds and sore throat.   Respiratory: Negative for cough, shortness of breath, wheezing and stridor.   Cardiovascular: Negative for chest pain, palpitations and leg  swelling.  Gastrointestinal: Positive for constipation and diarrhea. Negative for abdominal pain, blood in stool, heartburn, melena, nausea and vomiting.  Genitourinary: Negative.   Musculoskeletal: Negative for back pain, falls, joint pain, myalgias and neck pain.  Neurological: Negative for dizziness, sensory change, loss of consciousness, weakness and headaches.  Psychiatric/Behavioral: Positive for depression. Negative for hallucinations, memory loss and suicidal ideas. The patient is nervous/anxious.     Family history- Review and unchanged  Social history- Review and unchanged  Physical Exam: BP 120/68   Pulse 100   Temp 98 F (36.7 C) (Temporal)   Resp 16   Ht 5\' 3"  (1.6 m)   Wt 160 lb (72.6 kg)  BMI 28.34 kg/m  Wt Readings from Last 3 Encounters:  08/11/16 160 lb (72.6 kg)  10/09/15 163 lb (73.9 kg)  09/18/15 165 lb 6.4 oz (75 kg)   General Appearance: Chronically ill appearing WF who appears older than stated age, Well nourished well developed, non-toxic appearing, in no apparent distress. Eyes: PERRLA, EOMs, conjunctiva no swelling or erythema ENT/Mouth: Ear canals clear with no erythema, swelling, or discharge.  TMs normal bilaterally, oropharynx clear, moist, with no exudate.   Neck: Supple, thyroid normal, no JVD, no cervical adenopathy.  Respiratory: Respiratory effort normal, breath sounds clear A&P, no wheeze, rhonchi or rales noted.  No retractions, no accessory muscle usage Cardio: RRR with no MRGs. No noted edema.  Abdomen: Soft, + BS.  Well healed surgical scar to the abdomen.   Non tender, no guarding, rebound, hernias, masses. Musculoskeletal: Full ROM, 5/5 strength, Normal gait Skin: Warm, dry without rashes, lesions, ecchymosis.  Neuro: Awake and oriented X 3, Cranial nerves intact. No cerebellar symptoms.  Psych: normal affect, Insight and Judgment appropriate.    Starlyn Skeans, PA-C 4:34 PM [facility]

## 2016-08-12 ENCOUNTER — Encounter: Payer: Self-pay | Admitting: Internal Medicine

## 2016-08-12 LAB — HEMOGLOBIN A1C
Hgb A1c MFr Bld: 11.4 % — ABNORMAL HIGH (ref <5.7)
MEAN PLASMA GLUCOSE: 280 mg/dL

## 2016-08-13 ENCOUNTER — Other Ambulatory Visit: Payer: Self-pay | Admitting: Internal Medicine

## 2016-08-13 MED ORDER — PROMETHAZINE HCL 25 MG PO TABS: 25.0000 mg | ORAL_TABLET | Freq: Four times a day (QID) | ORAL | 3 refills | Status: DC | PRN

## 2016-08-13 MED ORDER — GLIPIZIDE 5 MG PO TABS: ORAL_TABLET | ORAL | 2 refills | Status: DC

## 2016-08-13 MED ORDER — CITALOPRAM HYDROBROMIDE 40 MG PO TABS: 40.0000 mg | ORAL_TABLET | Freq: Every day | ORAL | 3 refills | Status: DC

## 2016-08-15 MED ORDER — QUETIAPINE FUMARATE 25 MG PO TABS: 25.0000 mg | ORAL_TABLET | Freq: Every day | ORAL | 0 refills | Status: DC

## 2016-08-16 ENCOUNTER — Encounter: Payer: Self-pay | Admitting: Internal Medicine

## 2016-08-16 ENCOUNTER — Other Ambulatory Visit: Payer: Self-pay | Admitting: Internal Medicine

## 2016-10-13 ENCOUNTER — Encounter: Payer: Self-pay | Admitting: *Deleted

## 2016-11-16 ENCOUNTER — Other Ambulatory Visit: Payer: Self-pay | Admitting: Physician Assistant

## 2016-11-16 ENCOUNTER — Encounter: Payer: Self-pay | Admitting: Physician Assistant

## 2016-11-16 MED ORDER — QUETIAPINE FUMARATE 25 MG PO TABS: 25.0000 mg | ORAL_TABLET | Freq: Every day | ORAL | 0 refills | Status: DC

## 2016-11-18 ENCOUNTER — Other Ambulatory Visit: Payer: Self-pay | Admitting: *Deleted

## 2016-11-18 MED ORDER — QUETIAPINE FUMARATE 25 MG PO TABS: 25.0000 mg | ORAL_TABLET | Freq: Every day | ORAL | 0 refills | Status: DC

## 2016-11-22 ENCOUNTER — Other Ambulatory Visit: Payer: Self-pay

## 2016-11-22 MED ORDER — QUETIAPINE FUMARATE 25 MG PO TABS: 25.0000 mg | ORAL_TABLET | Freq: Every day | ORAL | 0 refills | Status: DC

## 2016-12-03 ENCOUNTER — Encounter (HOSPITAL_BASED_OUTPATIENT_CLINIC_OR_DEPARTMENT_OTHER): Payer: Self-pay | Admitting: Emergency Medicine

## 2016-12-03 ENCOUNTER — Emergency Department (HOSPITAL_BASED_OUTPATIENT_CLINIC_OR_DEPARTMENT_OTHER)
Admission: EM | Admit: 2016-12-03 | Discharge: 2016-12-04 | Disposition: A | Discharge status: Home / Self Care | Payer: Self-pay | Attending: Emergency Medicine | Admitting: Emergency Medicine

## 2016-12-03 ENCOUNTER — Emergency Department (HOSPITAL_BASED_OUTPATIENT_CLINIC_OR_DEPARTMENT_OTHER): Payer: Self-pay

## 2016-12-03 DIAGNOSIS — E119 Type 2 diabetes mellitus without complications: Secondary | ICD-10-CM | POA: Insufficient documentation

## 2016-12-03 DIAGNOSIS — Z7984 Long term (current) use of oral hypoglycemic drugs: Secondary | ICD-10-CM | POA: Insufficient documentation

## 2016-12-03 DIAGNOSIS — Z79899 Other long term (current) drug therapy: Secondary | ICD-10-CM | POA: Insufficient documentation

## 2016-12-03 DIAGNOSIS — D72828 Other elevated white blood cell count: Secondary | ICD-10-CM | POA: Insufficient documentation

## 2016-12-03 DIAGNOSIS — K529 Noninfective gastroenteritis and colitis, unspecified: Secondary | ICD-10-CM | POA: Insufficient documentation

## 2016-12-03 DIAGNOSIS — D649 Anemia, unspecified: Secondary | ICD-10-CM | POA: Insufficient documentation

## 2016-12-03 DIAGNOSIS — Z85038 Personal history of other malignant neoplasm of large intestine: Secondary | ICD-10-CM | POA: Insufficient documentation

## 2016-12-03 DIAGNOSIS — F17200 Nicotine dependence, unspecified, uncomplicated: Secondary | ICD-10-CM | POA: Insufficient documentation

## 2016-12-03 LAB — CBC
HEMATOCRIT: 35.3 % — AB (ref 36.0–46.0)
HEMOGLOBIN: 11.6 g/dL — AB (ref 12.0–15.0)
MCH: 19.7 pg — ABNORMAL LOW (ref 26.0–34.0)
MCHC: 32.9 g/dL (ref 30.0–36.0)
MCV: 59.9 fL — AB (ref 78.0–100.0)
Platelets: 385 10*3/uL (ref 150–400)
RBC: 5.89 MIL/uL — ABNORMAL HIGH (ref 3.87–5.11)
RDW: 18.5 % — AB (ref 11.5–15.5)
WBC: 15.3 10*3/uL — AB (ref 4.0–10.5)

## 2016-12-03 LAB — URINALYSIS, ROUTINE W REFLEX MICROSCOPIC
Bilirubin Urine: NEGATIVE
Hgb urine dipstick: NEGATIVE
KETONES UR: NEGATIVE mg/dL
LEUKOCYTES UA: NEGATIVE
Nitrite: NEGATIVE
PH: 6.5 (ref 5.0–8.0)
Protein, ur: NEGATIVE mg/dL
SPECIFIC GRAVITY, URINE: 1.02 (ref 1.005–1.030)

## 2016-12-03 LAB — URINALYSIS, MICROSCOPIC (REFLEX): RBC / HPF: NONE SEEN RBC/hpf (ref 0–5)

## 2016-12-03 MED ORDER — MORPHINE SULFATE (PF) 4 MG/ML IV SOLN
6.0000 mg | Freq: Once | INTRAVENOUS | Status: AC
  Administered 2016-12-03: 6 mg via INTRAVENOUS
  Filled 2016-12-03: qty 2

## 2016-12-03 MED ORDER — SODIUM CHLORIDE 0.9 % IV BOLUS (SEPSIS)
1000.0000 mL | Freq: Once | INTRAVENOUS | Status: AC
  Administered 2016-12-03: 1000 mL via INTRAVENOUS

## 2016-12-03 MED ORDER — ONDANSETRON HCL 4 MG/2ML IJ SOLN
4.0000 mg | Freq: Once | INTRAMUSCULAR | Status: AC
  Administered 2016-12-03: 4 mg via INTRAVENOUS
  Filled 2016-12-03: qty 2

## 2016-12-03 NOTE — ED Notes (Signed)
Pt c/o generalized abdominal pain for the last three days with nausea and diarrhea, denies fevers, denies any recent abx usage, denies food born source,

## 2016-12-03 NOTE — ED Provider Notes (Signed)
[place] DEPT [facility] Provider Note   CSN: [id number] Arrival date & time: 12/03/16  2236     History   Chief Complaint Chief Complaint  Patient presents with  . Abdominal Pain    HPI Dorothy Boyd is a 61 y.o. female.  HPI   61 yo F with PMhx colon CA, HLD, here with lower abdominal pain. Pt states that his sx started 2 weeks ago with moderate bilateral LQ abdominal pain with nausea. She went to [facility] and had a WBC of 19k and was told to come to the ED but she felt better 4 days later. Over the last 2 days, pt has developed recurrence of gradual onset aching, throbbing lower quadrant abdominal pain. Pain is aching, gnawing, worse with movement and palpation. Pt has also developed associated nausea, but no vomiting, as well as profuse watery diarrhea. No fever or chills. No dysuria or frequency. No vaginal bleeding or discharge.   Past Medical History:  Diagnosis Date  . Anxiety   . Cervical dysplasia   . Colon cancer ([place])   . Depression   . Diabetes ([place])   . Hyperlipidemia   . Vitamin D deficiency     Patient Active Problem List   Diagnosis Date Noted  . Hyperlipidemia   . Diabetes ([place])   . Colon cancer ([place])   . Vitamin D deficiency   . Anxiety   . Depression   . Leukocytosis, unspecified 09/18/2012  . History of colon cancer, stage II 09/18/2012    Past Surgical History:  Procedure Laterality Date  . APPENDECTOMY      OB History    No data available       Home Medications    Prior to Admission medications   Medication Sig Start Date End Date Taking? Authorizing Provider  ciprofloxacin (CIPRO) 500 MG tablet Take 1 tablet (500 mg total) by mouth every 12 (twelve) hours. 12/04/16 12/14/16  Duffy Bruce, MD  citalopram (CELEXA) 40 MG tablet Take 1 tablet (40 mg total) by mouth daily. 08/13/16   Forcucci, Courtney, PA-C  docusate sodium (COLACE) 250 MG capsule Take 1 capsule (250 mg total) by mouth daily. While taking pain medications 12/04/16   Duffy Bruce, MD  glipiZIDE (GLUCOTROL) 5 MG tablet Take to 3 times daily with meals. Do not take if your blood sugars if they are less than 150. 08/13/16   Forcucci, Loma Sousa, PA-C  metFORMIN (GLUMETZA) 500 MG (MOD) 24 hr tablet Take 500 mg by mouth daily with breakfast.    [provider]  metroNIDAZOLE (FLAGYL) 500 MG tablet Take 1 tablet (500 mg total) by mouth 3 (three) times daily. 12/04/16 12/14/16  Duffy Bruce, MD  ondansetron (ZOFRAN ODT) 4 MG disintegrating tablet Take 1 tablet (4 mg total) by mouth every 8 (eight) hours as needed for nausea or vomiting. 12/04/16   Duffy Bruce, MD  oxyCODONE-acetaminophen (PERCOCET/ROXICET) 5-325 MG tablet Take 1-2 tablets by mouth every 4 (four) hours as needed for moderate pain or severe pain. 12/04/16   Duffy Bruce, MD  promethazine (PHENERGAN) 25 MG tablet Take 1 tablet (25 mg total) by mouth every 6 (six) hours as needed for nausea or vomiting. 08/13/16   Forcucci, Courtney, PA-C  QUEtiapine (SEROQUEL) 25 MG tablet Take 1 tablet (25 mg total) by mouth at bedtime. 11/22/16   [name], PA-C    Family History Family History  Problem Relation Age of Onset  . Cancer Mother        ovarian  .  Hyperlipidemia Mother   . Hypertension Mother   . Diabetes Sister   . Hyperlipidemia Sister   . Heart disease Brother     Social History Social History  Substance Use Topics  . Smoking status: Current Every Day Smoker  . Smokeless tobacco: Never Used  . Alcohol use No     Allergies   Codeine; Lunesta [eszopiclone]; and Welchol [colesevelam hcl]   Review of Systems Review of Systems  Constitutional: Positive for fatigue. Negative for chills and fever.  HENT: Negative for congestion and rhinorrhea.   Eyes: Negative for visual disturbance.  Respiratory: Negative for cough, shortness of breath and wheezing.   Cardiovascular: Negative for chest pain and leg swelling.  Gastrointestinal: Positive for abdominal pain, diarrhea and nausea.  Negative for vomiting.  Genitourinary: Negative for dysuria and flank pain.  Musculoskeletal: Negative for neck pain and neck stiffness.  Skin: Negative for rash and wound.  Allergic/Immunologic: Negative for immunocompromised state.  Neurological: Negative for syncope, weakness and headaches.  All other systems reviewed and are negative.    Physical Exam Updated Vital Signs BP 134/67 (BP Location: Right Arm)   Pulse 85   Temp 98.4 F (36.9 C) (Oral)   Resp 18   Ht 5\' 3"  (1.6 m)   Wt 72.6 kg (160 lb)   SpO2 97%   BMI 28.34 kg/m   Physical Exam  Constitutional: She is oriented to person, place, and time. She appears well-developed and well-nourished. No distress.  HENT:  Head: Normocephalic and atraumatic.  Eyes: Conjunctivae are normal.  Neck: Neck supple.  Cardiovascular: Normal rate, regular rhythm and normal heart sounds.  Exam reveals no friction rub.   No murmur heard. Pulmonary/Chest: Effort normal and breath sounds normal. No respiratory distress. She has no wheezes. She has no rales.  Abdominal: Normal appearance. She exhibits no distension. There is tenderness in the right lower quadrant, periumbilical area, suprapubic area and left lower quadrant. There is guarding. There is no rigidity and no rebound.  Musculoskeletal: She exhibits no edema.  Neurological: She is alert and oriented to person, place, and time. She exhibits normal muscle tone.  Skin: Skin is warm. Capillary refill takes less than 2 seconds.  Psychiatric: She has a normal mood and affect.  Nursing note and vitals reviewed.    ED Treatments / Results  Labs (all labs ordered are listed, but only abnormal results are displayed) Labs Reviewed  COMPREHENSIVE METABOLIC PANEL - Abnormal; Notable for the following:       Result Value   Sodium 134 (*)    Chloride 97 (*)    Glucose, Bld 288 (*)    All other components within normal limits  CBC - Abnormal; Notable for the following:    WBC 15.3 (*)      RBC 5.89 (*)    Hemoglobin 11.6 (*)    HCT 35.3 (*)    MCV 59.9 (*)    MCH 19.7 (*)    RDW 18.5 (*)    All other components within normal limits  URINALYSIS, ROUTINE W REFLEX MICROSCOPIC - Abnormal; Notable for the following:    Glucose, UA >=500 (*)    All other components within normal limits  URINALYSIS, MICROSCOPIC (REFLEX) - Abnormal; Notable for the following:    Bacteria, UA RARE (*)    Squamous Epithelial / LPF 0-5 (*)    All other components within normal limits  DIFFERENTIAL - Abnormal; Notable for the following:    Neutro Abs 9.1 (*)  Lymphs Abs 5.1 (*)    All other components within normal limits  LIPASE, BLOOD    EKG  EKG Interpretation None       Radiology Ct Abdomen Pelvis W Contrast  Result Date: 12/04/2016 CLINICAL DATA:  Diffuse abdominal pain, worse in the lower abdomen. Nausea. Diarrhea. EXAM: CT ABDOMEN AND PELVIS WITH CONTRAST TECHNIQUE: Multidetector CT imaging of the abdomen and pelvis was performed using the standard protocol following bolus administration of intravenous contrast. CONTRAST:  177mL ISOVUE-300 IOPAMIDOL (ISOVUE-300) INJECTION 61% COMPARISON:  CT 09/25/2013 FINDINGS: Lower chest: Lung bases are clear.  Mitral annulus calcifications. Hepatobiliary: Tiny subcapsular 5 mm hypodensity in the right lobe of the liver is unchanged from prior exam. No new or suspicious hepatic lesion. Clips in the gallbladder fossa postcholecystectomy. No biliary dilatation. Pancreas: No ductal dilatation or inflammation. Spleen: Normal in size without focal abnormality. Adrenals/Urinary Tract: Unchanged 2.1 x 1.9 cm left adrenal nodule, stable dating back to 2010 CT and consistent with benign etiology such as adenoma. Normal right adrenal gland. No hydronephrosis or perinephric edema. Homogeneous renal enhancement with symmetric excretion on delayed phase imaging. Urinary bladder is physiologically distended, no bladder wall thickening. Stomach/Bowel: Tiny hiatal  hernia. Stomach distended with ingested contents. Small duodenal diverticulum. No small bowel obstruction, wall thickening or inflammation. Post appendectomy. Moderate stool in the proximal colon, distal colon is decompressed. Difficult to exclude colonic thickening in the decompressed colon, however no pericolonic inflammation. Enteric sutures in the rectosigmoid without abnormal wall thickening or mass. Vascular/Lymphatic: No significant vascular findings are present. No enlarged abdominal or pelvic lymph nodes. Reproductive: Calcified and noncalcified atheromatous plaque in the abdominal aorta and its branches. No aneurysm. No abdominal or pelvic adenopathy. Other: No free air, free fluid, or intra-abdominal fluid collection. Tiny fat containing umbilical hernia. Postsurgical change of the lower anterior abdominal wall. Musculoskeletal: There are no acute or suspicious osseous abnormalities. IMPRESSION: 1. Colon from the splenic flexure distally is decompressed, difficult to exclude mild wall thickening/colitis with certainty, however no pericolonic edema. 2. Otherwise no acute abnormality in the abdomen/pelvis. 3. Stable benign left adrenal adenoma. 4. Aortic atherosclerosis. Electronically Signed   By: Jeb Levering M.D.   On: 12/04/2016 00:56    Procedures Procedures (including critical care time)  Medications Ordered in ED Medications  sodium chloride 0.9 % bolus 1,000 mL (0 mLs Intravenous Stopped 12/04/16 0039)  morphine 4 MG/ML injection 6 mg (6 mg Intravenous Given 12/03/16 2342)  ondansetron (ZOFRAN) injection 4 mg (4 mg Intravenous Given 12/03/16 2341)  iopamidol (ISOVUE-300) 61 % injection 100 mL (100 mLs Intravenous Contrast Given 12/04/16 0027)  HYDROmorphone (DILAUDID) injection 1 mg (1 mg Intravenous Given 12/04/16 0116)  ciprofloxacin (CIPRO) tablet 500 mg (500 mg Oral Given 12/04/16 0115)  metroNIDAZOLE (FLAGYL) tablet 500 mg (500 mg Oral Given 12/04/16 0115)     Initial Impression /  Assessment and Plan / ED Course  I have reviewed the triage vital signs and the nursing notes.  Pertinent labs & imaging results that were available during my care of the patient were reviewed by me and considered in my medical decision making (see chart for details).     61 yo F with PMHx as above here with diffuse abdominal pain. Exam as above, VSS on arrival. Lab work shows moderate leukocytosis, mildly increased from prior. UA without evidence of UTI. LFTs, renal function si at baseline. Pt hyperglycemic but not in DKA. Lipase wnl. Pt given IVF, will check CT scan. No vaginal bleeding, discharge,  or s/s to suggest PID or GU etiology at this time.  CT scan shows mild colitis. No perf, obstruction, or other significant abnormality. Pt feels improved in ED and is tolerating PO. Will start on cipro/flagyl, d/c with outpt follow-up. Discussed need for colonoscopy once resolved. Otherwise, I also discussed pt's anemia - she has a h/o same and has not been on her vitamins. Will advise MVI with iron, outpt follow-up. No bloody stools here. Not on blood thinners.  Final Clinical Impressions(s) / ED Diagnoses   Final diagnoses:  Colitis  Anemia, unspecified type  Other elevated white blood cell (WBC) count    New Prescriptions Current Discharge Medication List    START taking these medications   Details  ciprofloxacin (CIPRO) 500 MG tablet Take 1 tablet (500 mg total) by mouth every 12 (twelve) hours. Qty: 20 tablet, Refills: 0    docusate sodium (COLACE) 250 MG capsule Take 1 capsule (250 mg total) by mouth daily. While taking pain medications Qty: 10 capsule, Refills: 0    metroNIDAZOLE (FLAGYL) 500 MG tablet Take 1 tablet (500 mg total) by mouth 3 (three) times daily. Qty: 30 tablet, Refills: 0    ondansetron (ZOFRAN ODT) 4 MG disintegrating tablet Take 1 tablet (4 mg total) by mouth every 8 (eight) hours as needed for nausea or vomiting. Qty: 12 tablet, Refills: 0      oxyCODONE-acetaminophen (PERCOCET/ROXICET) 5-325 MG tablet Take 1-2 tablets by mouth every 4 (four) hours as needed for moderate pain or severe pain. Qty: 15 tablet, Refills: 0         Duffy Bruce, MD 12/04/16 [phone]

## 2016-12-03 NOTE — ED Triage Notes (Signed)
pateint states that she is having suprapubic pain "all day" with diarrhea Reports that she had similar problems 2 weeks ago and her "white count was 19" and they wanted her to come to the emergency room. Patient reports that she is having Nausea but took a phenergan and she had not thrown up.

## 2016-12-04 LAB — DIFFERENTIAL
BASOS PCT: 0 %
Basophils Absolute: 0 10*3/uL (ref 0.0–0.1)
EOS PCT: 3 %
Eosinophils Absolute: 0.5 10*3/uL (ref 0.0–0.7)
LYMPHS PCT: 33 %
Lymphs Abs: 5.1 10*3/uL — ABNORMAL HIGH (ref 0.7–4.0)
Monocytes Absolute: 0.6 10*3/uL (ref 0.1–1.0)
Monocytes Relative: 4 %
Neutro Abs: 9.1 10*3/uL — ABNORMAL HIGH (ref 1.7–7.7)
Neutrophils Relative %: 60 %

## 2016-12-04 LAB — COMPREHENSIVE METABOLIC PANEL
ALBUMIN: 3.7 g/dL (ref 3.5–5.0)
ALT: 15 U/L (ref 14–54)
AST: 19 U/L (ref 15–41)
Alkaline Phosphatase: 85 U/L (ref 38–126)
Anion gap: 10 (ref 5–15)
BILIRUBIN TOTAL: 0.3 mg/dL (ref 0.3–1.2)
BUN: 12 mg/dL (ref 6–20)
CHLORIDE: 97 mmol/L — AB (ref 101–111)
CO2: 27 mmol/L (ref 22–32)
CREATININE: 0.77 mg/dL (ref 0.44–1.00)
Calcium: 8.9 mg/dL (ref 8.9–10.3)
GFR calc Af Amer: >60 mL/min (ref >60)
GLUCOSE: 288 mg/dL — AB (ref 65–99)
Potassium: 3.9 mmol/L (ref 3.5–5.1)
Sodium: 134 mmol/L — ABNORMAL LOW (ref 135–145)
TOTAL PROTEIN: 7 g/dL (ref 6.5–8.1)

## 2016-12-04 LAB — LIPASE, BLOOD: Lipase: 26 U/L (ref 11–51)

## 2016-12-04 MED ORDER — METRONIDAZOLE 500 MG PO TABS
500.0000 mg | ORAL_TABLET | Freq: Three times a day (TID) | ORAL | 0 refills | Status: AC
Start: 2016-12-04 — End: 2016-12-14

## 2016-12-04 MED ORDER — ONDANSETRON 4 MG PO TBDP: 4.0000 mg | ORAL_TABLET | Freq: Three times a day (TID) | ORAL | 0 refills | Status: DC | PRN

## 2016-12-04 MED ORDER — DOCUSATE SODIUM 250 MG PO CAPS: 250.0000 mg | ORAL_CAPSULE | Freq: Every day | ORAL | 0 refills | Status: DC

## 2016-12-04 MED ORDER — CIPROFLOXACIN HCL 500 MG PO TABS: 500.0000 mg | ORAL_TABLET | Freq: Two times a day (BID) | ORAL | 0 refills | Status: DC

## 2016-12-04 MED ORDER — IOPAMIDOL (ISOVUE-300) INJECTION 61%
100.0000 mL | Freq: Once | INTRAVENOUS | Status: AC | PRN
  Administered 2016-12-04: 100 mL via INTRAVENOUS

## 2016-12-04 MED ORDER — METRONIDAZOLE 500 MG PO TABS: 500.0000 mg | ORAL_TABLET | Freq: Three times a day (TID) | ORAL | 0 refills | Status: DC

## 2016-12-04 MED ORDER — OXYCODONE-ACETAMINOPHEN 5-325 MG PO TABS: 1.0000 | ORAL_TABLET | ORAL | 0 refills | Status: DC | PRN

## 2016-12-04 MED ORDER — CIPROFLOXACIN HCL 500 MG PO TABS
500.0000 mg | ORAL_TABLET | Freq: Once | ORAL | Status: AC
  Administered 2016-12-04: 500 mg via ORAL
  Filled 2016-12-04: qty 1

## 2016-12-04 MED ORDER — CIPROFLOXACIN HCL 500 MG PO TABS: 500.0000 mg | ORAL_TABLET | Freq: Two times a day (BID) | ORAL | 0 refills | Status: AC

## 2016-12-04 MED ORDER — METRONIDAZOLE 500 MG PO TABS
500.0000 mg | ORAL_TABLET | Freq: Once | ORAL | Status: AC
  Administered 2016-12-04: 500 mg via ORAL
  Filled 2016-12-04: qty 1

## 2016-12-04 MED ORDER — HYDROMORPHONE HCL 1 MG/ML IJ SOLN
1.0000 mg | Freq: Once | INTRAMUSCULAR | Status: AC
  Administered 2016-12-04: 1 mg via INTRAVENOUS
  Filled 2016-12-04: qty 1

## 2016-12-04 NOTE — ED Notes (Signed)
Pt verbalizes understanding of dc instructions and denies any further needs at this time.  Pt tolerated PO fluids and meds

## 2016-12-04 NOTE — Discharge Instructions (Signed)
Take the antibiotics and pain medications (with stool softeners) as prescribed  Follow-up with your doctor this week for repeat exam and check-up  Your labs also show persistent anemia. You should take an over-the-counter multivitamin WITH iron to help with this.  While taking these antibiotics, keep an eye on your blood sugar as this medication CAN cause your blood sugar to drop lower than usual.

## 2017-02-11 NOTE — Progress Notes (Deleted)
Assessment and Plan:    Continue diet and meds as discussed. Further disposition pending results of labs. Over 30 minutes of exam, counseling, chart review, and critical decision making was performed  HPI 61 y.o. female  presents for 3 month follow up on hypertension, cholesterol, prediabetes, and vitamin D deficiency.   Her blood pressure has been controlled at home, today their BP is    She does not workout. She denies chest pain, shortness of breath, dizziness. She was recently diagnosed with left breast papilloma, has not followed up due to financial hardship.  She has history of colon cancer, had recent ED visit for colitis, given cipro/flagyl in Sept, also had anemia at that time.  Lab Results  Component Value Date   WBC 15.3 (H) 12/03/2016   HGB 11.6 (L) 12/03/2016   HCT 35.3 (L) 12/03/2016   MCV 59.9 (L) 12/03/2016   PLT 385 12/03/2016    She is not on cholesterol medication and denies myalgias. Her cholesterol is not at goal. The cholesterol last visit was:   Lab Results  Component Value Date   CHOL 220 (H) 06/11/2015   HDL 26 (L) 06/11/2015   LDLCALC NOT CALC 06/11/2015   TRIG 499 (H) 06/11/2015   CHOLHDL 8.5 (H) 06/11/2015    She has been working on diet and exercise for diabetes not controlled with neuropathy,  she is on bASA, not on ACE/ARB due to hypotension she is on 500mg  1/2 daily, nesina, and glipizide 5mg  once a day , and denies hypoglycemia , polydipsia, polyuria and visual disturbances. Last A1C in the office was:  Lab Results  Component Value Date   HGBA1C 11.4 (H) 08/11/2016   Patient is on Vitamin D supplement.   Lab Results  Component Value Date   VD25OH 10 (L) 08/27/2014     She is on celexa, last visit seroquel was added on for sleep/mood stabilizing, she was depressed due to finances.   Current Medications:  Current Outpatient Prescriptions on File Prior to Visit  Medication Sig Dispense Refill  . citalopram (CELEXA) 40 MG tablet Take 1 tablet  (40 mg total) by mouth daily. 90 tablet 3  . docusate sodium (COLACE) 250 MG capsule Take 1 capsule (250 mg total) by mouth daily. While taking pain medications 10 capsule 0  . glipiZIDE (GLUCOTROL) 5 MG tablet Take to 3 times daily with meals. Do not take if your blood sugars if they are less than 150. 90 tablet 2  . metFORMIN (GLUMETZA) 500 MG (MOD) 24 hr tablet Take 500 mg by mouth daily with breakfast.    . ondansetron (ZOFRAN ODT) 4 MG disintegrating tablet Take 1 tablet (4 mg total) by mouth every 8 (eight) hours as needed for nausea or vomiting. 12 tablet 0  . oxyCODONE-acetaminophen (PERCOCET/ROXICET) 5-325 MG tablet Take 1-2 tablets by mouth every 4 (four) hours as needed for moderate pain or severe pain. 15 tablet 0  . promethazine (PHENERGAN) 25 MG tablet Take 1 tablet (25 mg total) by mouth every 6 (six) hours as needed for nausea or vomiting. 30 tablet 3  . QUEtiapine (SEROQUEL) 25 MG tablet Take 1 tablet (25 mg total) by mouth at bedtime. 90 tablet 0   No current facility-administered medications on file prior to visit.    Medical History:  Past Medical History:  Diagnosis Date  . Anxiety   . Cervical dysplasia   . Colon cancer ([place])   . Depression   . Diabetes ([place])   . Hyperlipidemia   .  Vitamin D deficiency    Allergies:  Allergies  Allergen Reactions  . Codeine   . Lunesta [Eszopiclone]     Excessive fatigue  . Welchol [Colesevelam Hcl]     nausea      Review of Systems:  Review of Systems  Constitutional: Negative for chills, diaphoresis, fever, malaise/fatigue and weight loss.  HENT: Negative.   Respiratory: Negative.   Cardiovascular: Negative.   Gastrointestinal: Negative for abdominal pain, blood in stool, constipation, diarrhea, heartburn, melena, nausea and vomiting.  Genitourinary: Negative.   Musculoskeletal: Negative.   Skin: Negative.   Neurological: Negative for weakness.  Psychiatric/Behavioral: Negative.     Family history- Review and  unchanged Social history- Review and unchanged Physical Exam: There were no vitals taken for this visit. Wt Readings from Last 3 Encounters:  12/03/16 160 lb (72.6 kg)  08/11/16 160 lb (72.6 kg)  10/09/15 163 lb (73.9 kg)   General Appearance: Well nourished, in no apparent distress. Eyes: PERRLA, EOMs, conjunctiva no swelling or erythema Sinuses: No Frontal/maxillary tenderness ENT/Mouth: Ext aud canals clear, TMs without erythema, bulging. No erythema, swelling, or exudate on post pharynx.  Tonsils not swollen or erythematous. Hearing normal.  Neck: Supple, thyroid normal.  Respiratory: Respiratory effort normal, BS equal bilaterally without rales, rhonchi, wheezing or stridor.  Cardio: RRR with no MRGs. Brisk peripheral pulses without edema.  Abdomen: Soft, + BS,  Non tender, no guarding, rebound, hernias, masses. Lymphatics: Non tender without lymphadenopathy.  Musculoskeletal: Full ROM, 5/5 strength, Normal gait Skin: Warm, dry without rashes, lesions, ecchymosis.  Neuro: Cranial nerves intact. Normal muscle tone, no cerebellar symptoms. Psych: Awake and oriented X 3, normal affect, Insight and Judgment appropriate.    [name], PA-C 7:30 AM [facility]

## 2017-02-14 ENCOUNTER — Ambulatory Visit: Payer: Self-pay | Admitting: Internal Medicine

## 2017-02-14 ENCOUNTER — Ambulatory Visit: Payer: Self-pay | Admitting: Physician Assistant

## 2017-02-21 ENCOUNTER — Other Ambulatory Visit: Payer: Self-pay | Admitting: Physician Assistant

## 2017-02-21 ENCOUNTER — Encounter: Payer: Self-pay | Admitting: Physician Assistant

## 2017-02-21 MED ORDER — QUETIAPINE FUMARATE 25 MG PO TABS: 25.0000 mg | ORAL_TABLET | Freq: Every day | ORAL | 0 refills | Status: DC

## 2017-03-01 NOTE — Progress Notes (Signed)
Assessment and Plan:  Diagnoses and all orders for this visit:  Type 2 diabetes mellitus with hyperglycemia, without long-term current use of insulin ([name]) -     Hemoglobin A1c -     metFORMIN (GLUMETZA) 500 MG (MOD) 24 hr tablet; Take 1 tablet (500 mg total) by mouth daily with breakfast.  Mixed hyperlipidemia -     Lipid panel -     simvastatin (ZOCOR) 20 MG tablet; Take 1 tablet (20 mg total) by mouth every evening.  Medication management  Essential hypertension -     CBC with Differential/Platelet -     Comprehensive metabolic panel  Irritable bowel syndrome with constipation -     lactulose (CHRONULAC) 10 GM/15ML solution; Start out 11ml or 10gm once daily for constipation, can increase to 85ml twice daily  Depression, major, recurrent, in partial remission ([name]) -     QUEtiapine (SEROQUEL) 25 MG tablet; Take 1 tablet (25 mg total) by mouth at bedtime. -     citalopram (CELEXA) 40 MG tablet; Take 1 tablet (40 mg total) by mouth daily.   Needs to follow up q 3 months Continue diet and meds as discussed. Further disposition pending results of labs. Over 30 minutes of exam, counseling, chart review, and critical decision making was performed Future Appointments Date Time Provider [place]  06/13/2017 3:30 PM [name], PA-C [id number] None    HPI 61 y.o. female  presents for 3 month follow up on hypertension, cholesterol, prediabetes, and vitamin D deficiency.   Her blood pressure has been controlled at home, today their BP is BP: 118/76  She does not workout. She denies chest pain, shortness of breath, dizziness. She was recently diagnosed with left breast papilloma, has not followed up due to financial hardship.  She has history of colon cancer 2010, had recent ED visit for colitis, given cipro/flagyl in July, also had anemia at that time.  Lab Results  Component Value Date   WBC 15.3 (H) 12/03/2016   HGB 11.6 (L) 12/03/2016   HCT 35.3 (L) 12/03/2016   MCV 59.9 (L) 12/03/2016   PLT 385 12/03/2016    She is not on cholesterol medication and denies myalgias. Her cholesterol is not at goal. The cholesterol last visit was:   Lab Results  Component Value Date   CHOL 220 (H) 06/11/2015   HDL 26 (L) 06/11/2015   LDLCALC NOT CALC 06/11/2015   TRIG 499 (H) 06/11/2015   CHOLHDL 8.5 (H) 06/11/2015    She has been working on diet and exercise for diabetes not controlled with neuropathy,  she is on bASA, not on ACE/ARB due to hypotension she is on 500mg  1/2 daily,and glipizide 5mg  once a day, she does not check her sugars, and denies hypoglycemia , polydipsia, polyuria and visual disturbances. Last A1C in the office was:  Lab Results  Component Value Date   HGBA1C 11.4 (H) 08/11/2016   Patient is on Vitamin D supplement.   Lab Results  Component Value Date   VD25OH 10 (L) 08/27/2014     She is on celexa, last visit seroquel was added on for sleep/mood stabilizing, she was depressed due to finances.  BMI is Body mass index is 28.02 kg/m., she is working on diet and exercise. Wt Readings from Last 3 Encounters:  03/02/17 158 lb 3.2 oz (71.8 kg)  12/03/16 160 lb (72.6 kg)  08/11/16 160 lb (72.6 kg)    Current Medications:  Current Outpatient Prescriptions on File Prior to Visit  Medication Sig Dispense Refill  . docusate sodium (COLACE) 250 MG capsule Take 1 capsule (250 mg total) by mouth daily. While taking pain medications 10 capsule 0  . glipiZIDE (GLUCOTROL) 5 MG tablet Take to 3 times daily with meals. Do not take if your blood sugars if they are less than 150. 90 tablet 2  . promethazine (PHENERGAN) 25 MG tablet Take 1 tablet (25 mg total) by mouth every 6 (six) hours as needed for nausea or vomiting. 30 tablet 3   No current facility-administered medications on file prior to visit.    Medical History:  Past Medical History:  Diagnosis Date  . Anxiety   . Cervical dysplasia   . Colon cancer ([place])   . Depression   . Diabetes  ([place])   . Hyperlipidemia   . Vitamin D deficiency    Allergies:  Allergies  Allergen Reactions  . Codeine   . Lunesta [Eszopiclone]     Excessive fatigue  . Welchol [Colesevelam Hcl]     nausea      Review of Systems:  Review of Systems  Constitutional: Negative for chills, diaphoresis, fever, malaise/fatigue and weight loss.  HENT: Negative.   Respiratory: Negative.   Cardiovascular: Negative.   Gastrointestinal: Negative for abdominal pain, blood in stool, constipation, diarrhea, heartburn, melena, nausea and vomiting.  Genitourinary: Negative.   Musculoskeletal: Negative.   Skin: Negative.   Neurological: Negative for weakness.  Psychiatric/Behavioral: Negative.     Family history- Review and unchanged Social history- Review and unchanged Physical Exam: BP 118/76   Pulse 100   Temp 97.6 F (36.4 C)   Resp 14   Ht 5\' 3"  (1.6 m)   Wt 158 lb 3.2 oz (71.8 kg)   SpO2 95%   BMI 28.02 kg/m  Wt Readings from Last 3 Encounters:  03/02/17 158 lb 3.2 oz (71.8 kg)  12/03/16 160 lb (72.6 kg)  08/11/16 160 lb (72.6 kg)   General Appearance: Well nourished, in no apparent distress. Eyes: PERRLA, EOMs, conjunctiva no swelling or erythema Sinuses: No Frontal/maxillary tenderness ENT/Mouth: Ext aud canals clear, TMs without erythema, bulging. No erythema, swelling, or exudate on post pharynx.  Tonsils not swollen or erythematous. Hearing normal.  Neck: Supple, thyroid normal.  Respiratory: Respiratory effort normal, BS equal bilaterally without rales, rhonchi, wheezing or stridor.  Cardio: RRR with no MRGs. Brisk peripheral pulses without edema.  Abdomen: Soft, + BS,  Non tender, no guarding, rebound, hernias, masses. Lymphatics: Non tender without lymphadenopathy.  Musculoskeletal: Full ROM, 5/5 strength, Normal gait Skin: Warm, dry without rashes, lesions, ecchymosis.  Neuro: Cranial nerves intact. Normal muscle tone, no cerebellar symptoms. Psych: Awake and oriented X  3, normal affect, Insight and Judgment appropriate.    [name], PA-C 3:55 PM [facility]

## 2017-03-02 ENCOUNTER — Encounter: Payer: Self-pay | Admitting: Physician Assistant

## 2017-03-02 ENCOUNTER — Ambulatory Visit (INDEPENDENT_AMBULATORY_CARE_PROVIDER_SITE_OTHER): Payer: Self-pay | Admitting: Physician Assistant

## 2017-03-02 VITALS — BP 118/76 | HR 100 | Temp 97.6°F | Resp 14 | Ht 63.0 in | Wt 158.2 lb

## 2017-03-02 DIAGNOSIS — E782 Mixed hyperlipidemia: Secondary | ICD-10-CM

## 2017-03-02 DIAGNOSIS — Z79899 Other long term (current) drug therapy: Secondary | ICD-10-CM

## 2017-03-02 DIAGNOSIS — K581 Irritable bowel syndrome with constipation: Secondary | ICD-10-CM

## 2017-03-02 DIAGNOSIS — I10 Essential (primary) hypertension: Secondary | ICD-10-CM

## 2017-03-02 DIAGNOSIS — F3341 Major depressive disorder, recurrent, in partial remission: Secondary | ICD-10-CM

## 2017-03-02 DIAGNOSIS — E1165 Type 2 diabetes mellitus with hyperglycemia: Secondary | ICD-10-CM

## 2017-03-02 MED ORDER — SIMVASTATIN 20 MG PO TABS: 20.0000 mg | ORAL_TABLET | Freq: Every evening | ORAL | 0 refills | Status: DC

## 2017-03-02 MED ORDER — QUETIAPINE FUMARATE 25 MG PO TABS: 25.0000 mg | ORAL_TABLET | Freq: Every day | ORAL | 1 refills | Status: DC

## 2017-03-02 MED ORDER — LACTULOSE 10 GM/15ML PO SOLN: ORAL | 0 refills | Status: DC

## 2017-03-02 MED ORDER — CITALOPRAM HYDROBROMIDE 40 MG PO TABS: 40.0000 mg | ORAL_TABLET | Freq: Every day | ORAL | 1 refills | Status: DC

## 2017-03-02 MED ORDER — METFORMIN HCL ER (MOD) 500 MG PO TB24: 500.0000 mg | ORAL_TABLET | Freq: Every day | ORAL | 1 refills | Status: DC

## 2017-03-02 NOTE — Patient Instructions (Addendum)
[facility] get meter for free if you buy their strips.  Increase glipizide to 10mg  at one time with food Highly suggest you start checking your sugars  Will start you on a cholesterol medication for your trigs and LDL and to help try to prevent heart attack and stroke   Vitamin D goal is between 60-80  Please make sure that you are taking your Vitamin D as directed.   It is very important as a natural anti-inflammatory   helping hair, skin, and nails, as well as reducing stroke and heart attack risk.   It helps your bones and helps with mood.  We want you on at least 5000 IU daily  It also decreases numerous cancer risks so please take it as directed.   Low Vit D is associated with a 200-300% higher risk for CANCER   and 200-300% higher risk for HEART   ATTACK  &  STROKE.    .....................................Marland Kitchen  It is also associated with higher death rate at younger ages,   autoimmune diseases like Rheumatoid arthritis, Lupus, Multiple Sclerosis.     Also many other serious conditions, like depression, Alzheimer's  Dementia, infertility, muscle aches, fatigue, fibromyalgia - just to name a few.  +++++++++++++++++++  Can get liquid vitamin D from [facility] here in [place] at  [facility] alternatives [address], [place], [place] [address] Or you can try earth fare      Bad carbs also include fruit juice, alcohol, and sweet tea. These are empty calories that do not signal to your brain that you are full.   Please remember the good carbs are still carbs which convert into sugar. So please measure them out no more than 1/2-1 cup of rice, oatmeal, pasta, and beans  Veggies are however free foods! Pile them on.   Not all fruit is created equal. Please see the list below, the fruit at the bottom is higher in sugars than the fruit at the top. Please avoid all dried fruits.     About Constipation  Constipation Overview Constipation is the most common gastrointestinal  complaint - about 4 million Americans experience constipation and make 2.5 million physician visits a year to get help for the problem.  Constipation can occur when the colon absorbs too much water, the colon's muscle contraction is slow or sluggish, and/or there is delayed transit time through the colon.  The result is stool that is hard and dry.  Indicators of constipation include straining during bowel movements greater than 25% of the time, having fewer than three bowel movements per week, and/or the feeling of incomplete evacuation.  There are established guidelines ([place] II ) for defining constipation. A person needs to have two or more of the following symptoms for at least 12 weeks (not necessarily consecutive) in the preceding 12 months: . Straining in  greater than 25% of bowel movements . Lumpy or hard stools in greater than 25% of bowel movements . Sensation of incomplete emptying in greater than 25% of bowel movements . Sensation of anorectal obstruction/blockade in greater than 25% of bowel movements . Manual maneuvers to help empty greater than 25% of bowel movements (e.g., digital evacuation, support of the pelvic floor)  . Less than  3 bowel movements/week . Loose stools are not present, and criteria for irritable bowel syndrome are insufficient  Common Causes of Constipation . Lack of fiber in your diet . Lack of physical activity . Medications, including iron and calcium supplements  . Dairy intake .  Dehydration . Abuse of laxatives  Travel  Irritable Bowel Syndrome  Pregnancy  Luteal phase of menstruation (after ovulation and before menses)  Colorectal problems  Intestinal Dysfunction  Treating Constipation  There are several ways of treating constipation, including changes to diet and exercise, use of laxatives, adjustments to the pelvic floor, and scheduled toileting.  These treatments include: . increasing fiber and fluids in the diet  . increasing physical  activity . learning muscle coordination   learning proper toileting techniques and toileting modifications   designing and sticking  to a toileting schedule     2007, [employer] Doc.22

## 2017-03-03 LAB — COMPREHENSIVE METABOLIC PANEL
AG RATIO: 1.4 (calc) (ref 1.0–2.5)
ALT: 11 U/L (ref 6–29)
AST: 11 U/L (ref 10–35)
Albumin: 4.2 g/dL (ref 3.6–5.1)
Alkaline phosphatase (APISO): 95 U/L (ref 33–130)
BILIRUBIN TOTAL: 0.5 mg/dL (ref 0.2–1.2)
BUN: 13 mg/dL (ref 7–25)
CHLORIDE: 98 mmol/L (ref 98–110)
CO2: 25 mmol/L (ref 20–32)
Calcium: 9.6 mg/dL (ref 8.6–10.4)
Creat: 0.91 mg/dL (ref 0.50–0.99)
GLOBULIN: 3 g/dL (ref 1.9–3.7)
Glucose, Bld: 265 mg/dL — ABNORMAL HIGH (ref 65–99)
POTASSIUM: 5 mmol/L (ref 3.5–5.3)
SODIUM: 134 mmol/L — AB (ref 135–146)
TOTAL PROTEIN: 7.2 g/dL (ref 6.1–8.1)

## 2017-03-03 LAB — CBC WITH DIFFERENTIAL/PLATELET
Basophils Absolute: 115 cells/uL (ref 0–200)
Basophils Relative: 0.7 %
EOS ABS: 197 {cells}/uL (ref 15–500)
Eosinophils Relative: 1.2 %
HEMATOCRIT: 38 % (ref 35.0–45.0)
HEMOGLOBIN: 11.9 g/dL (ref 11.7–15.5)
LYMPHS ABS: 5248 {cells}/uL — AB (ref 850–3900)
MCH: 19.1 pg — ABNORMAL LOW (ref 27.0–33.0)
MCHC: 31.3 g/dL — ABNORMAL LOW (ref 32.0–36.0)
MCV: 60.9 fL — AB (ref 80.0–100.0)
MPV: 10 fL (ref 7.5–12.5)
Monocytes Relative: 4 %
NEUTROS ABS: 10184 {cells}/uL — AB (ref 1500–7800)
Neutrophils Relative %: 62.1 %
Platelets: 480 10*3/uL — ABNORMAL HIGH (ref 140–400)
RBC: 6.24 10*6/uL — ABNORMAL HIGH (ref 3.80–5.10)
RDW: 17 % — ABNORMAL HIGH (ref 11.0–15.0)
Total Lymphocyte: 32 %
WBC: 16.4 10*3/uL — ABNORMAL HIGH (ref 3.8–10.8)
WBCMIX: 656 {cells}/uL (ref 200–950)

## 2017-03-03 LAB — LIPID PANEL
CHOL/HDL RATIO: 9.1 (calc) — AB (ref <5.0)
CHOLESTEROL: 245 mg/dL — AB (ref <200)
HDL: 27 mg/dL — ABNORMAL LOW (ref >50)
LDL Cholesterol (Calc): 157 mg/dL (calc) — ABNORMAL HIGH
Non-HDL Cholesterol (Calc): 218 mg/dL (calc) — ABNORMAL HIGH (ref <130)
Triglycerides: 396 mg/dL — ABNORMAL HIGH (ref <150)

## 2017-03-03 LAB — HEMOGLOBIN A1C
EAG (MMOL/L): 12 (calc)
HEMOGLOBIN A1C: 9.2 %{Hb} — AB (ref <5.7)
MEAN PLASMA GLUCOSE: 217 (calc)

## 2017-04-25 ENCOUNTER — Ambulatory Visit: Payer: Self-pay | Admitting: Physician Assistant

## 2017-06-02 ENCOUNTER — Encounter: Payer: Self-pay | Admitting: Physician Assistant

## 2017-06-02 ENCOUNTER — Ambulatory Visit (INDEPENDENT_AMBULATORY_CARE_PROVIDER_SITE_OTHER): Payer: Self-pay | Admitting: Physician Assistant

## 2017-06-02 VITALS — BP 110/68 | HR 100 | Temp 97.6°F | Resp 14 | Wt 155.2 lb

## 2017-06-02 DIAGNOSIS — E782 Mixed hyperlipidemia: Secondary | ICD-10-CM

## 2017-06-02 DIAGNOSIS — R7309 Other abnormal glucose: Secondary | ICD-10-CM

## 2017-06-02 DIAGNOSIS — F3341 Major depressive disorder, recurrent, in partial remission: Secondary | ICD-10-CM

## 2017-06-02 DIAGNOSIS — E1169 Type 2 diabetes mellitus with other specified complication: Secondary | ICD-10-CM

## 2017-06-02 DIAGNOSIS — D649 Anemia, unspecified: Secondary | ICD-10-CM

## 2017-06-02 DIAGNOSIS — E785 Hyperlipidemia, unspecified: Secondary | ICD-10-CM

## 2017-06-02 MED ORDER — SIMVASTATIN 20 MG PO TABS: 20.0000 mg | ORAL_TABLET | Freq: Every evening | ORAL | 1 refills | Status: DC

## 2017-06-02 MED ORDER — QUETIAPINE FUMARATE 25 MG PO TABS: 25.0000 mg | ORAL_TABLET | Freq: Every day | ORAL | 0 refills | Status: DC

## 2017-06-02 NOTE — Progress Notes (Signed)
Assessment and Plan:   Hemoglobin A1C greater than 9%, indicating poor diabetic control Goal is below 8 Take glipizide DAILY not just as needed Try 500mg  1 metformin a day Check sugars 1-2 x daily and bring in log next OV Try to do better snack than nabs, try protein bar  Mixed hyperlipidemia -continue medications, check lipids, decrease fatty foods, increase activity.  -     Lipid panel -     simvastatin (ZOCOR) 20 MG tablet; Take 1 tablet (20 mg total) by mouth every evening.  Depression, major, recurrent, in partial remission ([name]) -     QUEtiapine (SEROQUEL) 25 MG tablet; Take 1 tablet (25 mg total) by mouth at bedtime. -     TSH  Type 2 diabetes mellitus with hyperlipidemia ([place]) Discussed general issues about diabetes pathophysiology and management., Educational material distributed., Suggested low cholesterol diet., Encouraged aerobic exercise., Discussed foot care., Reminded to get yearly retinal exam. -     Lipid panel -     Hemoglobin A1c -     BASIC METABOLIC PANEL WITH GFR -     CBC with Differential/Platelet  Anemia, unspecified type She is having fatigue, check iron/TSH -     Iron,Total/Total Iron Binding Cap  Needs to follow up q 3 months Continue diet and meds as discussed. Further disposition pending results of labs. Over 30 minutes of exam, counseling, chart review, and critical decision making was performed Future Appointments  Date Time Provider [place]  06/13/2017  3:30 PM [name], PA-C [id number] None    HPI 62 y.o. female  presents for 3 month follow up on hypertension, cholesterol, prediabetes, and vitamin D deficiency.   Her blood pressure has been controlled at home, today their BP is BP: 110/68  She does not workout. She denies chest pain, shortness of breath, dizziness. She has history of colon cancer 2010 Lab Results  Component Value Date   WBC 16.4 (H) 03/02/2017   HGB 11.9 03/02/2017   HCT 38.0 03/02/2017   MCV 60.9 (L)  03/02/2017   PLT 480 (H) 03/02/2017    She is on cholesterol medication, simvastatin 20mg  since last visit and denies myalgias. Her cholesterol is not at goal. The cholesterol last visit was:   Lab Results  Component Value Date   CHOL 245 (H) 03/02/2017   HDL 27 (L) 03/02/2017   LDLCALC NOT CALC 06/11/2015   TRIG 396 (H) 03/02/2017   CHOLHDL 9.1 (H) 03/02/2017    She has been working on diet and exercise for diabetes not controlled with neuropathy,  she is on bASA, not on ACE/ARB due to hypotension she is on 500mg  1/2 daily,and glipizide 5mg  once a day after she eats, she does not check her sugars, and denies hypoglycemia , polydipsia, polyuria and visual disturbances. Last A1C in the office was:  Lab Results  Component Value Date   HGBA1C 9.2 (H) 03/02/2017   Patient is on Vitamin D supplement.   Lab Results  Component Value Date   VD25OH 10 (L) 08/27/2014     She is on celexa, last visit seroquel was added on for sleep/mood stabilizing, she was depressed due to finances.  BMI is Body mass index is 27.49 kg/m., she is working on diet and exercise. Wt Readings from Last 3 Encounters:  06/02/17 155 lb 3.2 oz (70.4 kg)  03/02/17 158 lb 3.2 oz (71.8 kg)  12/03/16 160 lb (72.6 kg)    Current Medications:  Current Outpatient Medications on File Prior to  Visit  Medication Sig Dispense Refill  . citalopram (CELEXA) 40 MG tablet Take 1 tablet (40 mg total) by mouth daily. 90 tablet 1  . docusate sodium (COLACE) 250 MG capsule Take 1 capsule (250 mg total) by mouth daily. While taking pain medications 10 capsule 0  . glipiZIDE (GLUCOTROL) 5 MG tablet Take to 3 times daily with meals. Do not take if your blood sugars if they are less than 150. 90 tablet 2  . lactulose (CHRONULAC) 10 GM/15ML solution Start out 59ml or 10gm once daily for constipation, can increase to 15ml twice daily 240 mL 0  . metFORMIN (GLUMETZA) 500 MG (MOD) 24 hr tablet Take 1 tablet (500 mg total) by mouth daily with  breakfast. 90 tablet 1  . promethazine (PHENERGAN) 25 MG tablet Take 1 tablet (25 mg total) by mouth every 6 (six) hours as needed for nausea or vomiting. 30 tablet 3  . QUEtiapine (SEROQUEL) 25 MG tablet Take 1 tablet (25 mg total) by mouth at bedtime. 90 tablet 1  . simvastatin (ZOCOR) 20 MG tablet Take 1 tablet (20 mg total) by mouth every evening. 90 tablet 0   No current facility-administered medications on file prior to visit.    Medical History:  Past Medical History:  Diagnosis Date  . Anxiety   . Cervical dysplasia   . Colon cancer ([place])   . Depression   . Diabetes ([place])   . Hyperlipidemia   . Vitamin D deficiency    Allergies:  Allergies  Allergen Reactions  . Codeine   . Lunesta [Eszopiclone]     Excessive fatigue  . Welchol [Colesevelam Hcl]     nausea      Review of Systems:  Review of Systems  Constitutional: Negative for chills, diaphoresis, fever, malaise/fatigue and weight loss.  HENT: Negative.   Respiratory: Negative.   Cardiovascular: Negative.   Gastrointestinal: Negative for abdominal pain, blood in stool, constipation, diarrhea, heartburn, melena, nausea and vomiting.  Genitourinary: Negative.   Musculoskeletal: Negative.   Skin: Negative.   Neurological: Negative for weakness.  Psychiatric/Behavioral: Negative.     Family history- Review and unchanged Social history- Review and unchanged Physical Exam: BP 110/68   Pulse 100   Temp 97.6 F (36.4 C)   Resp 14   Wt 155 lb 3.2 oz (70.4 kg)   SpO2 96%   BMI 27.49 kg/m  Wt Readings from Last 3 Encounters:  06/02/17 155 lb 3.2 oz (70.4 kg)  03/02/17 158 lb 3.2 oz (71.8 kg)  12/03/16 160 lb (72.6 kg)   General Appearance: Well nourished, in no apparent distress. Eyes: PERRLA, EOMs, conjunctiva no swelling or erythema Sinuses: No Frontal/maxillary tenderness ENT/Mouth: Ext aud canals clear, TMs without erythema, bulging. No erythema, swelling, or exudate on post pharynx.  Tonsils not  swollen or erythematous. Hearing normal.  Neck: Supple, thyroid normal.  Respiratory: Respiratory effort normal, BS equal bilaterally without rales, rhonchi, wheezing or stridor.  Cardio: RRR with no MRGs. Brisk peripheral pulses without edema.  Abdomen: Soft, + BS,  Non tender, no guarding, rebound, hernias, masses. Lymphatics: Non tender without lymphadenopathy.  Musculoskeletal: Full ROM, 5/5 strength, Normal gait Skin: Warm, dry without rashes, lesions, ecchymosis.  Neuro: Cranial nerves intact. Normal muscle tone, no cerebellar symptoms. Psych: Awake and oriented X 3, normal affect, Insight and Judgment appropriate.    [name], PA-C 4:51 PM [facility]

## 2017-06-02 NOTE — Patient Instructions (Addendum)
Your A1C is a measure of your sugar over the past 3 months and is not affected by what you have eaten over the past few days. Diabetes increases your chances of stroke and heart attack over 300 % and is the leading cause of blindness and kidney failure in the [place]. Please make sure you decrease bad carbs like white bread, white rice, potatoes, corn, soft drinks, pasta, cereals, refined sugars, sweet tea, dried fruits, and fruit juice. Good carbs are okay to eat in moderation like sweet potatoes, brown rice, whole grain pasta/bread, most fruit (except dried fruit) and you can eat as many veggies as you want.   Greater than 6.5 is considered diabetic. Between 6.4 and 5.7 is prediabetic If your A1C is less than 5.7 you are NOT diabetic.  Targets for Glucose Readings: Time of Check Target for patients WITHOUT Diabetes Target for DIABETICS  Before Meals Less than 100  less than 150  Two hours after meals Less than 200  Less than 250   Check with [facility], can get their meter for free if you buy their strips Check your sugar twice a day for a month and keep a log Check in the AM and check before dinner or 2 hours after dinner just mark if before or after food  Take glipizide once a day with dinner/food  If your morning sugar is always below 100 then the issue is with your sugar spiking after meals. Try to take your blood sugar approximately 2 hours after eating, this number should be less than 200. If it is not, think about the foods that you ate and better choices you can make.    We are starting you on Metformin to prevent or treat diabetes.  Metformin does NOT cause low blood sugars.   In order to create energy your cells need insulin and sugar but sometime your cells do not accept the insulin and this can cause increased sugars and decreased energy.   The Metformin helps your cells accept insulin and the sugar this help: 1) increase your energy  2) weight loss.    The two most  common side effects are nausea and diarrhea, follow these rules to avoid it but these symptoms get better with time on the medication.    ALSO You can take imodium per box instructions when starting metformin if needed.  Can take zantac 150mg  with the meformin  Rules of metformin: 1) start out slow with only one pill daily. Our goal for you is 4 pills a day or 2000mg  total.  2) take with your largest meal. 3) Take with least amount of carbs.   Call if you have any problems.       Bad carbs also include fruit juice, alcohol, and sweet tea. These are empty calories that do not signal to your brain that you are full.   Please remember the good carbs are still carbs which convert into sugar. So please measure them out no more than 1/2-1 cup of rice, oatmeal, pasta, and beans  Veggies are however free foods! Pile them on.   Not all fruit is created equal. Please see the list below, the fruit at the bottom is higher in sugars than the fruit at the top. Please avoid all dried fruits.     Vitamin D goal is between 60-80  Please make sure that you are taking your Vitamin D as directed.   It is very important as a natural anti-inflammatory   helping  hair, skin, and nails, as well as reducing stroke and heart attack risk.   It helps your bones and helps with mood.  We want you on at least 5000 IU daily  It also decreases numerous cancer risks so please take it as directed.   Low Vit D is associated with a 200-300% higher risk for CANCER   and 200-300% higher risk for HEART   ATTACK  &  STROKE.    .....................................Marland Kitchen  It is also associated with higher death rate at younger ages,   autoimmune diseases like Rheumatoid arthritis, Lupus, Multiple Sclerosis.     Also many other serious conditions, like depression, Alzheimer's  Dementia, infertility, muscle aches, fatigue, fibromyalgia - just to name a few.  +++++++++++++++++++  Can get liquid vitamin D from  [facility] here in [place] at  [facility] alternatives [address] [place], [place] [address] Or you can try earth fare     Fatigue Fatigue is feeling tired all of the time, a lack of energy, or a lack of motivation. Occasional or mild fatigue is often a normal response to activity or life in general. However, long-lasting (chronic) or extreme fatigue may indicate an underlying medical condition. Follow these instructions at home: Watch your fatigue for any changes. The following actions may help to lessen any discomfort you are feeling:  Talk to your health care provider about how much sleep you need each night. Try to get the required amount every night.  Take medicines only as directed by your health care provider.  Eat a healthy and nutritious diet. Ask your health care provider if you need help changing your diet.  Drink enough fluid to keep your urine clear or pale yellow.  Practice ways of relaxing, such as yoga, meditation, massage therapy, or acupuncture.  Exercise regularly.  Change situations that cause you stress. Try to keep your work and personal routine reasonable.  Do not abuse illegal drugs.  Limit alcohol intake to no more than 1 drink per day for nonpregnant women and 2 drinks per day for men. One drink equals 12 ounces of beer, 5 ounces of wine, or 1 ounces of hard liquor.  Take a multivitamin, if directed by your health care provider.  Contact a health care provider if:  Your fatigue does not get better.  You have a fever.  You have unintentional weight loss or gain.  You have headaches.  You have difficulty: ? Falling asleep. ? Sleeping throughout the night.  You feel angry, guilty, anxious, or sad.  You are unable to have a bowel movement (constipation).  You skin is dry.  Your legs or another part of your body is swollen. Get help right away if:  You feel confused.  Your vision is blurry.  You feel faint or pass out.  You have a  severe headache.  You have severe abdominal, pelvic, or back pain.  You have chest pain, shortness of breath, or an irregular or fast heartbeat.  You are unable to urinate or you urinate less than normal.  You develop abnormal bleeding, such as bleeding from the rectum, vagina, nose, lungs, or nipples.  You vomit blood.  You have thoughts about harming yourself or committing suicide.  You are worried that you might harm someone else. This information is not intended to replace advice given to you by your health care provider. Make sure you discuss any questions you have with your health care provider. Document Released: 03/14/2007 Document Revised: 10/23/2015 Document Reviewed:  09/18/2013 Elsevier Interactive Patient Education  [employer].

## 2017-06-03 ENCOUNTER — Other Ambulatory Visit: Payer: Self-pay | Admitting: Physician Assistant

## 2017-06-03 LAB — CBC WITH DIFFERENTIAL/PLATELET
BASOS ABS: 146 {cells}/uL (ref 0–200)
Basophils Relative: 0.8 %
EOS PCT: 1.6 %
Eosinophils Absolute: 293 cells/uL (ref 15–500)
HCT: 42.9 % (ref 35.0–45.0)
Hemoglobin: 12.7 g/dL (ref 11.7–15.5)
Lymphs Abs: 5691 cells/uL — ABNORMAL HIGH (ref 850–3900)
MCH: 18.1 pg — AB (ref 27.0–33.0)
MCHC: 29.6 g/dL — ABNORMAL LOW (ref 32.0–36.0)
MCV: 61.2 fL — ABNORMAL LOW (ref 80.0–100.0)
MONOS PCT: 3.8 %
MPV: 9.9 fL (ref 7.5–12.5)
NEUTROS ABS: 11474 {cells}/uL — AB (ref 1500–7800)
Neutrophils Relative %: 62.7 %
PLATELETS: 443 10*3/uL — AB (ref 140–400)
RBC: 7.01 10*6/uL — ABNORMAL HIGH (ref 3.80–5.10)
RDW: 18.3 % — ABNORMAL HIGH (ref 11.0–15.0)
TOTAL LYMPHOCYTE: 31.1 %
WBC mixed population: 695 cells/uL (ref 200–950)
WBC: 18.3 10*3/uL — ABNORMAL HIGH (ref 3.8–10.8)

## 2017-06-03 LAB — LIPID PANEL
CHOLESTEROL: 194 mg/dL (ref <200)
HDL: 28 mg/dL — ABNORMAL LOW (ref >50)
Non-HDL Cholesterol (Calc): 166 mg/dL (calc) — ABNORMAL HIGH (ref <130)
Total CHOL/HDL Ratio: 6.9 (calc) — ABNORMAL HIGH (ref <5.0)
Triglycerides: 484 mg/dL — ABNORMAL HIGH (ref <150)

## 2017-06-03 LAB — IRON, TOTAL/TOTAL IRON BINDING CAP
%SAT: 13 % (calc) (ref 11–50)
IRON: 51 ug/dL (ref 45–160)
TIBC: 388 mcg/dL (calc) (ref 250–450)

## 2017-06-03 LAB — HEMOGLOBIN A1C
EAG (MMOL/L): 15.1 (calc)
Hgb A1c MFr Bld: 11.1 % of total Hgb — ABNORMAL HIGH (ref <5.7)
MEAN PLASMA GLUCOSE: 272 (calc)

## 2017-06-03 LAB — BASIC METABOLIC PANEL WITH GFR
BUN: 10 mg/dL (ref 7–25)
CALCIUM: 9.7 mg/dL (ref 8.6–10.4)
CHLORIDE: 97 mmol/L — AB (ref 98–110)
CO2: 29 mmol/L (ref 20–32)
Creat: 0.76 mg/dL (ref 0.50–0.99)
GFR, EST AFRICAN AMERICAN: 97 mL/min/{1.73_m2} (ref >=60)
GFR, Est Non African American: 83 mL/min/{1.73_m2} (ref >=60)
Glucose, Bld: 387 mg/dL — ABNORMAL HIGH (ref 65–99)
POTASSIUM: 4.6 mmol/L (ref 3.5–5.3)
SODIUM: 134 mmol/L — AB (ref 135–146)

## 2017-06-03 LAB — TSH: TSH: 2.63 m[IU]/L (ref 0.40–4.50)

## 2017-06-03 MED ORDER — AMOXICILLIN-POT CLAVULANATE 875-125 MG PO TABS: 1.0000 | ORAL_TABLET | Freq: Two times a day (BID) | ORAL | 0 refills | Status: AC

## 2017-06-13 ENCOUNTER — Ambulatory Visit: Payer: Self-pay | Admitting: Physician Assistant

## 2017-06-15 NOTE — Progress Notes (Signed)
Diabetes Education and Follow-Up Visit  62 y.o.female presents for diabetic education. She has Diabetes Mellitus type 2:  with diabetic chronic kidney disease, she is not on bASA but willing to start, and denies increased appetite, nausea, paresthesia of the feet, polydipsia, polyuria, visual disturbances and vomiting.   Last hemoglobin A1c was: Lab Results  Component Value Date   HGBA1C 11.1 (H) 06/02/2017   HGBA1C 9.2 (H) 03/02/2017   HGBA1C 11.4 (H) 08/11/2016   At the last visit 1 week ago she was advised:  A1C goal is below 8 Take glipizide DAILY not just as needed Try 500mg  1 metformin a day Check sugars 1-2 x daily and bring in log next OV Try to do better snack than nabs, try protein bar  BMI is Body mass index is 27.24 kg/m., she has not been working on diet and exercise. She has been traveling this past week and has been eating poorly. We discussed appropriate diabetic foods today; demonstrates fair knowledge, but admittedly has been making poor choices.  Wt Readings from Last 3 Encounters:  06/16/17 153 lb 12.8 oz (69.8 kg)  06/02/17 155 lb 3.2 oz (70.4 kg)  03/02/17 158 lb 3.2 oz (71.8 kg)   Pt is on a regimen of: glipizide 5 mg TID, metformin (generic) 500 mg daily - she has been unable to tolerate glipizide (abdominal pain) and can only tolerate 250 mg of metformin. Discussed she can trial taking 250 mg of metformin more frequently, and to try 1/2 tab glipizide to see if this improves tolerance. Discussed insulin - she is terrified of needles and will not check sugars and refuses insulin firmly. She is willing to work extensively on diet and exercise.   Pt checks her sugars 0 x day  Lowest sugar was -  She has hypoglycemia awareness.  Highest sugar was -  Glucometer: Freestyle  Exercise: Has recently joined a gym but has not started Aiming to go 3 times a week -   Patient does have CKD She is not on ACE/ARB secondary to low blood pressures  Lab Results  Component  Value Date   GFRNONAA 83 06/02/2017   Lab Results  Component Value Date   CREATININE 0.76 06/02/2017   BUN 10 06/02/2017   NA 134 (L) 06/02/2017   K 4.6 06/02/2017   CL 97 (L) 06/02/2017   CO2 29 06/02/2017   Lab Results  Component Value Date   MICROALBUR 0.50 06/26/2013    She is on a simvastatin 20 mg daily She is not at goal of less than 70.  Lab Results  Component Value Date   CHOL 194 06/02/2017   HDL 28 (L) 06/02/2017   LDLCALC NOT CALC 06/11/2015   TRIG 484 (H) 06/02/2017   CHOLHDL 6.9 (H) 06/02/2017     Problem List has Leukocytosis, unspecified; History of colon cancer, stage II; Hyperlipidemia; Diabetes ([place]); Colon cancer ([place]); Vitamin D deficiency; Anxiety; and Depression on their problem list.  Medications Current Outpatient Medications on File Prior to Visit  Medication Sig  . citalopram (CELEXA) 40 MG tablet Take 1 tablet (40 mg total) by mouth daily.  Marland Kitchen docusate sodium (COLACE) 250 MG capsule Take 1 capsule (250 mg total) by mouth daily. While taking pain medications  . metFORMIN (GLUMETZA) 500 MG (MOD) 24 hr tablet Take 1 tablet (500 mg total) by mouth daily with breakfast.  . promethazine (PHENERGAN) 25 MG tablet Take 1 tablet (25 mg total) by mouth every 6 (six) hours as needed for  nausea or vomiting.  Marland Kitchen QUEtiapine (SEROQUEL) 25 MG tablet Take 1 tablet (25 mg total) by mouth at bedtime.  . simvastatin (ZOCOR) 20 MG tablet Take 1 tablet (20 mg total) by mouth every evening.  Marland Kitchen glipiZIDE (GLUCOTROL) 5 MG tablet Take to 3 times daily with meals. Do not take if your blood sugars if they are less than 150. (Patient not taking: Reported on 06/16/2017)   No current facility-administered medications on file prior to visit.     ROS- see HPI  Physical Exam: Blood pressure 110/68, pulse 100, temperature (!) 97.5 F (36.4 C), height 5\' 3"  (1.6 m), weight 153 lb 12.8 oz (69.8 kg), SpO2 94 %. Body mass index is 27.24 kg/m. General Appearance: Well nourished,  in no apparent distress. Eyes: PERRLA, EOMs, conjunctiva no swelling or erythema ENT/Mouth: Ext aud canals clear, TMs without erythema, bulging. No erythema, swelling, or exudate on post pharynx.  Tonsils not swollen or erythematous. Hearing normal.  Respiratory: Respiratory effort normal, BS equal bilaterally without rales, rhonchi, wheezing or stridor.  Cardio: RRR with no MRGs. Brisk peripheral pulses without edema.  Abdomen: Soft, + BS.  Non tender, no guarding, rebound, hernias, masses. Musculoskeletal: Full ROM, 5/5 strength, normal gait.  Skin: Warm, dry without rashes, lesions, ecchymosis.  Neuro: Cranial nerves intact. Normal muscle tone, no cerebellar symptoms. Sensation intact.    Plan and Assessment: Diabetes Education: Reviewed 'ABCs' of diabetes management (respective goals in parentheses):  A1C (ultimate goal <7, immediate goal <8), blood pressure (<130/80), and cholesterol (LDL <70) Eye Exam yearly and Dental Exam every 6 months. Dietary recommendations discussed extensively  - informational handout provided, encouraged to contact through mychart with any questions Start a food diary Follow up in 3 months due to self pay  Physical Activity recommendations - start going to gym 3 days weekly - Strongly advised her to start checking sugars - fasting sugar if she checks - given sugar log and advised how to fill it and to bring it at next appt  - given foot care handout and explained the principles  - given instructions for hypoglycemia management    No future appointments.

## 2017-06-16 ENCOUNTER — Encounter: Payer: Self-pay | Admitting: Adult Health

## 2017-06-16 ENCOUNTER — Ambulatory Visit (INDEPENDENT_AMBULATORY_CARE_PROVIDER_SITE_OTHER): Payer: Self-pay | Admitting: Adult Health

## 2017-06-16 VITALS — BP 110/68 | HR 100 | Temp 97.5°F | Ht 63.0 in | Wt 153.8 lb

## 2017-06-16 DIAGNOSIS — E1165 Type 2 diabetes mellitus with hyperglycemia: Secondary | ICD-10-CM

## 2017-06-16 DIAGNOSIS — R7309 Other abnormal glucose: Secondary | ICD-10-CM

## 2017-06-16 MED ORDER — GABAPENTIN 300 MG PO CAPS: ORAL_CAPSULE | ORAL | 2 refills | Status: DC

## 2017-06-16 NOTE — Patient Instructions (Addendum)
  Please start on a daily baby aspirin  Increase vegetables and fruits (7+ servings daily) Lean protein + salad or soup + vegetable   Water 80-100 fluid ounces daily     Bad carbs also include fruit juice, alcohol, and sweet tea. These are empty calories that do not signal to your brain that you are full.   Please remember the good carbs are still carbs which convert into sugar. So please measure them out no more than 1/2-1 cup of rice, oatmeal, pasta, and beans  Veggies are however free foods! Pile them on.   Not all fruit is created equal. Please see the list below, the fruit at the bottom is higher in sugars than the fruit at the top. Please avoid all dried fruits.

## 2017-06-21 LAB — HM DIABETES EYE EXAM

## 2017-09-29 DIAGNOSIS — E663 Overweight: Secondary | ICD-10-CM | POA: Insufficient documentation

## 2017-09-29 NOTE — Progress Notes (Signed)
FOLLOW UP  Assessment and Plan:   Hypertension Well controlled without medications Monitor blood pressure at home; patient to call if consistently greater than 130/80 Continue DASH diet.   Reminder to go to the ER if any CP, SOB, nausea, dizziness, severe HA, changes vision/speech, left arm numbness and tingling and jaw pain.  Cholesterol Currently above goal; d/c simvastatin 20 mg, will switch to atorvastatin 80 mg starting with 1/2 tab daily Continue low cholesterol diet and exercise.  Check lipid panel.   Diabetes with other diabetic neurologic complication Overall poorly complaint and very poor control Continue medication: doesn't tolerate glipizide, will d/c, currently taking metformin 250 mg once daily only due to SE - encouraged her to increase to BID, she has savings program for farxiga - will order 10 mg with instructions to start with 5 mg daily then increase in 2 weeks Continue diet and exercise.  Perform daily foot/skin check, notify office of any concerning changes.  Check A1C  BMI 27 Long discussion about weight loss, diet, and exercise Recommended diet heavy in fruits and veggies and low in animal meats, cheeses, and dairy products, appropriate calorie intake Discussed ideal weight for height  Will follow up in 3 months  Vitamin D Def Agreeable to starting on cholecalciferol 5000 IU daiy Defer Vit D level  Trochanteric bursitis of left hip Left Hip Bursitis-  Injection- area cleaned with alcohol, Dexamethasone 10mg  and 1 CC lidocaine injected into greater trochanteric, tolerated welll with immediate relief.  Instructed to start on ibuprofen 600 TID, RICE, and exercises given If not better with refer to orthopedics.   Continue diet and meds as discussed. Further disposition pending results of labs. Discussed med's effects and SE's.   Over 30 minutes of exam, counseling, chart review, and critical decision making was performed.   No future  appointments.  ----------------------------------------------------------------------------------------------------------------------  HPI 62 y.o. female  presents for 3 month follow up on hypertension, cholesterol, diabetes, weight and vitamin D deficiency. She is also c/o left hip tenderness and pain, no instigating event. Reports pain is 8/10, sharp, not improving with tyelnol/ibuprofen, heat, ice, rest.   BMI is Body mass index is 27.56 kg/m., she has been working on diet- has cut out sweet tea and sodas, is doing stair climbing and working in the yard for exercise. She is drinking a lot of diet soda, admits to not drinking much water.  Wt Readings from Last 3 Encounters:  09/30/17 155 lb 9.6 oz (70.6 kg)  06/16/17 153 lb 12.8 oz (69.8 kg)  06/02/17 155 lb 3.2 oz (70.4 kg)   Her blood pressure has been controlled at home, today their BP is BP: 118/60  She does not workout. She denies chest pain, shortness of breath, dizziness.   She is on cholesterol medication (simvastatin 20 mg) and denies myalgias. Her cholesterol is not at goal. The cholesterol last visit was:   Lab Results  Component Value Date   CHOL 194 06/02/2017   HDL 28 (L) 06/02/2017   [place]  06/02/2017     Comment:     . LDL cholesterol not calculated. Triglyceride levels greater than 400 mg/dL invalidate calculated LDL results. . Reference range: <100 . Desirable range <100 mg/dL for primary prevention;   <70 mg/dL for patients with CHD or diabetic patients  with > or = 2 CHD risk factors. Marland Kitchen LDL-C is now calculated using the [name]  calculation, which is a validated novel method providing  better accuracy than the Friedewald equation in the  estimation of LDL-C.  [name] et al. [name]. 4034;742(59): 2061-2068  (http://education.[facility].com/faq/FAQ164)    TRIG 484 (H) 06/02/2017   CHOLHDL 6.9 (H) 06/02/2017    She has been working on diet and diabetes, and denies foot ulcerations,  increased appetite, nausea, polydipsia, polyuria, vomiting and weight loss. Last She does not currently take sugars at home.She reports she has been unable to tolerate glipizide and has not taken since first being prescribed. She take 250 mg of metformin only due to SE. She is requesting we discuss farxiga today due to special cost adjustment program she found. A1C in the office was:  Lab Results  Component Value Date   HGBA1C 11.1 (H) 06/02/2017   Patient is not on Vitamin D supplement.   Lab Results  Component Value Date   VD25OH 10 (L) 08/27/2014        Current Medications:  Current Outpatient Medications on File Prior to Visit  Medication Sig  . citalopram (CELEXA) 40 MG tablet Take 1 tablet (40 mg total) by mouth daily.  Marland Kitchen docusate sodium (COLACE) 250 MG capsule Take 1 capsule (250 mg total) by mouth daily. While taking pain medications  . gabapentin (NEURONTIN) 300 MG capsule 1-3 at night for pain  . metFORMIN (GLUMETZA) 500 MG (MOD) 24 hr tablet Take 1 tablet (500 mg total) by mouth daily with breakfast.  . promethazine (PHENERGAN) 25 MG tablet Take 1 tablet (25 mg total) by mouth every 6 (six) hours as needed for nausea or vomiting.  Marland Kitchen QUEtiapine (SEROQUEL) 25 MG tablet Take 1 tablet (25 mg total) by mouth at bedtime.  . simvastatin (ZOCOR) 20 MG tablet Take 1 tablet (20 mg total) by mouth every evening.   No current facility-administered medications on file prior to visit.      Allergies:  Allergies  Allergen Reactions  . Codeine   . Lunesta [Eszopiclone]     Excessive fatigue  . Welchol [Colesevelam Hcl]     nausea      Medical History:  Past Medical History:  Diagnosis Date  . Anxiety   . Cervical dysplasia   . Colon cancer ([place])   . Depression   . Diabetes ([place])   . Hyperlipidemia   . Vitamin D deficiency    Family history- Reviewed and unchanged Social history- Reviewed and unchanged   Review of Systems:  Review of Systems  Constitutional: Negative  for malaise/fatigue and weight loss.  HENT: Negative for hearing loss and tinnitus.   Eyes: Negative for blurred vision and double vision.  Respiratory: Negative for cough, shortness of breath and wheezing.   Cardiovascular: Negative for chest pain, palpitations, orthopnea, claudication and leg swelling.  Gastrointestinal: Negative for abdominal pain, blood in stool, constipation, diarrhea, heartburn, melena, nausea and vomiting.  Genitourinary: Negative.   Musculoskeletal: Positive for joint pain (left hip ). Negative for myalgias.  Skin: Negative for rash.  Neurological: Negative for dizziness, tingling, sensory change, weakness and headaches.  Endo/Heme/Allergies: Negative for polydipsia.  Psychiatric/Behavioral: Negative.   All other systems reviewed and are negative.     Physical Exam: BP 118/60   Pulse 97   Temp 98.6 F (37 C)   Resp 16   Ht 5\' 3"  (1.6 m)   Wt 155 lb 9.6 oz (70.6 kg)   SpO2 98%   BMI 27.56 kg/m  Wt Readings from Last 3 Encounters:  09/30/17 155 lb 9.6 oz (70.6 kg)  06/16/17 153 lb 12.8 oz (69.8 kg)  06/02/17 155 lb 3.2 oz (70.4  kg)   General Appearance: Well nourished, in no apparent distress. Eyes: PERRLA, EOMs, conjunctiva no swelling or erythema Sinuses: No Frontal/maxillary tenderness ENT/Mouth: Ext aud canals clear, TMs without erythema, bulging. No erythema, swelling, or exudate on post pharynx.  Tonsils not swollen or erythematous. Hearing normal.  Neck: Supple, thyroid normal.  Respiratory: Respiratory effort normal, BS equal bilaterally without rales, rhonchi, wheezing or stridor.  Cardio: RRR with no MRGs. Brisk peripheral pulses without edema.  Abdomen: Soft, + BS.  Non tender, no guarding, rebound, hernias, masses. Lymphatics: Non tender without lymphadenopathy.  Musculoskeletal: Full ROM, 5/5 strength, Normal gait, very tender over left greater trochanter Skin: Warm, dry without rashes, lesions, ecchymosis.  Neuro: Cranial nerves intact.  No cerebellar symptoms.  Psych: Awake and oriented X 3, normal affect, Insight and Judgment appropriate.    Izora Ribas, NP 12:47 PM [facility]

## 2017-09-30 ENCOUNTER — Encounter: Payer: Self-pay | Admitting: Adult Health

## 2017-09-30 ENCOUNTER — Ambulatory Visit (INDEPENDENT_AMBULATORY_CARE_PROVIDER_SITE_OTHER): Payer: Self-pay | Admitting: Adult Health

## 2017-09-30 VITALS — BP 118/60 | HR 97 | Temp 98.6°F | Resp 16 | Ht 63.0 in | Wt 155.6 lb

## 2017-09-30 DIAGNOSIS — E663 Overweight: Secondary | ICD-10-CM

## 2017-09-30 DIAGNOSIS — D72829 Elevated white blood cell count, unspecified: Secondary | ICD-10-CM

## 2017-09-30 DIAGNOSIS — E119 Type 2 diabetes mellitus without complications: Secondary | ICD-10-CM

## 2017-09-30 DIAGNOSIS — E782 Mixed hyperlipidemia: Secondary | ICD-10-CM

## 2017-09-30 DIAGNOSIS — E559 Vitamin D deficiency, unspecified: Secondary | ICD-10-CM

## 2017-09-30 DIAGNOSIS — E785 Hyperlipidemia, unspecified: Secondary | ICD-10-CM

## 2017-09-30 DIAGNOSIS — F32A Depression, unspecified: Secondary | ICD-10-CM

## 2017-09-30 DIAGNOSIS — Z794 Long term (current) use of insulin: Secondary | ICD-10-CM

## 2017-09-30 DIAGNOSIS — M7062 Trochanteric bursitis, left hip: Secondary | ICD-10-CM

## 2017-09-30 DIAGNOSIS — F329 Major depressive disorder, single episode, unspecified: Secondary | ICD-10-CM

## 2017-09-30 LAB — CBC WITH DIFFERENTIAL/PLATELET
Basophils Absolute: 121 cells/uL (ref 0–200)
Basophils Relative: 0.8 %
EOS PCT: 1.5 %
Eosinophils Absolute: 227 cells/uL (ref 15–500)
HCT: 39.4 % (ref 35.0–45.0)
Hemoglobin: 12.4 g/dL (ref 11.7–15.5)
Lymphs Abs: 4560 cells/uL — ABNORMAL HIGH (ref 850–3900)
MCH: 19 pg — ABNORMAL LOW (ref 27.0–33.0)
MCHC: 31.5 g/dL — AB (ref 32.0–36.0)
MCV: 60.2 fL — ABNORMAL LOW (ref 80.0–100.0)
MONOS PCT: 3.8 %
MPV: 10.2 fL (ref 7.5–12.5)
Neutro Abs: 9619 cells/uL — ABNORMAL HIGH (ref 1500–7800)
Neutrophils Relative %: 63.7 %
PLATELETS: 445 10*3/uL — AB (ref 140–400)
RBC: 6.54 10*6/uL — AB (ref 3.80–5.10)
RDW: 18.3 % — AB (ref 11.0–15.0)
TOTAL LYMPHOCYTE: 30.2 %
WBC mixed population: 574 cells/uL (ref 200–950)
WBC: 15.1 10*3/uL — AB (ref 3.8–10.8)

## 2017-09-30 LAB — COMPLETE METABOLIC PANEL WITH GFR
AG Ratio: 1.8 (calc) (ref 1.0–2.5)
ALKALINE PHOSPHATASE (APISO): 96 U/L (ref 33–130)
ALT: 11 U/L (ref 6–29)
AST: 12 U/L (ref 10–35)
Albumin: 4.6 g/dL (ref 3.6–5.1)
BUN: 11 mg/dL (ref 7–25)
CALCIUM: 9.6 mg/dL (ref 8.6–10.4)
CO2: 26 mmol/L (ref 20–32)
CREATININE: 0.69 mg/dL (ref 0.50–0.99)
Chloride: 99 mmol/L (ref 98–110)
GFR, EST NON AFRICAN AMERICAN: 93 mL/min/{1.73_m2} (ref >=60)
GFR, Est African American: 107 mL/min/{1.73_m2} (ref >=60)
GLUCOSE: 313 mg/dL — AB (ref 65–99)
Globulin: 2.6 g/dL (calc) (ref 1.9–3.7)
Potassium: 4.6 mmol/L (ref 3.5–5.3)
Sodium: 136 mmol/L (ref 135–146)
Total Bilirubin: 0.6 mg/dL (ref 0.2–1.2)
Total Protein: 7.2 g/dL (ref 6.1–8.1)

## 2017-09-30 LAB — LIPID PANEL
CHOL/HDL RATIO: 5.5 (calc) — AB (ref <5.0)
CHOLESTEROL: 166 mg/dL (ref <200)
HDL: 30 mg/dL — ABNORMAL LOW (ref >50)
LDL Cholesterol (Calc): 95 mg/dL (calc)
Non-HDL Cholesterol (Calc): 136 mg/dL (calc) — ABNORMAL HIGH (ref <130)
TRIGLYCERIDES: 310 mg/dL — AB (ref <150)

## 2017-09-30 LAB — CBC MORPHOLOGY

## 2017-09-30 LAB — TSH: TSH: 2.14 mIU/L (ref 0.40–4.50)

## 2017-09-30 MED ORDER — DAPAGLIFLOZIN PROPANEDIOL 10 MG PO TABS: 10.0000 mg | ORAL_TABLET | Freq: Every day | ORAL | 1 refills | Status: DC

## 2017-09-30 MED ORDER — ATORVASTATIN CALCIUM 80 MG PO TABS: ORAL_TABLET | ORAL | 1 refills | Status: DC

## 2017-09-30 NOTE — Patient Instructions (Addendum)
Aim to increase water intake - try to alternate water between diet soda Try water with lemon/fruit with stevia  Aim for 80 fluid ounces of water or unsweet tea   Omega 3 supplement daily for triglycerides   Start on a vitamin D supplement 5000 IU daily  Start farxiga at 5 mg daily for first week, then increase to 10 mg if tolerating    Trochanteric Bursitis Trochanteric bursitis is a condition that causes hip pain. Trochanteric bursitis happens when fluid-filled sacs (bursae) in the hip get irritated. Normally these sacs absorb shock and help strong bands of tissue (tendons) in your hip glide smoothly over each other and over your hip bones. What are the causes? This condition results from increased friction between the hip bones and the tendons that go over them. This condition can happen if you:  Have weak hips.  Use your hip muscles too much (overuse).  Get hit in the hip.  What increases the risk? This condition is more likely to develop in:  Women.  Adults who are middle-aged or older.  People with arthritis or a spinal condition.  People with weak buttocks muscles (gluteal muscles).  People who have one leg that is shorter than the other.  People who participate in certain kinds of athletic activities, such as: ? Running sports, especially long-distance running. ? Contact sports, like football or martial arts. ? Sports in which falls may occur, like skiing.  What are the signs or symptoms? The main symptom of this condition is pain and tenderness over the point of your hip. The pain may be:  Sharp and intense.  Dull and achy.  Felt on the outside of your thigh.  It may increase when you:  Lie on your side.  Walk or run.  Go up on stairs.  Sit.  Stand up after sitting.  Stand for long periods of time.  How is this diagnosed? This condition may be diagnosed based on:  Your symptoms.  Your medical history.  A physical exam.  Imaging tests,  such as: ? X-rays to check your bones. ? An MRI or ultrasound to check your tendons and muscles.  During your physical exam, your health care provider will check the movement and strength of your hip. He or she may press on the point of your hip to check for pain. How is this treated? This condition may be treated by:  Resting.  Reducing your activity.  Avoiding activities that cause pain.  Using crutches, a cane, or a walker to decrease the strain on your hip.  Taking medicine to help with swelling.  Having medicine injected into the bursae to help with swelling.  Using ice, heat, and massage therapy for pain relief.  Physical therapy exercises for strength and flexibility.  Surgery (rare).  Follow these instructions at home: Activity  Rest.  Avoid activities that cause pain.  Return to your normal activities as told by your health care provider. Ask your health care provider what activities are safe for you. Managing pain, stiffness, and swelling  Take over-the-counter and prescription medicines only as told by your health care provider.  If directed, apply heat to the injured area as told by your health care provider. ? Place a towel between your skin and the heat source. ? Leave the heat on for 20-30 minutes. ? Remove the heat if your skin turns bright red. This is especially important if you are unable to feel pain, heat, or cold. You may have a  greater risk of getting burned.  If directed, apply ice to the injured area: ? Put ice in a plastic bag. ? Place a towel between your skin and the bag. ? Leave the ice on for 20 minutes, 2-3 times a day. General instructions  If the affected leg is one that you use for driving, ask your health care provider when it is safe to drive.  Use crutches, a cane, or a walker as told by your health care provider.  If one of your legs is shorter than the other, get fitted for a shoe insert.  Lose weight if you are  overweight. How is this prevented?  Wear supportive footwear that is appropriate for your sport.  If you have hip pain, start any new exercise or sport slowly.  Maintain physical fitness, including: ? Strength. ? Flexibility. Contact a health care provider if:  Your pain does not improve with 2-4 weeks. Get help right away if:  You develop severe pain.  You have a fever.  You develop increased redness over your hip.  You have a change in your bowel function or bladder function.  You cannot control the muscles in your feet. This information is not intended to replace advice given to you by your health care provider. Make sure you discuss any questions you have with your health care provider. Document Released: 06/24/2004 Document Revised: 01/21/2016 Document Reviewed: 05/02/2015 Elsevier Interactive Patient Education  2018 [employer].    Dapagliflozin tablets What is this medicine? DAPAGLIFLOZIN (DAP a gli FLOE zin) helps to treat type 2 diabetes. It helps to control blood sugar. Treatment is combined with diet and exercise. This medicine may be used for other purposes; ask your health care provider or pharmacist if you have questions. COMMON BRAND NAME(S): Wilder Glade What should I tell my health care provider before I take this medicine? They need to know if you have any of these conditions: -bladder cancer -dehydration -diabetic ketoacidosis -diet low in salt -eating less due to illness, surgery, dieting, or any other reason -having surgery -high cholesterol -history of pancreatitis or pancreas problems -history of yeast infection of the penis or vagina -if you often drink alcohol -infections in the bladder, kidneys, or urinary tract -kidney disease -low blood pressure -on hemodialysis -problems urinating -type 1 diabetes -uncircumcised female -an unusual or allergic reaction to dapagliflozin, other medicines, foods, dyes, or preservatives -pregnant or trying to  get pregnant -breast-feeding How should I use this medicine? Take this medicine by mouth with a glass of water. Follow the directions on the prescription label. You can take it with or without food. If it upsets your stomach, take it with food. Take this medicine in the morning. Take your dose at the same time each day. Do not take more often than directed. Do not stop taking except on your doctor's advice. A special MedGuide will be given to you by the pharmacist with each prescription and refill. Be sure to read this information carefully each time. Talk to your pediatrician regarding the use of this medicine in children. Special care may be needed. Overdosage: If you think you have taken too much of this medicine contact a poison control center or emergency room at once. NOTE: This medicine is only for you. Do not share this medicine with others. What if I miss a dose? If you miss a dose, take it as soon as you can. If it is almost time for your next dose, take only that dose. Do not take  double or extra doses. What may interact with this medicine? Do not take this medicine with any of the following medications: -gatifloxacin This medicine may also interact with the following medications: -alcohol -certain medicines for blood pressure, heart disease -diuretics -insulin -nateglinide -pioglitazone -quinolone antibiotics like ciprofloxacin, levofloxacin, ofloxacin -repaglinide -some herbal dietary supplements -steroid medicines like prednisone or cortisone -sulfonylureas like glimepiride, glipizide, glyburide -thyroid medicine This list may not describe all possible interactions. Give your health care provider a list of all the medicines, herbs, non-prescription drugs, or dietary supplements you use. Also tell them if you smoke, drink alcohol, or use illegal drugs. Some items may interact with your medicine. What should I watch for while using this medicine? Visit your doctor or health  care professional for regular checks on your progress. This medicine can cause a serious condition in which there is too much acid in the blood. If you develop nausea, vomiting, stomach pain, unusual tiredness, or breathing problems, stop taking this medicine and call your doctor right away. If possible, use a ketone dipstick to check for ketones in your urine. A test called the HbA1C (A1C) will be monitored. This is a simple blood test. It measures your blood sugar control over the last 2 to 3 months. You will receive this test every 3 to 6 months. Learn how to check your blood sugar. Learn the symptoms of low and high blood sugar and how to manage them. Always carry a quick-source of sugar with you in case you have symptoms of low blood sugar. Examples include hard sugar candy or glucose tablets. Make sure others know that you can choke if you eat or drink when you develop serious symptoms of low blood sugar, such as seizures or unconsciousness. They must get medical help at once. Tell your doctor or health care professional if you have high blood sugar. You might need to change the dose of your medicine. If you are sick or exercising more than usual, you might need to change the dose of your medicine. Do not skip meals. Ask your doctor or health care professional if you should avoid alcohol. Many nonprescription cough and cold products contain sugar or alcohol. These can affect blood sugar. Wear a medical ID bracelet or chain, and carry a card that describes your disease and details of your medicine and dosage times. What side effects may I notice from receiving this medicine? Side effects that you should report to your doctor or health care professional as soon as possible: -allergic reactions like skin rash, itching or hives, swelling of the face, lips, or tongue -breathing problems -dizziness -feeling faint or lightheaded, falls -muscle weakness -nausea, vomiting, unusual stomach upset or  pain -signs and symptoms of low blood sugar such as feeling anxious, confusion, dizziness, increased hunger, unusually weak or tired, sweating, shakiness, cold, irritable, headache, blurred vision, fast heartbeat, loss of consciousness -signs and symptoms of a urinary tract infection, such as fever, chills, a burning feeling when urinating, blood in the urine, back pain -trouble passing urine or change in the amount of urine, including an urgent need to urinate more often, in larger amounts, or at night -penile discharge, itching, or pain in men -unusual tiredness -vaginal discharge, itching, or odor in women Side effects that usually do not require medical attention (report to your doctor or health care professional if they continue or are bothersome): -constipation -mild increase in urination -stuffy or runny nose -sore throat -thirsty This list may not describe all possible side effects.  Call your doctor for medical advice about side effects. You may report side effects to FDA at 1-800-FDA-1088. Where should I keep my medicine? Keep out of the reach of children. Store at room temperature between 15 and 30 degrees C (59 and 86 degrees F). Throw away any unused medicine after the expiration date. NOTE: This sheet is a summary. It may not cover all possible information. If you have questions about this medicine, talk to your doctor, pharmacist, or health care provider.  2018 Elsevier/Gold Standard (2015-06-19 08:46:40)

## 2017-10-01 LAB — HEMOGLOBIN A1C
Hgb A1c MFr Bld: 10.5 % of total Hgb — ABNORMAL HIGH (ref <5.7)
MEAN PLASMA GLUCOSE: 255 (calc)
eAG (mmol/L): 14.1 (calc)

## 2017-10-02 ENCOUNTER — Encounter: Payer: Self-pay | Admitting: Adult Health

## 2017-10-02 ENCOUNTER — Other Ambulatory Visit: Payer: Self-pay | Admitting: Adult Health

## 2017-10-03 ENCOUNTER — Encounter: Payer: Self-pay | Admitting: Adult Health

## 2017-10-03 ENCOUNTER — Other Ambulatory Visit: Payer: Self-pay | Admitting: Adult Health

## 2017-10-03 DIAGNOSIS — R799 Abnormal finding of blood chemistry, unspecified: Secondary | ICD-10-CM

## 2017-10-05 ENCOUNTER — Encounter (INDEPENDENT_AMBULATORY_CARE_PROVIDER_SITE_OTHER): Payer: Self-pay

## 2017-10-05 ENCOUNTER — Encounter: Payer: Self-pay | Admitting: Adult Health

## 2017-11-03 ENCOUNTER — Inpatient Hospital Stay: Payer: Self-pay

## 2017-11-03 ENCOUNTER — Inpatient Hospital Stay: Payer: Self-pay | Attending: Oncology | Admitting: Oncology

## 2017-11-03 VITALS — BP 124/57 | HR 97 | Temp 98.1°F | Resp 17 | Ht 63.0 in | Wt 154.2 lb

## 2017-11-03 DIAGNOSIS — D72828 Other elevated white blood cell count: Secondary | ICD-10-CM | POA: Insufficient documentation

## 2017-11-03 DIAGNOSIS — R161 Splenomegaly, not elsewhere classified: Secondary | ICD-10-CM | POA: Insufficient documentation

## 2017-11-03 DIAGNOSIS — D509 Iron deficiency anemia, unspecified: Secondary | ICD-10-CM | POA: Insufficient documentation

## 2017-11-03 DIAGNOSIS — Z9221 Personal history of antineoplastic chemotherapy: Secondary | ICD-10-CM | POA: Insufficient documentation

## 2017-11-03 DIAGNOSIS — R718 Other abnormality of red blood cells: Secondary | ICD-10-CM | POA: Insufficient documentation

## 2017-11-03 DIAGNOSIS — Z7984 Long term (current) use of oral hypoglycemic drugs: Secondary | ICD-10-CM | POA: Insufficient documentation

## 2017-11-03 DIAGNOSIS — R109 Unspecified abdominal pain: Secondary | ICD-10-CM | POA: Insufficient documentation

## 2017-11-03 DIAGNOSIS — R634 Abnormal weight loss: Secondary | ICD-10-CM | POA: Insufficient documentation

## 2017-11-03 DIAGNOSIS — C189 Malignant neoplasm of colon, unspecified: Secondary | ICD-10-CM

## 2017-11-03 DIAGNOSIS — Z79899 Other long term (current) drug therapy: Secondary | ICD-10-CM | POA: Insufficient documentation

## 2017-11-03 DIAGNOSIS — Z85048 Personal history of other malignant neoplasm of rectum, rectosigmoid junction, and anus: Secondary | ICD-10-CM | POA: Insufficient documentation

## 2017-11-03 LAB — CBC WITH DIFFERENTIAL (CANCER CENTER ONLY)
BASOS ABS: 0.1 10*3/uL (ref 0.0–0.1)
Basophils Relative: 0 %
EOS ABS: 0.3 10*3/uL (ref 0.0–0.5)
EOS PCT: 2 %
HCT: 37.3 % (ref 34.8–46.6)
Hemoglobin: 11.8 g/dL (ref 11.6–15.9)
Lymphocytes Relative: 34 %
Lymphs Abs: 4.9 10*3/uL — ABNORMAL HIGH (ref 0.9–3.3)
MCH: 19.5 pg — ABNORMAL LOW (ref 25.1–34.0)
MCHC: 31.6 g/dL (ref 31.5–36.0)
MCV: 61.7 fL — ABNORMAL LOW (ref 79.5–101.0)
MONO ABS: 0.5 10*3/uL (ref 0.1–0.9)
MONOS PCT: 3 %
NEUTROS ABS: 8.8 10*3/uL — AB (ref 1.5–6.5)
Neutrophils Relative %: 61 %
PLATELETS: 350 10*3/uL (ref 145–400)
RBC: 6.05 MIL/uL — ABNORMAL HIGH (ref 3.70–5.45)
RDW: 16.6 % — AB (ref 11.2–14.5)
WBC Count: 14.4 10*3/uL — ABNORMAL HIGH (ref 3.9–10.3)

## 2017-11-03 LAB — SAVE SMEAR

## 2017-11-03 NOTE — Progress Notes (Signed)
Hematology and Oncology Follow Up Visit  Dorothy Boyd [id number] [birth year] 62 y.o. 11/03/2017 2:25 PM Dorothy Boyd, MDMcKeown, Dorothy Saxon, MD   Principle Diagnosis: 62 year old with:  1. Stage II (T3, N0, M0) adenocarcinoma, sigmoid colon. She was diagnosed in 2010. 2.  Leukocytosis and microcytosis documented since 2010.  Etiology remains unclear with possible myeloproliferative disorder.  Prior Therapy:  She is status post a rectosigmoid resection which showed invasive moderately differentiated adenocarcinoma spanning 6 cm. 0/16 lymph nodes sampled none had  any evidence of cancer. This was done in August of 2010. She was treated with adjuvant FOLFOX chemotherapy therapy completed in 2011 under the care of Dr. Marin Olp.   Current therapy: Active surveillance.  Interim History: [name] presents today for a follow-up visit.  He is a pleasant woman I saw in the past with the evaluation of the leukocytosis as well as history of colon cancer.  She has not had any treatment related to her colon cancer and has been in remission since 2011.  She remains up-to-date on her colonoscopy.  Her white cell count elevation has been chronic in nature with unclear etiology.  She continues to smoke which could be contributing to her leukocytosis.  She is reporting recent complaints of weight loss abdominal pain and increased fatigue.  She denies any active bleeding or easy bruising.  She denies any change in her bowel habits including hematochezia or melena.  She does not report any headaches, blurry vision, syncope or seizures. Does not report any fevers, chills or sweats.  Does not report any cough, wheezing or hemoptysis.  Does not report any chest pain, palpitation, orthopnea or leg edema.  Does not report any nausea, vomiting or abdominal pain.  Does not report any constipation or diarrhea.  Does not report any skeletal complaints.    Does not report frequency, urgency or hematuria.  Does not report any  skin rashes or lesions. Does not report any heat or cold intolerance.  Does not report any lymphadenopathy or petechiae.  Does not report any anxiety or depression.  Remaining review of systems is negative.     Medications: I have reviewed the patient's current medications.  Current Outpatient Medications  Medication Sig Dispense Refill  . atorvastatin (LIPITOR) 80 MG tablet Take 1/2 to 1 tablet daily for Cholesterol or as directed instead of simvastatin 90 tablet 1  . citalopram (CELEXA) 40 MG tablet Take 1 tablet (40 mg total) by mouth daily. 90 tablet 1  . docusate sodium (COLACE) 250 MG capsule Take 1 capsule (250 mg total) by mouth daily. While taking pain medications 10 capsule 0  . gabapentin (NEURONTIN) 300 MG capsule 1-3 at night for pain 90 capsule 2  . metFORMIN (GLUMETZA) 500 MG (MOD) 24 hr tablet Take 1 tablet (500 mg total) by mouth daily with breakfast. 90 tablet 1  . promethazine (PHENERGAN) 25 MG tablet Take 1 tablet (25 mg total) by mouth every 6 (six) hours as needed for nausea or vomiting. 30 tablet 3  . QUEtiapine (SEROQUEL) 25 MG tablet Take 1 tablet (25 mg total) by mouth at bedtime. 90 tablet 0   No current facility-administered medications for this visit.      Allergies:  Allergies  Allergen Reactions  . Codeine   . Glipizide Nausea And Vomiting  . Lunesta [Eszopiclone]     Excessive fatigue  . Welchol [Colesevelam Hcl]     nausea     Past Medical History, Surgical history, Social history, and Family History  were reviewed and updated.   Physical Exam: Blood pressure (!) 124/57, pulse 97, temperature 98.1 F (36.7 C), temperature source Oral, resp. rate 17, height _0  (1.6 m), weight 154 lb 3.2 oz (69.9 kg), SpO2 97 %. ECOG: 1 General appearance: Comfortable appearing woman without distress. Head: Normocephalic, atraumatic. Eyes: No scleral icterus. Oropharynx: Without any thrush or ulcers. Lymph nodes: No lymphadenopathy noted in the cervical,  supraclavicular, and axillary nodes  Heart:regular rate and rhythm, S1, S2 normal, no murmur, click, rub or gallop Lung: Clear in all lung fields without any rhonchi, wheezes or dullness to percussion. Abdomin: Soft nontender without any rebound or guarding.  Could not appreciate splenomegaly. Musculoskeletal: No joint deformity or effusion.   Lab Results: Lab Results  Component Value Date   WBC 15.1 (H) 09/30/2017   HGB 12.4 09/30/2017   HCT 39.4 09/30/2017   MCV 60.2 (L) 09/30/2017   PLT 445 (H) 09/30/2017     Chemistry      Component Value Date/Time   NA 136 09/30/2017 1312   NA 136 09/21/2013 1315   K 4.6 09/30/2017 1312   K 3.9 09/21/2013 1315   CL 99 09/30/2017 1312   CO2 26 09/30/2017 1312   CO2 22 09/21/2013 1315   BUN 11 09/30/2017 1312   BUN 16.3 09/21/2013 1315   CREATININE 0.69 09/30/2017 1312   CREATININE 0.8 09/21/2013 1315      Component Value Date/Time   CALCIUM 9.6 09/30/2017 1312   CALCIUM 9.9 09/21/2013 1315   ALKPHOS 85 12/03/2016 2340   ALKPHOS 96 09/21/2013 1315   AST 12 09/30/2017 1312   AST 13 09/21/2013 1315   ALT 11 09/30/2017 1312   ALT 14 09/21/2013 1315   BILITOT 0.6 09/30/2017 1312   BILITOT 0.48 09/21/2013 1315        Impression and Plan:  62 year old woman woman with:  1. Leukocytosis: This is chronic in nature dating back to at least 2005.  She had a bone marrow biopsy at that time which was unremarkable.  The differential diagnosis was reviewed again which includes reactive leukocytosis related to smoking, myeloproliferative disorder such as myelofibrosis or essential thrombocythemia among others.  For complete evaluation, I will repeat laboratory testing today including CBC and a peripheral smear as well as obtaining molecular testing to screen for myeloproliferative disorder.  We have also discussed the possibility of repeating a bone marrow biopsy if these findings suggest a myeloproliferative disorder  2.  Microcytosis: Could be  related to mild proliferative disorder or iron deficiency.  We will check iron studies today to complete the work-up.  3.  Weight loss and abdominal pain: Splenomegaly causing abdominal pain and weight loss is a possibility in the setting of a myeloproliferative disorder and repeat imaging studies will be needed given her history of colon cancer as well.  4. Stage II adenocarcinoma of the sigmoid colon.  Status post surgical resection followed by adjuvant chemotherapy.  She remains disease-free at this time and up-to-date on colonoscopy.  I doubt her recent symptoms are related to colon cancer recurrence but evaluation will be needed.   5. Followup: Will be in in the next few weeks to follow her progress depending on results of the blood work and CT scan.  25  minutes was spent with the patient face-to-face today.  More than 50% of time was dedicated to patient counseling, education and consent differential diagnosis of her abnormal laboratory findings and future plan of care.     Zola Button,  MD 6/6/20192:25 PM

## 2017-11-04 LAB — IRON AND TIBC
IRON: 86 ug/dL (ref 41–142)
Saturation Ratios: 26 % (ref 21–57)
TIBC: 334 ug/dL (ref 236–444)
UIBC: 248 ug/dL

## 2017-11-04 LAB — FERRITIN: Ferritin: 130 ng/mL (ref 9–269)

## 2017-11-09 ENCOUNTER — Encounter: Payer: Self-pay | Admitting: Oncology

## 2017-11-15 LAB — JAK2 (INCLUDING V617F AND EXON 12), MPL,& CALR-NEXT GEN SEQ

## 2017-12-08 ENCOUNTER — Encounter: Payer: Self-pay | Admitting: Oncology

## 2017-12-09 ENCOUNTER — Telehealth: Payer: Self-pay

## 2017-12-09 ENCOUNTER — Other Ambulatory Visit: Payer: Self-pay

## 2017-12-09 DIAGNOSIS — D509 Iron deficiency anemia, unspecified: Secondary | ICD-10-CM

## 2017-12-09 DIAGNOSIS — C189 Malignant neoplasm of colon, unspecified: Secondary | ICD-10-CM

## 2017-12-09 NOTE — Telephone Encounter (Signed)
Received in-basket response from Lenise, [employer], that pt does not have any aid options from the [facility], but can reach out to [place] at [phone] to discuss her options. Called pt to share this information. Confirmed that pt does not have insurance at this time. Pt had not yet listened VM regarding new appointments. Shared information about medications, NPO status, oral contrast, and lab/CT appointments on 7/18. Pt verbalized understanding of all information and performed readback of customer service number. Pt encouraged to call back with any questions.

## 2017-12-09 NOTE — Telephone Encounter (Signed)
Leave VM on pt phone to notify of 7/18 appts and instructions. Pt to come in to get oral contrast from [facility] scheduling department. Drink one at 11am on the 18th and one at 12pm. Pt should remain NPO after 9am, and should hold metformin that morning.  Message to [place] (cc: Marita [place], Elliot Gault, and [place]) regarding insurance authorization. No insurance documentation noted in pt chart at this time. Melvenia Beam in [employer] marked insurance as self-pay unless otherwise notified.

## 2017-12-09 NOTE — Telephone Encounter (Signed)
Pt sent message via MyChart wondering if [name] still wanted to have MRI performed. Per MD, CT A/P ordered, but not scheduled. Spoke with pt over the phone and she was unsure why this needed to be done. Per MD, CT purpose is to evaluate spleen. This has been discussed with the pt at prior OV. Pt okay to proceed, and voiced preference for afternoon appt.   Pt scheduled for 7/18 lab at 1200 and CT scan at 1300. Order placed for CMP.

## 2017-12-09 NOTE — Telephone Encounter (Signed)
In-basket sent to [place] with Financial Assistance to determine if there is any aid that can be provided for the pt in regards to her CT scan. Confirmed by Elliot Gault, [employer] Authorization, that pt is self-pay with no insurance at this time. Amelia Jo, RN cc'ed.

## 2017-12-15 ENCOUNTER — Ambulatory Visit (HOSPITAL_COMMUNITY)
Admission: RE | Admit: 2017-12-15 | Discharge: 2017-12-15 | Disposition: A | Discharge status: Home / Self Care | Payer: Self-pay | Source: Ambulatory Visit | Attending: Oncology | Admitting: Oncology

## 2017-12-15 ENCOUNTER — Inpatient Hospital Stay: Payer: Self-pay | Attending: Oncology

## 2017-12-15 DIAGNOSIS — C189 Malignant neoplasm of colon, unspecified: Secondary | ICD-10-CM | POA: Insufficient documentation

## 2017-12-15 DIAGNOSIS — D72829 Elevated white blood cell count, unspecified: Secondary | ICD-10-CM | POA: Insufficient documentation

## 2017-12-15 DIAGNOSIS — D509 Iron deficiency anemia, unspecified: Secondary | ICD-10-CM

## 2017-12-15 DIAGNOSIS — D3502 Benign neoplasm of left adrenal gland: Secondary | ICD-10-CM | POA: Insufficient documentation

## 2017-12-15 LAB — CMP (CANCER CENTER ONLY)
ALT: 17 U/L (ref 0–44)
ANION GAP: 11 (ref 5–15)
AST: 15 U/L (ref 15–41)
Albumin: 4 g/dL (ref 3.5–5.0)
Alkaline Phosphatase: 96 U/L (ref 38–126)
BUN: 12 mg/dL (ref 8–23)
CHLORIDE: 99 mmol/L (ref 98–111)
CO2: 26 mmol/L (ref 22–32)
Calcium: 10 mg/dL (ref 8.9–10.3)
Creatinine: 0.96 mg/dL (ref 0.44–1.00)
GFR, Est AFR Am: >60 mL/min (ref >60)
GFR, Estimated: >60 mL/min (ref >60)
GLUCOSE: 270 mg/dL — AB (ref 70–99)
Potassium: 4.7 mmol/L (ref 3.5–5.1)
SODIUM: 136 mmol/L (ref 135–145)
TOTAL PROTEIN: 7.5 g/dL (ref 6.5–8.1)
Total Bilirubin: 0.5 mg/dL (ref 0.3–1.2)

## 2017-12-15 MED ORDER — IOPAMIDOL (ISOVUE-300) INJECTION 61%
100.0000 mL | Freq: Once | INTRAVENOUS | Status: AC | PRN
  Administered 2017-12-15: 100 mL via INTRAVENOUS

## 2017-12-15 MED ORDER — IOPAMIDOL (ISOVUE-300) INJECTION 61%
INTRAVENOUS | Status: AC
  Filled 2017-12-15: qty 100

## 2017-12-16 ENCOUNTER — Telehealth: Payer: Self-pay | Admitting: *Deleted

## 2017-12-16 NOTE — Telephone Encounter (Signed)
-----   Message from Wyatt Portela, MD sent at 12/15/2017  5:00 PM EDT ----- Please let her know her scan is normal. Spleen is normal.

## 2017-12-16 NOTE — Telephone Encounter (Signed)
Spoke with patient. Per dr Alen Blew, her scan is normal and her spleen is normal

## 2017-12-28 ENCOUNTER — Inpatient Hospital Stay: Payer: Self-pay | Admitting: Oncology

## 2017-12-28 ENCOUNTER — Telehealth: Payer: Self-pay | Admitting: Oncology

## 2017-12-28 NOTE — Telephone Encounter (Signed)
Patient called in to cancel °

## 2018-01-03 NOTE — Progress Notes (Deleted)
FOLLOW UP  Assessment and Plan:   Hypertension Well controlled without medications Monitor blood pressure at home; patient to call if consistently greater than 130/80 Continue DASH diet.   Reminder to go to the ER if any CP, SOB, nausea, dizziness, severe HA, changes vision/speech, left arm numbness and tingling and jaw pain.  Cholesterol Currently above goal; d/c simvastatin 20 mg, will switch to atorvastatin 80 mg starting with 1/2 tab daily Continue low cholesterol diet and exercise.  Check lipid panel.   Diabetes with other diabetic neurologic complication Overall poorly complaint and very poor control Continue medication:  currently taking metformin ***  Continue diet and exercise.  Perform daily foot/skin check, notify office of any concerning changes.  Check A1C  BMI 27 Long discussion about weight loss, diet, and exercise Recommended diet heavy in fruits and veggies and low in animal meats, cheeses, and dairy products, appropriate calorie intake Discussed ideal weight for height  Will follow up in 3 months  Vitamin D Def Agreeable to starting on cholecalciferol 5000 IU daily Defer Vit D level  Continue diet and meds as discussed. Further disposition pending results of labs. Discussed med's effects and SE's.   Over 30 minutes of exam, counseling, chart review, and critical decision making was performed.   Future Appointments  Date Time Provider [place]  01/04/2018  3:45 PM Liane Comber, NP [facility] None  01/18/2018  1:45 PM Wyatt Portela, MD [facility] None    ----------------------------------------------------------------------------------------------------------------------  HPI 62 y.o. female  presents for 3 month follow up on hypertension, cholesterol, diabetes, weight and vitamin D deficiency.   BMI is There is no height or weight on file to calculate BMI., she has been working on diet- has cut out sweet tea and sodas, is doing stair  climbing and working in the yard for exercise. She is drinking a lot of diet soda, admits to not drinking much water.  Wt Readings from Last 3 Encounters:  11/03/17 154 lb 3.2 oz (69.9 kg)  09/30/17 155 lb 9.6 oz (70.6 kg)  06/16/17 153 lb 12.8 oz (69.8 kg)   Her blood pressure has been controlled at home, today their BP is    She does not workout. She denies chest pain, shortness of breath, dizziness.   She is on cholesterol medication (changed from simvastatin to atorvastatin *** at last visit) and denies myalgias. Her cholesterol is not at goal. The cholesterol last visit was:   Lab Results  Component Value Date   CHOL 166 09/30/2017   HDL 30 (L) 09/30/2017   LDLCALC 95 09/30/2017   TRIG 310 (H) 09/30/2017   CHOLHDL 5.5 (H) 09/30/2017    She has been working on diet and diabetes, and denies foot ulcerations, increased appetite, nausea, polydipsia, polyuria, vomiting and weight loss. She does not currently take sugars at home ***. She has reported intolerance to multiple oral agents and has been poorly compliant with therapy, and is poorly compliant with dietary recommendations as well. Consequently A1Cs have been very poorly controlled. Current prescribed metformin which she takes ***.  Last A1C in the office was:  Lab Results  Component Value Date   HGBA1C 10.5 (H) 09/30/2017   Patient is not on Vitamin D supplement.   Lab Results  Component Value Date   VD25OH 10 (L) 08/27/2014        Current Medications:  Current Outpatient Medications on File Prior to Visit  Medication Sig  . atorvastatin (LIPITOR) 80 MG tablet Take 1/2 to 1  tablet daily for Cholesterol or as directed instead of simvastatin  . citalopram (CELEXA) 40 MG tablet Take 1 tablet (40 mg total) by mouth daily.  Marland Kitchen docusate sodium (COLACE) 250 MG capsule Take 1 capsule (250 mg total) by mouth daily. While taking pain medications  . gabapentin (NEURONTIN) 300 MG capsule 1-3 at night for pain  . metFORMIN (GLUMETZA)  500 MG (MOD) 24 hr tablet Take 1 tablet (500 mg total) by mouth daily with breakfast.  . promethazine (PHENERGAN) 25 MG tablet Take 1 tablet (25 mg total) by mouth every 6 (six) hours as needed for nausea or vomiting.  Marland Kitchen QUEtiapine (SEROQUEL) 25 MG tablet Take 1 tablet (25 mg total) by mouth at bedtime.   No current facility-administered medications on file prior to visit.      Allergies:  Allergies  Allergen Reactions  . Codeine   . Glipizide Nausea And Vomiting  . Lunesta [Eszopiclone]     Excessive fatigue  . Welchol [Colesevelam Hcl]     nausea      Medical History:  Past Medical History:  Diagnosis Date  . Anxiety   . Cervical dysplasia   . Colon cancer ([place])   . Depression   . Diabetes ([place])   . Hyperlipidemia   . Vitamin D deficiency    Family history- Reviewed and unchanged Social history- Reviewed and unchanged   Review of Systems:  Review of Systems  Constitutional: Negative for malaise/fatigue and weight loss.  HENT: Negative for hearing loss and tinnitus.   Eyes: Negative for blurred vision and double vision.  Respiratory: Negative for cough, shortness of breath and wheezing.   Cardiovascular: Negative for chest pain, palpitations, orthopnea, claudication and leg swelling.  Gastrointestinal: Negative for abdominal pain, blood in stool, constipation, diarrhea, heartburn, melena, nausea and vomiting.  Genitourinary: Negative.   Musculoskeletal: Negative for joint pain and myalgias.  Skin: Negative for rash.  Neurological: Negative for dizziness, tingling, sensory change, weakness and headaches.  Endo/Heme/Allergies: Negative for polydipsia.  Psychiatric/Behavioral: Negative.   All other systems reviewed and are negative.   Physical Exam: There were no vitals taken for this visit. Wt Readings from Last 3 Encounters:  11/03/17 154 lb 3.2 oz (69.9 kg)  09/30/17 155 lb 9.6 oz (70.6 kg)  06/16/17 153 lb 12.8 oz (69.8 kg)   General Appearance: Well  nourished, in no apparent distress. Eyes: PERRLA, EOMs, conjunctiva no swelling or erythema Sinuses: No Frontal/maxillary tenderness ENT/Mouth: Ext aud canals clear, TMs without erythema, bulging. No erythema, swelling, or exudate on post pharynx.  Tonsils not swollen or erythematous. Hearing normal.  Neck: Supple, thyroid normal.  Respiratory: Respiratory effort normal, BS equal bilaterally without rales, rhonchi, wheezing or stridor.  Cardio: RRR with no MRGs. Brisk peripheral pulses without edema.  Abdomen: Soft, + BS.  Non tender, no guarding, rebound, hernias, masses. Lymphatics: Non tender without lymphadenopathy.  Musculoskeletal: Full ROM, 5/5 strength, Normal gait Skin: Warm, dry without rashes, lesions, ecchymosis.  Neuro: Cranial nerves intact. No cerebellar symptoms.  Psych: Awake and oriented X 3, normal affect, Insight and Judgment appropriate.    Izora Ribas, NP 10:05 AM [facility]

## 2018-01-04 ENCOUNTER — Ambulatory Visit: Payer: Self-pay | Admitting: Adult Health

## 2018-01-10 ENCOUNTER — Other Ambulatory Visit: Payer: Self-pay | Admitting: Adult Health

## 2018-01-18 ENCOUNTER — Inpatient Hospital Stay: Payer: Self-pay | Attending: Oncology | Admitting: Oncology

## 2018-01-19 ENCOUNTER — Telehealth: Payer: Self-pay

## 2018-01-19 NOTE — Telephone Encounter (Signed)
Per 8/21 no los

## 2018-01-24 NOTE — Progress Notes (Signed)
FOLLOW UP  Assessment and Plan:   Hypertension Well controlled without medications Monitor blood pressure at home; patient to call if consistently greater than 130/80 Continue DASH diet.   Reminder to go to the ER if any CP, SOB, nausea, dizziness, severe HA, changes vision/speech, left arm numbness and tingling and jaw pain.  Cholesterol Currently above goal; taking atorvastatin 40 mg twice weekly Continue low cholesterol diet and exercise.  Check lipid panel.   Diabetes with other diabetic neurologic complication Overall poorly complaint and very poor control Continue medication:  currently taking metformin 500 mg once daily, agrees to increase to twice daily - declines adding another medication Continue diet and exercise.  Perform daily foot/skin check, notify office of any concerning changes.  Check A1C  BMI 27 Long discussion about weight loss, diet, and exercise Recommended diet heavy in fruits and veggies and low in animal meats, cheeses, and dairy products, appropriate calorie intake Discussed ideal weight for height  Will follow up in 3 months  Vitamin D Def Refuses to have checked, doesn't want to supplement despite discussion of benefits Defer Vit D level  Continue diet and meds as discussed. Further disposition pending results of labs. Discussed med's effects and SE's.   Over 30 minutes of exam, counseling, chart review, and critical decision making was performed.   No future appointments.  ----------------------------------------------------------------------------------------------------------------------  HPI 62 y.o. female  presents for 3 month follow up on hypertension, cholesterol, diabetes, weight and vitamin D deficiency. She has hx of colon cancer in 2010, s/p resection and established with [name]; she has had unexplained leukocytosis and microcytosis documented since 2010.  Etiology remains unclear with possible myeloproliferative disorder and was  referred back to Oncology for evaluation and has been found to be stable.   BMI is Body mass index is 27.46 kg/m., she has been working on diet- has cut out sweet tea and sodas, is not doing much exercise recently.  She is still drinking a lot of diet soda, admits to not drinking much water.  Wt Readings from Last 3 Encounters:  01/25/18 155 lb (70.3 kg)  11/03/17 154 lb 3.2 oz (69.9 kg)  09/30/17 155 lb 9.6 oz (70.6 kg)   Her blood pressure has been controlled at home, today their BP is BP: 108/64  She does not workout. She denies chest pain, shortness of breath, dizziness.   She is on cholesterol medication (atorvastatin 40 mg "couple times a week") and denies myalgias. Her cholesterol is not at goal. The cholesterol last visit was:   Lab Results  Component Value Date   CHOL 166 09/30/2017   HDL 30 (L) 09/30/2017   LDLCALC 95 09/30/2017   TRIG 310 (H) 09/30/2017   CHOLHDL 5.5 (H) 09/30/2017    She has been working on diet and diabetes, and denies foot ulcerations, increased appetite, nausea, polydipsia, polyuria, vomiting and weight loss. She does not currently take sugars at home, refuses to do so. She has reported intolerance to multiple oral agents and has been poorly compliant with therapy, and is poorly compliant with dietary recommendations as well. Consequently A1Cs have been very poorly controlled. Current prescribed metformin which she does report has been taking 500 mg daily consistently, tolerating well after initial mild GI symptoms.  Last A1C in the office was:  Lab Results  Component Value Date   HGBA1C 10.5 (H) 09/30/2017   Patient is not on Vitamin D supplement.   Lab Results  Component Value Date   VD25OH 10 (L)  08/27/2014        Current Medications:  Current Outpatient Medications on File Prior to Visit  Medication Sig  . atorvastatin (LIPITOR) 80 MG tablet Take 1/2 to 1 tablet daily for Cholesterol or as directed instead of simvastatin  . citalopram (CELEXA)  40 MG tablet Take 1 tablet (40 mg total) by mouth daily.  Marland Kitchen gabapentin (NEURONTIN) 300 MG capsule TAKE 1 TO 3 CAPSULES BY MOUTH NIGHTLY FOR PAIN  . metFORMIN (GLUMETZA) 500 MG (MOD) 24 hr tablet Take 1 tablet (500 mg total) by mouth daily with breakfast.  . promethazine (PHENERGAN) 25 MG tablet Take 1 tablet (25 mg total) by mouth every 6 (six) hours as needed for nausea or vomiting.  Marland Kitchen QUEtiapine (SEROQUEL) 25 MG tablet Take 1 tablet (25 mg total) by mouth at bedtime.  . docusate sodium (COLACE) 250 MG capsule Take 1 capsule (250 mg total) by mouth daily. While taking pain medications (Patient not taking: Reported on 01/25/2018)   No current facility-administered medications on file prior to visit.      Allergies:  Allergies  Allergen Reactions  . Codeine   . Glipizide Nausea And Vomiting  . Lunesta [Eszopiclone]     Excessive fatigue  . Welchol [Colesevelam Hcl]     nausea      Medical History:  Past Medical History:  Diagnosis Date  . Anxiety   . Cervical dysplasia   . Colon cancer ([place])   . Depression   . Diabetes ([place])   . Hyperlipidemia   . Vitamin D deficiency    Family history- Reviewed and unchanged Social history- Reviewed and unchanged   Review of Systems:  Review of Systems  Constitutional: Negative for malaise/fatigue and weight loss.  HENT: Negative for hearing loss and tinnitus.   Eyes: Negative for blurred vision and double vision.  Respiratory: Negative for cough, shortness of breath and wheezing.   Cardiovascular: Negative for chest pain, palpitations, orthopnea, claudication and leg swelling.  Gastrointestinal: Negative for abdominal pain, blood in stool, constipation, diarrhea, heartburn, melena, nausea and vomiting.  Genitourinary: Negative.   Musculoskeletal: Negative for joint pain and myalgias.  Skin: Negative for rash.  Neurological: Negative for dizziness, tingling, sensory change, weakness and headaches.  Endo/Heme/Allergies: Negative for  polydipsia.  Psychiatric/Behavioral: Negative.   All other systems reviewed and are negative.   Physical Exam: BP 108/64   Pulse 83   Temp (!) 97.5 F (36.4 C)   Ht 5\' 3"  (1.6 m)   Wt 155 lb (70.3 kg)   SpO2 98%   BMI 27.46 kg/m  Wt Readings from Last 3 Encounters:  01/25/18 155 lb (70.3 kg)  11/03/17 154 lb 3.2 oz (69.9 kg)  09/30/17 155 lb 9.6 oz (70.6 kg)   General Appearance: Well nourished, in no apparent distress. Eyes: PERRLA, EOMs, conjunctiva no swelling or erythema Sinuses: No Frontal/maxillary tenderness ENT/Mouth: Ext aud canals clear, TMs without erythema, bulging. No erythema, swelling, or exudate on post pharynx.  Tonsils not swollen or erythematous. Hearing normal.  Neck: Supple, thyroid normal.  Respiratory: Respiratory effort normal, BS equal bilaterally without rales, rhonchi, wheezing or stridor.  Cardio: RRR with no MRGs. Brisk peripheral pulses without edema.  Abdomen: Soft, + BS.  Non tender, no guarding, rebound, hernias, masses. Lymphatics: Non tender without lymphadenopathy.  Musculoskeletal: Full ROM, 5/5 strength, Normal gait Skin: Warm, dry without rashes, lesions, ecchymosis.  Neuro: Cranial nerves intact. No cerebellar symptoms.  Psych: Awake and oriented X 3, normal affect, Insight and Judgment appropriate.  Dorothy Ribas, NP 4:27 PM [facility]

## 2018-01-25 ENCOUNTER — Other Ambulatory Visit: Payer: Self-pay | Admitting: Physician Assistant

## 2018-01-25 ENCOUNTER — Encounter: Payer: Self-pay | Admitting: Adult Health

## 2018-01-25 ENCOUNTER — Ambulatory Visit (INDEPENDENT_AMBULATORY_CARE_PROVIDER_SITE_OTHER): Payer: Self-pay | Admitting: Adult Health

## 2018-01-25 VITALS — BP 108/64 | HR 83 | Temp 97.5°F | Ht 63.0 in | Wt 155.0 lb

## 2018-01-25 DIAGNOSIS — E1122 Type 2 diabetes mellitus with diabetic chronic kidney disease: Secondary | ICD-10-CM

## 2018-01-25 DIAGNOSIS — F3341 Major depressive disorder, recurrent, in partial remission: Secondary | ICD-10-CM

## 2018-01-25 DIAGNOSIS — E1165 Type 2 diabetes mellitus with hyperglycemia: Secondary | ICD-10-CM

## 2018-01-25 DIAGNOSIS — E663 Overweight: Secondary | ICD-10-CM

## 2018-01-25 DIAGNOSIS — E785 Hyperlipidemia, unspecified: Secondary | ICD-10-CM

## 2018-01-25 DIAGNOSIS — D72829 Elevated white blood cell count, unspecified: Secondary | ICD-10-CM

## 2018-01-25 DIAGNOSIS — Z794 Long term (current) use of insulin: Secondary | ICD-10-CM

## 2018-01-25 DIAGNOSIS — N181 Chronic kidney disease, stage 1: Secondary | ICD-10-CM

## 2018-01-25 DIAGNOSIS — F329 Major depressive disorder, single episode, unspecified: Secondary | ICD-10-CM

## 2018-01-25 DIAGNOSIS — E559 Vitamin D deficiency, unspecified: Secondary | ICD-10-CM

## 2018-01-25 DIAGNOSIS — Z79899 Other long term (current) drug therapy: Secondary | ICD-10-CM

## 2018-01-25 DIAGNOSIS — F32A Depression, unspecified: Secondary | ICD-10-CM

## 2018-01-25 MED ORDER — CITALOPRAM HYDROBROMIDE 40 MG PO TABS: 40.0000 mg | ORAL_TABLET | Freq: Every day | ORAL | 1 refills | Status: DC

## 2018-01-25 MED ORDER — METFORMIN HCL ER (MOD) 500 MG PO TB24: ORAL_TABLET | ORAL | 1 refills | Status: DC

## 2018-01-25 MED ORDER — QUETIAPINE FUMARATE 25 MG PO TABS: 25.0000 mg | ORAL_TABLET | Freq: Every day | ORAL | 0 refills | Status: DC

## 2018-01-25 NOTE — Patient Instructions (Signed)
Please try to slowly increase metformin - add 1/2-1 tab earlier in the day with a spoon of peanut butter  Try to reduce diet soda, increase water  Try to walk at least 20 min every day   Aim for 5-10 servings of fruits and vegetables daily  65-80+ fluid ounces of water or unsweet tea for healthy kidneys  Limit to max 1 drink of alcohol per day; avoid smoking/tobacco  Limit animal fats in diet for cholesterol and heart health - choose grass fed whenever available  Avoid highly processed foods, and foods high in saturated/trans fats  Aim for low stress - take time to unwind and care for your mental health  Aim for 150 min of moderate intensity exercise weekly for heart health, and weights twice weekly for bone health  Aim for 7-9 hours of sleep daily      When it comes to diets, agreement about the perfect plan isn't easy to find, even among the experts. Experts at the [place] developed an idea known as the Healthy Eating Plate. Just imagine a plate divided into logical, healthy portions.  The emphasis is on diet quality:  Load up on vegetables and fruits - one-half of your plate: Aim for color and variety, and remember that potatoes don't count.  Go for whole grains - one-quarter of your plate: Whole wheat, barley, wheat berries, quinoa, oats, brown rice, and foods made with them. If you want pasta, go with whole wheat pasta.  Protein power - one-quarter of your plate: Fish, chicken, beans, and nuts are all healthy, versatile protein sources. Limit red meat.  The diet, however, does go beyond the plate, offering a few other suggestions.  Use healthy plant oils, such as olive, canola, soy, corn, sunflower and peanut. Check the labels, and avoid partially hydrogenated oil, which have unhealthy trans fats.  If you're thirsty, drink water. Coffee and tea are good in moderation, but skip sugary drinks and limit milk and dairy products to one or two daily  servings.  The type of carbohydrate in the diet is more important than the amount. Some sources of carbohydrates, such as vegetables, fruits, whole grains, and beans-are healthier than others.  Finally, stay active.

## 2018-01-26 ENCOUNTER — Telehealth: Payer: Self-pay

## 2018-01-26 ENCOUNTER — Other Ambulatory Visit: Payer: Self-pay | Admitting: Internal Medicine

## 2018-01-26 DIAGNOSIS — E1165 Type 2 diabetes mellitus with hyperglycemia: Secondary | ICD-10-CM

## 2018-01-26 LAB — COMPLETE METABOLIC PANEL WITH GFR
AG Ratio: 1.5 (calc) (ref 1.0–2.5)
ALKALINE PHOSPHATASE (APISO): 98 U/L (ref 33–130)
ALT: 10 U/L (ref 6–29)
AST: 12 U/L (ref 10–35)
Albumin: 4.4 g/dL (ref 3.6–5.1)
BUN: 9 mg/dL (ref 7–25)
CO2: 27 mmol/L (ref 20–32)
CREATININE: 0.78 mg/dL (ref 0.50–0.99)
Calcium: 10.5 mg/dL — ABNORMAL HIGH (ref 8.6–10.4)
Chloride: 102 mmol/L (ref 98–110)
GFR, Est African American: 94 mL/min/{1.73_m2} (ref >=60)
GFR, Est Non African American: 81 mL/min/{1.73_m2} (ref >=60)
GLOBULIN: 3 g/dL (ref 1.9–3.7)
Glucose, Bld: 158 mg/dL — ABNORMAL HIGH (ref 65–99)
POTASSIUM: 5.1 mmol/L (ref 3.5–5.3)
SODIUM: 140 mmol/L (ref 135–146)
Total Bilirubin: 0.5 mg/dL (ref 0.2–1.2)
Total Protein: 7.4 g/dL (ref 6.1–8.1)

## 2018-01-26 LAB — MAGNESIUM: MAGNESIUM: 2.1 mg/dL (ref 1.5–2.5)

## 2018-01-26 LAB — CBC WITH DIFFERENTIAL/PLATELET
BASOS PCT: 0.7 %
Basophils Absolute: 122 cells/uL (ref 0–200)
Eosinophils Absolute: 331 cells/uL (ref 15–500)
Eosinophils Relative: 1.9 %
HCT: 39.9 % (ref 35.0–45.0)
Hemoglobin: 12.5 g/dL (ref 11.7–15.5)
Lymphs Abs: 5881 cells/uL — ABNORMAL HIGH (ref 850–3900)
MCH: 19.2 pg — ABNORMAL LOW (ref 27.0–33.0)
MCHC: 31.3 g/dL — ABNORMAL LOW (ref 32.0–36.0)
MCV: 61.4 fL — ABNORMAL LOW (ref 80.0–100.0)
MONOS PCT: 4.1 %
MPV: 9.4 fL (ref 7.5–12.5)
Neutro Abs: 10353 cells/uL — ABNORMAL HIGH (ref 1500–7800)
Neutrophils Relative %: 59.5 %
PLATELETS: 455 10*3/uL — AB (ref 140–400)
RBC: 6.5 10*6/uL — AB (ref 3.80–5.10)
RDW: 18 % — ABNORMAL HIGH (ref 11.0–15.0)
TOTAL LYMPHOCYTE: 33.8 %
WBC mixed population: 713 cells/uL (ref 200–950)
WBC: 17.4 10*3/uL — AB (ref 3.8–10.8)

## 2018-01-26 LAB — HEMOGLOBIN A1C
Hgb A1c MFr Bld: 9.5 % of total Hgb — ABNORMAL HIGH (ref <5.7)
Mean Plasma Glucose: 226 (calc)
eAG (mmol/L): 12.5 (calc)

## 2018-01-26 LAB — LIPID PANEL
CHOLESTEROL: 262 mg/dL — AB (ref <200)
HDL: 32 mg/dL — AB (ref >50)
Non-HDL Cholesterol (Calc): 230 mg/dL (calc) — ABNORMAL HIGH (ref <130)
TRIGLYCERIDES: 497 mg/dL — AB (ref <150)
Total CHOL/HDL Ratio: 8.2 (calc) — ABNORMAL HIGH (ref <5.0)

## 2018-01-26 LAB — CBC MORPHOLOGY

## 2018-01-26 LAB — TSH: TSH: 2.75 mIU/L (ref 0.40–4.50)

## 2018-01-26 MED ORDER — METFORMIN HCL ER 500 MG PO TB24: ORAL_TABLET | ORAL | 0 refills | Status: DC

## 2018-01-26 NOTE — Telephone Encounter (Signed)
Left a detailed message for the patient of her upcoming appointment. Per 8/29 los also will mail a letter with a calender enclosed

## 2018-02-01 ENCOUNTER — Ambulatory Visit: Payer: Self-pay | Admitting: Oncology

## 2018-02-24 ENCOUNTER — Inpatient Hospital Stay: Payer: Self-pay | Attending: Oncology | Admitting: Oncology

## 2018-02-24 ENCOUNTER — Telehealth: Payer: Self-pay | Admitting: Oncology

## 2018-02-24 VITALS — BP 132/68 | HR 98 | Temp 98.4°F | Resp 18 | Ht 63.0 in | Wt 156.1 lb

## 2018-02-24 DIAGNOSIS — Z85038 Personal history of other malignant neoplasm of large intestine: Secondary | ICD-10-CM

## 2018-02-24 DIAGNOSIS — R634 Abnormal weight loss: Secondary | ICD-10-CM

## 2018-02-24 DIAGNOSIS — Z79899 Other long term (current) drug therapy: Secondary | ICD-10-CM

## 2018-02-24 DIAGNOSIS — C189 Malignant neoplasm of colon, unspecified: Secondary | ICD-10-CM

## 2018-02-24 DIAGNOSIS — D3502 Benign neoplasm of left adrenal gland: Secondary | ICD-10-CM

## 2018-02-24 DIAGNOSIS — R109 Unspecified abdominal pain: Secondary | ICD-10-CM

## 2018-02-24 DIAGNOSIS — D72828 Other elevated white blood cell count: Secondary | ICD-10-CM

## 2018-02-24 DIAGNOSIS — Z7984 Long term (current) use of oral hypoglycemic drugs: Secondary | ICD-10-CM

## 2018-02-24 NOTE — Progress Notes (Signed)
Hematology and Oncology Follow Up Visit  Dorothy Boyd [id number] May 15, 1956 62 y.o. 02/24/2018 1:16 PM Unk Pinto, MDMcKeown, Gwyndolyn Saxon, MD   Principle Diagnosis: 62 year old with:  1.  Colon cancer diagnosed in 2010.  She presented with stage II (T3, N0, M0) adenocarcinoma of the sigmoid colon.  2.  Leukocytosis and microcytosis dating back to 2005.  Her work-up has been unrevealing for a myeloproliferative disorder including a bone marrow biopsy.  Prior Therapy:  She is status post a bone marrow biopsy contained in 2005 which showed no evidence of myeloproliferative disorder. She is status post a rectosigmoid resection which showed invasive moderately differentiated adenocarcinoma spanning 6 cm. 0/16 lymph nodes sampled none had  any evidence of cancer. This was done in August of 2010. She was treated with adjuvant FOLFOX chemotherapy therapy completed in 2011 under the care of Dr. Marin Olp.    Current therapy: Active surveillance.  Interim History: Dorothy Boyd is here for a return visit.  She does not report any headaches, blurry vision, syncope or seizures.  She denies any alteration mental status or confusion.  Does not report any fevers, chills or sweats.  Does not report any cough, wheezing or hemoptysis.  Does not report any chest pain, palpitation, orthopnea or leg edema.  Does not report any nausea, vomiting or abdominal pain.  Does not report any change in bowel habits. Does not report any arthralgias or myalgias.    Does not report frequency, urgency or hematuria.  Does not report any skin rashes or lesions.  Does not report any lymphadenopathy or petechiae.  Does not report any mood changes.  She denies any bleeding or clotting tendency.  Remaining review of systems is negative.     Medications: I have reviewed the patient's current medications.  Current Outpatient Medications  Medication Sig Dispense Refill  . atorvastatin (LIPITOR) 80 MG tablet Take 1/2 to 1 tablet  daily for Cholesterol or as directed instead of simvastatin 90 tablet 1  . citalopram (CELEXA) 40 MG tablet Take 1 tablet (40 mg total) by mouth daily. 90 tablet 1  . gabapentin (NEURONTIN) 300 MG capsule TAKE 1 TO 3 CAPSULES BY MOUTH NIGHTLY FOR PAIN 90 capsule 2  . metFORMIN (GLUCOPHAGE-XR) 500 MG 24 hr tablet Take 1 to 2 tablets 2 x /day with meals as directed for Diabetes 360 tablet 0  . promethazine (PHENERGAN) 25 MG tablet Take 1 tablet (25 mg total) by mouth every 6 (six) hours as needed for nausea or vomiting. 30 tablet 3  . QUEtiapine (SEROQUEL) 25 MG tablet Take 1 tablet (25 mg total) by mouth at bedtime. 90 tablet 0   No current facility-administered medications for this visit.      Allergies:  Allergies  Allergen Reactions  . Codeine   . Glipizide Nausea And Vomiting  . Lunesta [Eszopiclone]     Excessive fatigue  . Welchol [Colesevelam Hcl]     nausea     Past Medical History, Surgical history, Social history, and Family History were reviewed and updated.   Physical Exam:  ECOG: 1  General appearance: Comfortable appearing without any discomfort Head: Normocephalic without any trauma Oropharynx: Mucous membranes are moist and pink without any thrush or ulcers. Eyes: Pupils are equal and round reactive to light. Lymph nodes: No cervical, supraclavicular, inguinal or axillary lymphadenopathy.   Heart:regular rate and rhythm.  S1 and S2 without leg edema. Lung: Clear without any rhonchi or wheezes.  No dullness to percussion. Abdomin: Soft, nontender, nondistended with  good bowel sounds.  No hepatosplenomegaly. Musculoskeletal: No joint deformity or effusion.  Full range of motion noted. Neurological: No deficits noted on motor, sensory and deep tendon reflex exam. Skin: No petechial rash or dryness.  Appeared moist.     Lab Results: Lab Results  Component Value Date   WBC 17.4 (H) 01/25/2018   HGB 12.5 01/25/2018   HCT 39.9 01/25/2018   MCV 61.4 (L)  01/25/2018   PLT 455 (H) 01/25/2018     Chemistry      Component Value Date/Time   NA 140 01/25/2018 1630   NA 136 09/21/2013 1315   K 5.1 01/25/2018 1630   K 3.9 09/21/2013 1315   CL 102 01/25/2018 1630   CO2 27 01/25/2018 1630   CO2 22 09/21/2013 1315   BUN 9 01/25/2018 1630   BUN 16.3 09/21/2013 1315   CREATININE 0.78 01/25/2018 1630   CREATININE 0.8 09/21/2013 1315      Component Value Date/Time   CALCIUM 10.5 (H) 01/25/2018 1630   CALCIUM 9.9 09/21/2013 1315   ALKPHOS 96 12/15/2017 1207   ALKPHOS 96 09/21/2013 1315   AST 12 01/25/2018 1630   AST 15 12/15/2017 1207   AST 13 09/21/2013 1315   ALT 10 01/25/2018 1630   ALT 17 12/15/2017 1207   ALT 14 09/21/2013 1315   BILITOT 0.5 01/25/2018 1630   BILITOT 0.5 12/15/2017 1207   BILITOT 0.48 09/21/2013 1315      EXAM: CT ABDOMEN AND PELVIS WITH CONTRAST  TECHNIQUE: Multidetector CT imaging of the abdomen and pelvis was performed using the standard protocol following bolus administration of intravenous contrast.  CONTRAST:  145m ISOVUE-300 IOPAMIDOL (ISOVUE-300) INJECTION 61%  COMPARISON:  12/04/2016  FINDINGS: Lower Chest: No acute findings.  Hepatobiliary: A tiny sub-cm low-attenuation lesion in the inferior right hepatic lobe is stable, likely representing a tiny cyst. No new or enlarging hepatic lesions are identified. Prior cholecystectomy. No evidence of biliary obstruction.  Pancreas:  No mass or inflammatory changes.  Spleen: Within normal limits in size and appearance.  Adrenals/Urinary Tract: Stable small benign left adrenal adenoma. Normal appearance of both kidneys. Unremarkable unopacified urinary bladder.  Stomach/Bowel: No evidence of obstruction, wall thickening, inflammatory process or abnormal fluid collections.  Vascular/Lymphatic: No pathologically enlarged lymph nodes. No abdominal aortic aneurysm. Aortic atherosclerosis.  Reproductive:  No mass or other significant  abnormality.  Other:  None.  Musculoskeletal:  No suspicious bone lesions identified.  IMPRESSION: No acute findings. No evidence of recurrent or metastatic carcinoma.  Stable benign left adrenal adenoma.   Impression and Plan:  62year old woman with:  1. Leukocytosis diagnosed in 2005: The differential diagnosis was reviewed again which include reactive leukocytosis versus myeloproliferative disorder.  Her work-up in the past has been unrevealing with a bone marrow biopsy in 2005, molecular testing for Jak 2 mutation was also negative.  Her most recent CBC obtained on January 25, 2018 showed a white cell count of 17,000 with normal differential.  This is not changed for the last 15 years.  I recommended continued active surveillance and consider repeating a bone marrow biopsy in the future if her white cell count starts to increase.  She has been smoking majority of her adult life and currently vaping.  I encouraged her to quit both at this time.  2.  Microcytosis: Work-up has been unrevealing in the past she has no evidence of anemia.  I will recheck a hemoglobin electrophoresis to rule out thalassemia.  3.  Weight loss  and abdominal pain: CT scan of the abdomen and pelvis done in July 2019 showed no abnormalities at this time.  She has no splenomegaly to explain her abdominal discomfort.  4. Stage II colon cancer: No evidence of recurrent disease since her diagnosis.  Risk of relapse is very low at this time.  I recommended surveillance colonoscopy but no further testing.   6. Followup: We will be in 6 months for repeat examination.  She does have lab appointments with her primary care physician which we can certainly use without needing to repeat laboratory testing.  15  minutes was spent with the patient face-to-face today.  More than 50% of time was dedicated to reviewing the differential diagnosis, laboratory data and urinating plan of care.     Zola Button,  MD 9/27/20191:16 PM

## 2018-02-24 NOTE — Telephone Encounter (Signed)
Appts scheduled avs declined / calendar printed per 9/27 los

## 2018-04-22 ENCOUNTER — Other Ambulatory Visit: Payer: Self-pay | Admitting: Internal Medicine

## 2018-04-22 DIAGNOSIS — R112 Nausea with vomiting, unspecified: Secondary | ICD-10-CM

## 2018-04-22 MED ORDER — PROMETHAZINE HCL 25 MG PO TABS: ORAL_TABLET | ORAL | 1 refills | Status: DC

## 2018-04-25 ENCOUNTER — Other Ambulatory Visit: Payer: Self-pay

## 2018-04-25 MED ORDER — PROMETHAZINE HCL 25 MG PO TABS: 25.0000 mg | ORAL_TABLET | Freq: Four times a day (QID) | ORAL | 3 refills | Status: DC | PRN

## 2018-04-26 NOTE — Progress Notes (Deleted)
FOLLOW UP  Assessment and Plan:   Hypertension Well controlled without medications Monitor blood pressure at home; patient to call if consistently greater than 130/80 Continue DASH diet.   Reminder to go to the ER if any CP, SOB, nausea, dizziness, severe HA, changes vision/speech, left arm numbness and tingling and jaw pain.  Cholesterol Currently above goal; taking atorvastatin 40 mg twice weekly *** Continue low cholesterol diet and exercise.  Check lipid panel.   Diabetes with other diabetic neurologic complication Overall poorly compliant and very poor control Continue medication:  currently taking metformin 500 mg ***,  - declines adding another medication Continue diet and exercise.  Perform daily foot/skin check, notify office of any concerning changes.  Check A1C  BMI 27 Long discussion about weight loss, diet, and exercise Recommended diet heavy in fruits and veggies and low in animal meats, cheeses, and dairy products, appropriate calorie intake Discussed ideal weight for height  Will follow up in 3 months  Vitamin D Def Refuses to have checked, doesn't want to supplement despite discussion of benefits Defer Vit D level  Continue diet and meds as discussed. Further disposition pending results of labs. Discussed med's effects and SE's.   Over 30 minutes of exam, counseling, chart review, and critical decision making was performed.   Future Appointments  Date Time Provider [place]  05/01/2018  3:45 PM Liane Comber, NP [facility] None  08/25/2018  3:00 PM Wyatt Portela, MD [facility] None    ----------------------------------------------------------------------------------------------------------------------  HPI 62 y.o. female  presents for 3 month follow up on hypertension, cholesterol, diabetes, weight and vitamin D deficiency. She has hx of colon cancer in 2010, s/p resection and established with [name]; she has had unexplained leukocytosis  and microcytosis documented since 2010.  Etiology remains unclear with possible myeloproliferative disorder and was referred back to Oncology for evaluation and has been found to be stable.   BMI is There is no height or weight on file to calculate BMI., she has been working on diet- has cut out sweet tea and sodas, is not doing much exercise recently.  She is still drinking a lot of diet soda, admits to not drinking much water.  Wt Readings from Last 3 Encounters:  02/24/18 156 lb 1.6 oz (70.8 kg)  01/25/18 155 lb (70.3 kg)  11/03/17 154 lb 3.2 oz (69.9 kg)   Her blood pressure has been controlled at home, today their BP is    She does not workout. She denies chest pain, shortness of breath, dizziness.   She is on cholesterol medication (atorvastatin 40 mg "couple times a week") and denies myalgias. Her cholesterol is not at goal. The cholesterol last visit was:   Lab Results  Component Value Date   CHOL 262 (H) 01/25/2018   HDL 32 (L) 01/25/2018   [place]  01/25/2018     Comment:     . LDL cholesterol not calculated. Triglyceride levels greater than 400 mg/dL invalidate calculated LDL results. . Reference range: <100 . Desirable range <100 mg/dL for primary prevention;   <70 mg/dL for patients with CHD or diabetic patients  with > or = 2 CHD risk factors. Marland Kitchen LDL-C is now calculated using the [name]  calculation, which is a validated novel method providing  better accuracy than the Friedewald equation in the  estimation of LDL-C.  [name] et al. [name]. 0272;536(64): 2061-2068  (http://education.[employer].com/faq/FAQ164)    TRIG 497 (H) 01/25/2018   CHOLHDL 8.2 (H) 01/25/2018    She  has been working on diet and diabetes, and denies foot ulcerations, increased appetite, nausea, polydipsia, polyuria, vomiting and weight loss. She does not currently take sugars at home, refuses to do so. She has reported intolerance to multiple oral agents and has been poorly  compliant with therapy, and is poorly compliant with dietary recommendations as well. Consequently A1Cs have been very poorly controlled. Current prescribed metformin which she does report has been taking 500 mg ***  consistently, tolerating well after initial mild GI symptoms.  Last A1C in the office was:  Lab Results  Component Value Date   HGBA1C 9.5 (H) 01/25/2018   Patient is not on Vitamin D supplement.   Lab Results  Component Value Date   VD25OH 10 (L) 08/27/2014        Current Medications:  Current Outpatient Medications on File Prior to Visit  Medication Sig  . atorvastatin (LIPITOR) 80 MG tablet Take 1/2 to 1 tablet daily for Cholesterol or as directed instead of simvastatin  . citalopram (CELEXA) 40 MG tablet Take 1 tablet (40 mg total) by mouth daily.  Marland Kitchen gabapentin (NEURONTIN) 300 MG capsule TAKE 1 TO 3 CAPSULES BY MOUTH NIGHTLY FOR PAIN  . metFORMIN (GLUCOPHAGE-XR) 500 MG 24 hr tablet Take 1 to 2 tablets 2 x /day with meals as directed for Diabetes  . promethazine (PHENERGAN) 25 MG tablet Take 1 tablet every 4 hour if needed for Nausea / Vomitting  . promethazine (PHENERGAN) 25 MG tablet Take 1 tablet (25 mg total) by mouth every 6 (six) hours as needed for nausea or vomiting.  Marland Kitchen QUEtiapine (SEROQUEL) 25 MG tablet Take 1 tablet (25 mg total) by mouth at bedtime.   No current facility-administered medications on file prior to visit.      Allergies:  Allergies  Allergen Reactions  . Codeine   . Glipizide Nausea And Vomiting  . Lunesta [Eszopiclone]     Excessive fatigue  . Welchol [Colesevelam Hcl]     nausea      Medical History:  Past Medical History:  Diagnosis Date  . Anxiety   . Cervical dysplasia   . Colon cancer ([place])   . Depression   . Diabetes ([place])   . Hyperlipidemia   . Vitamin D deficiency    Family history- Reviewed and unchanged Social history- Reviewed and unchanged   Review of Systems:  Review of Systems  Constitutional: Negative  for malaise/fatigue and weight loss.  HENT: Negative for hearing loss and tinnitus.   Eyes: Negative for blurred vision and double vision.  Respiratory: Negative for cough, shortness of breath and wheezing.   Cardiovascular: Negative for chest pain, palpitations, orthopnea, claudication and leg swelling.  Gastrointestinal: Negative for abdominal pain, blood in stool, constipation, diarrhea, heartburn, melena, nausea and vomiting.  Genitourinary: Negative.   Musculoskeletal: Negative for joint pain and myalgias.  Skin: Negative for rash.  Neurological: Negative for dizziness, tingling, sensory change, weakness and headaches.  Endo/Heme/Allergies: Negative for polydipsia.  Psychiatric/Behavioral: Negative.   All other systems reviewed and are negative.   Physical Exam: There were no vitals taken for this visit. Wt Readings from Last 3 Encounters:  02/24/18 156 lb 1.6 oz (70.8 kg)  01/25/18 155 lb (70.3 kg)  11/03/17 154 lb 3.2 oz (69.9 kg)   General Appearance: Well nourished, in no apparent distress. Eyes: PERRLA, EOMs, conjunctiva no swelling or erythema Sinuses: No Frontal/maxillary tenderness ENT/Mouth: Ext aud canals clear, TMs without erythema, bulging. No erythema, swelling, or exudate on post pharynx.  Tonsils not swollen or erythematous. Hearing normal.  Neck: Supple, thyroid normal.  Respiratory: Respiratory effort normal, BS equal bilaterally without rales, rhonchi, wheezing or stridor.  Cardio: RRR with no MRGs. Brisk peripheral pulses without edema.  Abdomen: Soft, + BS.  Non tender, no guarding, rebound, hernias, masses. Lymphatics: Non tender without lymphadenopathy.  Musculoskeletal: Full ROM, 5/5 strength, Normal gait Skin: Warm, dry without rashes, lesions, ecchymosis.  Neuro: Cranial nerves intact. No cerebellar symptoms.  Psych: Awake and oriented X 3, normal affect, Insight and Judgment appropriate.    Izora Ribas, NP 4:57 PM [facility]

## 2018-05-01 ENCOUNTER — Ambulatory Visit: Payer: Self-pay | Admitting: Adult Health

## 2018-05-02 ENCOUNTER — Other Ambulatory Visit: Payer: Self-pay | Admitting: Adult Health

## 2018-05-02 DIAGNOSIS — F3341 Major depressive disorder, recurrent, in partial remission: Secondary | ICD-10-CM

## 2018-05-09 NOTE — Progress Notes (Signed)
FOLLOW UP  Assessment and Plan:   Hypertension Well controlled without medications Monitor blood pressure at home; patient to call if consistently greater than 130/80 Continue DASH diet.   Reminder to go to the ER if any CP, SOB, nausea, dizziness, severe HA, changes vision/speech, left arm numbness and tingling and jaw pain.  Cholesterol Currently above goal; has been out of atorvastatin - restart taking 40 mg daily  Continue low cholesterol diet and exercise.  Check lipid panel.   Diabetes with other diabetic neurologic complication Overall poorly compliant and very poor control Continue medication:  currently taking metformin 500 mg 1-2 tabs daily- discussed increasing meals during the day, small, frequent, increase to 3 tabs/day,  - declines adding another medication Continue diet and exercise.  Perform daily foot/skin check, notify office of any concerning changes.  Check A1C  BMI 27 Long discussion about weight loss, diet, and exercise Recommended diet heavy in fruits and veggies and low in animal meats, cheeses, and dairy products, appropriate calorie intake Discussed ideal weight for height  Will follow up in 3 months  Vitamin D Def Newly on ?5000 IU Defer Vit D level  Insomnia Try trazodone instead of seroquel - start low and taper up, 150 mg tab written to help financially Insomnia- good sleep hygiene discussed, increase day time activity  Continue diet and meds as discussed. Further disposition pending results of labs. Discussed med's effects and SE's.   Over 30 minutes of exam, counseling, chart review, and critical decision making was performed.   Future Appointments  Date Time Provider [place]  05/10/2018  3:30 PM Liane Comber, NP [facility] None  08/25/2018  3:00 PM Wyatt Portela, MD [facility] None    ----------------------------------------------------------------------------------------------------------------------  HPI 62 y.o.  female  presents for 3 month follow up on hypertension, cholesterol, diabetes, weight and vitamin D deficiency. She has hx of colon cancer in 2010, s/p resection and established with [name]; she has had unexplained leukocytosis and microcytosis documented since 2010.  Etiology remains unclear with possible myeloproliferative disorder and was referred back to Oncology for evaluation and has been found to be stable.   BMI is Body mass index is 28.87 kg/m., she has been working on diet- has cut out sweet tea and sodas, is not doing much exercise recently.  She is still drinking a lot of diet soda, admits to not drinking much water. She reports limiting portions. Eats bland foods, chicken, cereal, etc.  Wt Readings from Last 3 Encounters:  05/10/18 163 lb (73.9 kg)  02/24/18 156 lb 1.6 oz (70.8 kg)  01/25/18 155 lb (70.3 kg)   Her blood pressure has been controlled at home, today their BP is BP: 122/74  She does not workout. She denies chest pain, shortness of breath, dizziness.   She is on cholesterol medication (atorvastatin 40 mg "couple times a week" but ran out over a month ago) and denies myalgias. Her cholesterol is not at goal. The cholesterol last visit was:   Lab Results  Component Value Date   CHOL 262 (H) 01/25/2018   HDL 32 (L) 01/25/2018   [place]  01/25/2018     Comment:     . LDL cholesterol not calculated. Triglyceride levels greater than 400 mg/dL invalidate calculated LDL results. . Reference range: <100 . Desirable range <100 mg/dL for primary prevention;   <70 mg/dL for patients with CHD or diabetic patients  with > or = 2 CHD risk factors. Marland Kitchen LDL-C is now calculated using the  [name]  calculation, which is a validated novel method providing  better accuracy than the Friedewald equation in the  estimation of LDL-C.  [name] et al. [name]. 9030;092(33): 2061-2068  (http://education.[employer].com/faq/FAQ164)    TRIG 497 (H) 01/25/2018   CHOLHDL 8.2  (H) 01/25/2018    She has been working on diet and diabetes, and denies foot ulcerations, increased appetite, nausea, polydipsia, polyuria, vomiting and weight loss. She does not currently take sugars at home, refuses to do so. She has reported intolerance to multiple oral agents and has been poorly compliant with therapy, and is poorly compliant with dietary recommendations as well. Consequently A1Cs have been very poorly controlled. Current prescribed metformin which she does report has been taking 500 mg once or twice daily consistently, tolerating well after initial mild GI symptoms.  Last A1C in the office was:  Lab Results  Component Value Date   HGBA1C 9.5 (H) 01/25/2018   Patient is on Vitamin D supplement, ? Taking 5000 IU dialy   Lab Results  Component Value Date   VD25OH 10 (L) 08/27/2014        Current Medications:  Current Outpatient Medications on File Prior to Visit  Medication Sig  . citalopram (CELEXA) 40 MG tablet Take 1 tablet (40 mg total) by mouth daily.  Marland Kitchen gabapentin (NEURONTIN) 300 MG capsule TAKE 1 TO 3 CAPSULES BY MOUTH NIGHTLY FOR PAIN  . metFORMIN (GLUCOPHAGE-XR) 500 MG 24 hr tablet Take 1 to 2 tablets 2 x /day with meals as directed for Diabetes  . promethazine (PHENERGAN) 25 MG tablet Take 1 tablet every 4 hour if needed for Nausea / Vomitting  . promethazine (PHENERGAN) 25 MG tablet Take 1 tablet (25 mg total) by mouth every 6 (six) hours as needed for nausea or vomiting.  Marland Kitchen QUEtiapine (SEROQUEL) 25 MG tablet TAKE 1 TABLET BY MOUTH AT BEDTIME  . atorvastatin (LIPITOR) 80 MG tablet Take 1/2 to 1 tablet daily for Cholesterol or as directed instead of simvastatin (Patient not taking: Reported on 05/10/2018)   No current facility-administered medications on file prior to visit.      Allergies:  Allergies  Allergen Reactions  . Codeine   . Glipizide Nausea And Vomiting  . Lunesta [Eszopiclone]     Excessive fatigue  . Welchol [Colesevelam Hcl]      nausea      Medical History:  Past Medical History:  Diagnosis Date  . Anxiety   . Cervical dysplasia   . Colon cancer ([place])   . Depression   . Diabetes ([place])   . Hyperlipidemia   . Vitamin D deficiency    Family history- Reviewed and unchanged Social history- Reviewed and unchanged   Review of Systems:  Review of Systems  Constitutional: Negative for malaise/fatigue and weight loss.  HENT: Negative for hearing loss and tinnitus.   Eyes: Negative for blurred vision and double vision.  Respiratory: Negative for cough, shortness of breath and wheezing.   Cardiovascular: Negative for chest pain, palpitations, orthopnea, claudication and leg swelling.  Gastrointestinal: Negative for abdominal pain, blood in stool, constipation, diarrhea, heartburn, melena, nausea and vomiting.  Genitourinary: Negative.   Musculoskeletal: Negative for joint pain and myalgias.  Skin: Negative for rash.  Neurological: Negative for dizziness, tingling, sensory change, weakness and headaches.  Endo/Heme/Allergies: Negative for polydipsia.  Psychiatric/Behavioral: Negative.   All other systems reviewed and are negative.   Physical Exam: BP 122/74   Pulse 88   Temp (!) 97.3 F (36.3 C)   Ht  5\' 3"  (1.6 m)   Wt 163 lb (73.9 kg)   SpO2 95%   BMI 28.87 kg/m  Wt Readings from Last 3 Encounters:  05/10/18 163 lb (73.9 kg)  02/24/18 156 lb 1.6 oz (70.8 kg)  01/25/18 155 lb (70.3 kg)   General Appearance: Well nourished, in no apparent distress. Eyes: PERRLA, EOMs, conjunctiva no swelling or erythema Sinuses: No Frontal/maxillary tenderness ENT/Mouth: Ext aud canals clear, TMs without erythema, bulging. No erythema, swelling, or exudate on post pharynx.  Tonsils not swollen or erythematous. Hearing normal.  Neck: Supple, thyroid normal.  Respiratory: Respiratory effort normal, BS equal bilaterally without rales, rhonchi, wheezing or stridor.  Cardio: RRR with no MRGs. Brisk peripheral pulses  without edema.  Abdomen: Soft, + BS.  Non tender, no guarding, rebound, hernias, masses. Lymphatics: Non tender without lymphadenopathy.  Musculoskeletal: Full ROM, 5/5 strength, Normal gait Skin: Warm, dry without rashes, lesions, ecchymosis.  Neuro: Cranial nerves intact. No cerebellar symptoms.  Psych: Awake and oriented X 3, normal affect, Insight and Judgment appropriate.    Izora Ribas, NP 3:24 PM [facility]

## 2018-05-10 ENCOUNTER — Ambulatory Visit (INDEPENDENT_AMBULATORY_CARE_PROVIDER_SITE_OTHER): Payer: Self-pay | Admitting: Adult Health

## 2018-05-10 ENCOUNTER — Encounter: Payer: Self-pay | Admitting: Adult Health

## 2018-05-10 VITALS — BP 122/74 | HR 88 | Temp 97.3°F | Ht 63.0 in | Wt 163.0 lb

## 2018-05-10 DIAGNOSIS — E1169 Type 2 diabetes mellitus with other specified complication: Secondary | ICD-10-CM

## 2018-05-10 DIAGNOSIS — D72829 Elevated white blood cell count, unspecified: Secondary | ICD-10-CM

## 2018-05-10 DIAGNOSIS — E785 Hyperlipidemia, unspecified: Secondary | ICD-10-CM

## 2018-05-10 DIAGNOSIS — Z794 Long term (current) use of insulin: Secondary | ICD-10-CM

## 2018-05-10 DIAGNOSIS — F32A Depression, unspecified: Secondary | ICD-10-CM

## 2018-05-10 DIAGNOSIS — F329 Major depressive disorder, single episode, unspecified: Secondary | ICD-10-CM

## 2018-05-10 DIAGNOSIS — E663 Overweight: Secondary | ICD-10-CM

## 2018-05-10 DIAGNOSIS — N181 Chronic kidney disease, stage 1: Secondary | ICD-10-CM

## 2018-05-10 DIAGNOSIS — E559 Vitamin D deficiency, unspecified: Secondary | ICD-10-CM

## 2018-05-10 DIAGNOSIS — E1122 Type 2 diabetes mellitus with diabetic chronic kidney disease: Secondary | ICD-10-CM

## 2018-05-10 MED ORDER — ATORVASTATIN CALCIUM 80 MG PO TABS: ORAL_TABLET | ORAL | 1 refills | Status: DC

## 2018-05-10 MED ORDER — TRAZODONE HCL 150 MG PO TABS: 150.0000 mg | ORAL_TABLET | Freq: Every day | ORAL | 2 refills | Status: DC

## 2018-05-10 NOTE — Patient Instructions (Addendum)
Goals    . DIET - INCREASE WATER INTAKE     Aim to work up to 65+ fluid ounces daily       Ideally, I'd like for you to eat small meals at least 3 times a day, and take metformin 1 tab with each meal (so try to increase to 3 tabs a day).    Try trazodone 1/3, 1/2 or, 2/3 or full tab prior to bedtime for sleep.   Insomnia Insomnia is frequent trouble falling and/or staying asleep. Insomnia can be a long term problem or a short term problem. Both are common. Insomnia can be a short term problem when the wakefulness is related to a certain stress or worry. Long term insomnia is often related to ongoing stress during waking hours and/or poor sleeping habits. Overtime, sleep deprivation itself can make the problem worse. Every little thing feels more severe because you are overtired and your ability to cope is decreased. CAUSES   Stress, anxiety, and depression.  Poor sleeping habits.  Distractions such as TV in the bedroom.  Naps close to bedtime.  Engaging in emotionally charged conversations before bed.  Technical reading before sleep.  Alcohol and other sedatives. They may make the problem worse. They can hurt normal sleep patterns and normal dream activity.  Stimulants such as caffeine for several hours prior to bedtime.  Pain syndromes and shortness of breath can cause insomnia.  Exercise late at night.  Changing time zones may cause sleeping problems (jet lag). It is sometimes helpful to have someone observe your sleeping patterns. They should look for periods of not breathing during the night (sleep apnea). They should also look to see how long those periods last. If you live alone or observers are uncertain, you can also be observed at a sleep clinic where your sleep patterns will be professionally monitored. Sleep apnea requires a checkup and treatment. Give your caregivers your medical history. Give your caregivers observations your family has made about your sleep.   SYMPTOMS   Not feeling rested in the morning.  Anxiety and restlessness at bedtime.  Difficulty falling and staying asleep. TREATMENT   Your caregiver may prescribe treatment for an underlying medical disorders. Your caregiver can give advice or help if you are using alcohol or other drugs for self-medication. Treatment of underlying problems will usually eliminate insomnia problems.  Medications can be prescribed for short time use. They are generally not recommended for lengthy use.  Over-the-counter sleep medicines are not recommended for lengthy use. They can be habit forming.  You can promote easier sleeping by making lifestyle changes such as:  Using relaxation techniques that help with breathing and reduce muscle tension.  Exercising earlier in the day.  Changing your diet and the time of your last meal. No night time snacks.  Establish a regular time to go to bed.  Counseling can help with stressful problems and worry.  Soothing music and white noise may be helpful if there are background noises you cannot remove.  Stop tedious detailed work at least one hour before bedtime. HOME CARE INSTRUCTIONS   Keep a diary. Inform your caregiver about your progress. This includes any medication side effects. See your caregiver regularly. Take note of:  Times when you are asleep.  Times when you are awake during the night.  The quality of your sleep.  How you feel the next day. This information will help your caregiver care for you.  Get out of bed if you are still  awake after 15 minutes. Read or do some quiet activity. Keep the lights down. Wait until you feel sleepy and go back to bed.  Keep regular sleeping and waking hours. Avoid naps.  Exercise regularly.  Avoid distractions at bedtime. Distractions include watching television or engaging in any intense or detailed activity like attempting to balance the household checkbook.  Develop a bedtime ritual. Keep a  familiar routine of bathing, brushing your teeth, climbing into bed at the same time each night, listening to soothing music. Routines increase the success of falling to sleep faster.  Use relaxation techniques. This can be using breathing and muscle tension release routines. It can also include visualizing peaceful scenes. You can also help control troubling or intruding thoughts by keeping your mind occupied with boring or repetitive thoughts like the old concept of counting sheep. You can make it more creative like imagining planting one beautiful flower after another in your backyard garden.  During your day, work to eliminate stress. When this is not possible use some of the previous suggestions to help reduce the anxiety that accompanies stressful situations. MAKE SURE YOU:   Understand these instructions.  Will watch your condition.  Will get help right away if you are not doing well or get worse. Document Released: 05/14/2000 Document Revised: 08/09/2011 Document Reviewed: 06/14/2007 [facility] Patient Information 2015 [place], [place]. This information is not intended to replace advice given to you by your health care provider. Make sure you discuss any questions you have with your health care provider.     Trazodone tablets What is this medicine? TRAZODONE (TRAZ oh done) is used to treat depression. This medicine may be used for other purposes; ask your health care provider or pharmacist if you have questions. COMMON BRAND NAME(S): Desyrel What should I tell my health care provider before I take this medicine? They need to know if you have any of these conditions: -attempted suicide or thinking about it -bipolar disorder -bleeding problems -glaucoma -heart disease, or previous heart attack -irregular heart beat -kidney or liver disease -low levels of sodium in the blood -an unusual or allergic reaction to trazodone, other medicines, foods, dyes or preservatives -pregnant or  trying to get pregnant -breast-feeding How should I use this medicine? Take this medicine by mouth with a glass of water. Follow the directions on the prescription label. Take this medicine shortly after a meal or a light snack. Take your medicine at regular intervals. Do not take your medicine more often than directed. Do not stop taking this medicine suddenly except upon the advice of your doctor. Stopping this medicine too quickly may cause serious side effects or your condition may worsen. A special MedGuide will be given to you by the pharmacist with each prescription and refill. Be sure to read this information carefully each time. Talk to your pediatrician regarding the use of this medicine in children. Special care may be needed. Overdosage: If you think you have taken too much of this medicine contact a poison control center or emergency room at once. NOTE: This medicine is only for you. Do not share this medicine with others. What if I miss a dose? If you miss a dose, take it as soon as you can. If it is almost time for your next dose, take only that dose. Do not take double or extra doses. What may interact with this medicine? Do not take this medicine with any of the following medications: -certain medicines for fungal infections like fluconazole, itraconazole, ketoconazole,  posaconazole, voriconazole -cisapride -dofetilide -dronedarone -linezolid -MAOIs like Carbex, Eldepryl, Marplan, Nardil, and Parnate -mesoridazine -methylene blue (injected into a vein) -pimozide -saquinavir -thioridazine -ziprasidone This medicine may also interact with the following medications: -alcohol -antiviral medicines for HIV or AIDS -aspirin and aspirin-like medicines -barbiturates like phenobarbital -certain medicines for blood pressure, heart disease, irregular heart beat -certain medicines for depression, anxiety, or psychotic disturbances -certain medicines for migraine headache like  almotriptan, eletriptan, frovatriptan, naratriptan, rizatriptan, sumatriptan, zolmitriptan -certain medicines for seizures like carbamazepine and phenytoin -certain medicines for sleep -certain medicines that treat or prevent blood clots like dalteparin, enoxaparin, warfarin -digoxin -fentanyl -lithium -NSAIDS, medicines for pain and inflammation, like ibuprofen or naproxen -other medicines that prolong the QT interval (cause an abnormal heart rhythm) -rasagiline -supplements like St. John's wort, kava kava, valerian -tramadol -tryptophan This list may not describe all possible interactions. Give your health care provider a list of all the medicines, herbs, non-prescription drugs, or dietary supplements you use. Also tell them if you smoke, drink alcohol, or use illegal drugs. Some items may interact with your medicine. What should I watch for while using this medicine? Tell your doctor if your symptoms do not get better or if they get worse. Visit your doctor or health care professional for regular checks on your progress. Because it may take several weeks to see the full effects of this medicine, it is important to continue your treatment as prescribed by your doctor. Patients and their families should watch out for new or worsening thoughts of suicide or depression. Also watch out for sudden changes in feelings such as feeling anxious, agitated, panicky, irritable, hostile, aggressive, impulsive, severely restless, overly excited and hyperactive, or not being able to sleep. If this happens, especially at the beginning of treatment or after a change in dose, call your health care professional. Dennis Bast may get drowsy or dizzy. Do not drive, use machinery, or do anything that needs mental alertness until you know how this medicine affects you. Do not stand or sit up quickly, especially if you are an older patient. This reduces the risk of dizzy or fainting spells. Alcohol may interfere with the effect  of this medicine. Avoid alcoholic drinks. This medicine may cause dry eyes and blurred vision. If you wear contact lenses you may feel some discomfort. Lubricating drops may help. See your eye doctor if the problem does not go away or is severe. Your mouth may get dry. Chewing sugarless gum, sucking hard candy and drinking plenty of water may help. Contact your doctor if the problem does not go away or is severe. What side effects may I notice from receiving this medicine? Side effects that you should report to your doctor or health care professional as soon as possible: -allergic reactions like skin rash, itching or hives, swelling of the face, lips, or tongue -elevated mood, decreased need for sleep, racing thoughts, impulsive behavior -confusion -fast, irregular heartbeat -feeling faint or lightheaded, falls -feeling agitated, angry, or irritable -loss of balance or coordination -painful or prolonged erections -restlessness, pacing, inability to keep still -suicidal thoughts or other mood changes -tremors -trouble sleeping -seizures -unusual bleeding or bruising Side effects that usually do not require medical attention (report to your doctor or health care professional if they continue or are bothersome): -change in sex drive or performance -change in appetite or weight -constipation -headache -muscle aches or pains -nausea This list may not describe all possible side effects. Call your doctor for medical advice about side effects. You  may report side effects to FDA at 1-800-FDA-1088. Where should I keep my medicine? Keep out of the reach of children. Store at room temperature between 15 and 30 degrees C (59 to 86 degrees F). Protect from light. Keep container tightly closed. Throw away any unused medicine after the expiration date. NOTE: This sheet is a summary. It may not cover all possible information. If you have questions about this medicine, talk to your doctor, pharmacist, or  health care provider.  2018 Elsevier/Gold Standard (2015-10-16 16:57:05)

## 2018-05-11 ENCOUNTER — Telehealth: Payer: Self-pay

## 2018-05-11 LAB — CBC WITH DIFFERENTIAL/PLATELET
Basophils Absolute: 144 cells/uL (ref 0–200)
Basophils Relative: 0.8 %
EOS ABS: 342 {cells}/uL (ref 15–500)
Eosinophils Relative: 1.9 %
HCT: 39.9 % (ref 35.0–45.0)
Hemoglobin: 12.5 g/dL (ref 11.7–15.5)
LYMPHS ABS: 5940 {cells}/uL — AB (ref 850–3900)
MCH: 19.2 pg — AB (ref 27.0–33.0)
MCHC: 31.3 g/dL — ABNORMAL LOW (ref 32.0–36.0)
MCV: 61.2 fL — AB (ref 80.0–100.0)
MPV: 9.9 fL (ref 7.5–12.5)
Monocytes Relative: 4.5 %
Neutro Abs: 10764 cells/uL — ABNORMAL HIGH (ref 1500–7800)
Neutrophils Relative %: 59.8 %
PLATELETS: 461 10*3/uL — AB (ref 140–400)
RBC: 6.52 10*6/uL — AB (ref 3.80–5.10)
RDW: 18.4 % — AB (ref 11.0–15.0)
TOTAL LYMPHOCYTE: 33 %
WBC mixed population: 810 cells/uL (ref 200–950)
WBC: 18 10*3/uL — AB (ref 3.8–10.8)

## 2018-05-11 LAB — COMPLETE METABOLIC PANEL WITH GFR
AG RATIO: 1.4 (calc) (ref 1.0–2.5)
ALBUMIN MSPROF: 4.5 g/dL (ref 3.6–5.1)
ALKALINE PHOSPHATASE (APISO): 91 U/L (ref 33–130)
ALT: 11 U/L (ref 6–29)
AST: 14 U/L (ref 10–35)
BILIRUBIN TOTAL: 0.5 mg/dL (ref 0.2–1.2)
BUN: 13 mg/dL (ref 7–25)
CHLORIDE: 99 mmol/L (ref 98–110)
CO2: 30 mmol/L (ref 20–32)
CREATININE: 0.75 mg/dL (ref 0.50–0.99)
Calcium: 10.3 mg/dL (ref 8.6–10.4)
GFR, EST AFRICAN AMERICAN: 98 mL/min/{1.73_m2} (ref >=60)
GFR, Est Non African American: 84 mL/min/{1.73_m2} (ref >=60)
GLOBULIN: 3.3 g/dL (ref 1.9–3.7)
GLUCOSE: 145 mg/dL — AB (ref 65–99)
POTASSIUM: 4.9 mmol/L (ref 3.5–5.3)
SODIUM: 136 mmol/L (ref 135–146)
Total Protein: 7.8 g/dL (ref 6.1–8.1)

## 2018-05-11 LAB — HEMOGLOBIN A1C
EAG (MMOL/L): 11.6 (calc)
Hgb A1c MFr Bld: 8.9 % of total Hgb — ABNORMAL HIGH (ref <5.7)
Mean Plasma Glucose: 209 (calc)

## 2018-05-11 NOTE — Telephone Encounter (Signed)
Pharmacy needs clarification of trazodone prescription.

## 2018-05-12 ENCOUNTER — Other Ambulatory Visit: Payer: Self-pay

## 2018-05-12 MED ORDER — TRAZODONE HCL 150 MG PO TABS: ORAL_TABLET | ORAL | 2 refills | Status: DC

## 2018-05-12 NOTE — Telephone Encounter (Signed)
Trazodone prescription clarification requested by pharmacy.

## 2018-05-12 NOTE — Telephone Encounter (Signed)
Re-sent to pharmacy.

## 2018-05-18 ENCOUNTER — Other Ambulatory Visit: Payer: Self-pay | Admitting: Internal Medicine

## 2018-06-29 ENCOUNTER — Other Ambulatory Visit: Payer: Self-pay | Admitting: Adult Health

## 2018-07-31 ENCOUNTER — Other Ambulatory Visit: Payer: Self-pay | Admitting: Adult Health

## 2018-07-31 DIAGNOSIS — F3341 Major depressive disorder, recurrent, in partial remission: Secondary | ICD-10-CM

## 2018-08-24 ENCOUNTER — Ambulatory Visit: Payer: Self-pay | Admitting: Adult Health

## 2018-08-25 ENCOUNTER — Ambulatory Visit: Payer: Self-pay | Admitting: Oncology

## 2018-09-01 ENCOUNTER — Other Ambulatory Visit: Payer: Self-pay | Admitting: Adult Health

## 2018-09-01 DIAGNOSIS — F3341 Major depressive disorder, recurrent, in partial remission: Secondary | ICD-10-CM

## 2018-09-02 ENCOUNTER — Other Ambulatory Visit: Payer: Self-pay | Admitting: Adult Health

## 2018-09-02 DIAGNOSIS — F3341 Major depressive disorder, recurrent, in partial remission: Secondary | ICD-10-CM

## 2018-09-02 MED ORDER — CITALOPRAM HYDROBROMIDE 40 MG PO TABS: 40.0000 mg | ORAL_TABLET | Freq: Every day | ORAL | 0 refills | Status: DC

## 2018-09-27 ENCOUNTER — Ambulatory Visit: Payer: Self-pay | Admitting: Adult Health

## 2018-10-02 NOTE — Progress Notes (Signed)
FOLLOW UP  Assessment and Plan:   Hypertension Well controlled without medications Monitor blood pressure at home; patient to call if consistently greater than 130/80 Continue DASH diet.   Reminder to go to the ER if any CP, SOB, nausea, dizziness, severe HA, changes vision/speech, left arm numbness and tingling and jaw pain.  Cholesterol Currently above goal; taking atorvastatin 40 mg daily, improved compliance recently reported Continue low cholesterol diet and exercise.  Check lipid panel.   Diabetes with other diabetic neurologic complication Overall poorly compliant and very poor control Continue medication:  currently taking metformin 500 mg 1-2 tabs daily- discussed increasing meals during the day, small, frequent, aim for 4-5 small meals of protein+healthy fat+fiber, gradually increase metformin every 2 weeks as tolerated, reminded GI sx should improve after this duration,  - declines adding another medication Continue diet and exercise.  Perform daily foot/skin check, notify office of any concerning changes.  Check A1C  BMI 27 Long discussion about weight loss, diet, and exercise Recommended diet heavy in fruits and veggies and low in animal meats, cheeses, and dairy products, appropriate calorie intake Discussed ideal weight for height  Will follow up in 3 months  Vitamin D Def Taking 5000 IU semi-regularly  Checking Vit D level today with patient permission  Insomnia Improved with seroquel; didn't do well with trazodone Insomnia- good sleep hygiene discussed, increase day time activity  Bilateral feet arthralgia Declines new referral to ortho/podiatry Mobic sent in to start taking 7.5-15 mg daily  Recommended shoes with better support - avoid shoes without padding/thick soles Suggested trial of tumeric+bioprene, chondroitin/glucosamine supplements x 8-12 weeks  Constipation - Increase fiber/ water intake, decrease caffeine, increase activity level, declines  medications but has miralax to add if needed. Laboratory tests per orders. Please go to the hospital if you have severe abdominal pain, vomiting, fever, CP, SOB.    Continue diet and meds as discussed. Further disposition pending results of labs. Discussed med's effects and SE's.   Over 30 minutes of exam, counseling, chart review, and critical decision making was performed.   Future Appointments  Date Time Provider [place]  11/24/2018  3:00 PM Wyatt Portela, MD [facility] None    ----------------------------------------------------------------------------------------------------------------------  HPI 63 y.o. female, cash pay, presents for 3 month follow up on hypertension, cholesterol, diabetes, weight and vitamin D deficiency. She has hx of colon cancer in 2010, s/p resection and established with [name]; she has had unexplained leukocytosis and microcytosis documented since 2010.  Etiology remains unclear with possible myeloproliferative disorder and was referred back to Oncology for evaluation and has been found to be stable.  she has a diagnosis of depression and is currently on celexa 40 mg daily, reports symptoms are well controlled on current regimen. she additionally has insomnia, taking seroquel 25 mg at night.   She has R foot arthritis per [facility]; she reports was given no recommendations, tylenol/ibuprofen have not been particularly helpful. She reports pain worst through sole/arch, bilateral feet.   BMI is Body mass index is 28.31 kg/m., she has been working on diet, is not doing much exercise recently since breaking her toe (in 07/2018). She is still drinking a some diet soda, not daily, admits to not drinking much water. She admits eating only 1 meal a day, typically has sandwich with multigrain bread, crackers, etc, and will eat fruit.  Wt Readings from Last 3 Encounters:  10/04/18 159 lb 12.8 oz (72.5 kg)  05/10/18 163 lb (73.9 kg)  02/24/18  156 lb 1.6 oz (70.8 kg)   Her blood pressure has been controlled at home, today their BP is BP: (!) 112/58  She does not workout. She denies chest pain, shortness of breath, dizziness.   She is on cholesterol medication (atorvastatin 40 mg most days) and denies myalgias. Her cholesterol is not at goal. The cholesterol last visit was:   Lab Results  Component Value Date   CHOL 262 (H) 01/25/2018   HDL 32 (L) 01/25/2018   [place]  01/25/2018     Comment:     . LDL cholesterol not calculated. Triglyceride levels greater than 400 mg/dL invalidate calculated LDL results. . Reference range: <100 . Desirable range <100 mg/dL for primary prevention;   <70 mg/dL for patients with CHD or diabetic patients  with > or = 2 CHD risk factors. Marland Kitchen LDL-C is now calculated using the [name]  calculation, which is a validated novel method providing  better accuracy than the Friedewald equation in the  estimation of LDL-C.  [name] et al. [name]. 8841;660(63): 2061-2068  (http://education.[employer].com/faq/FAQ164)    TRIG 497 (H) 01/25/2018   CHOLHDL 8.2 (H) 01/25/2018    She has been working on diet and diabetes, and denies foot ulcerations, increased appetite, nausea, polydipsia, polyuria, vomiting and weight loss. She does not currently take sugars at home, refuses to do so. She has reported intolerance to multiple oral agents and has been poorly compliant with therapy, and is poorly compliant with dietary recommendations as well. Consequently A1Cs have been very poorly controlled. Current prescribed metformin which she does report has been taking 500 mg once or twice daily consistently, tolerating well after initial mild GI symptoms.  Last A1C in the office was:  Lab Results  Component Value Date   HGBA1C 8.9 (H) 05/10/2018   Patient is on Vitamin D supplement, Taking 5000 IU dialy   Lab Results  Component Value Date   VD25OH 10 (L) 08/27/2014        Current Medications:   Current Outpatient Medications on File Prior to Visit  Medication Sig  . atorvastatin (LIPITOR) 80 MG tablet Take 1/2 to tablet daily for Cholesterol or as directed  . CHOLECALCIFEROL PO Take 5,000 Units by mouth daily.  . citalopram (CELEXA) 40 MG tablet Take 1 tablet (40 mg total) by mouth daily.  Marland Kitchen gabapentin (NEURONTIN) 300 MG capsule TAKE 1 TO 3 CAPSULES BY MOUTH NIGHTLY FOR PAIN  . metFORMIN (GLUCOPHAGE-XR) 500 MG 24 hr tablet Take 1 to 2 tablets 2 x /day with meals as directed for Diabetes (Patient taking differently: Take one tablet once daily)  . promethazine (PHENERGAN) 25 MG tablet Take 1 tablet (25 mg total) by mouth every 6 (six) hours as needed for nausea or vomiting.  Marland Kitchen QUEtiapine (SEROQUEL) 25 MG tablet TAKE 1 TABLET BY MOUTH AT BEDTIME  . traZODone (DESYREL) 150 MG tablet Start by taking 1/3 tablet at bedtime and increase gradually if needed to one full tablet. (Patient not taking: Reported on 10/04/2018)   No current facility-administered medications on file prior to visit.      Allergies:  Allergies  Allergen Reactions  . Codeine   . Glipizide Nausea And Vomiting  . Lunesta [Eszopiclone]     Excessive fatigue  . Welchol [Colesevelam Hcl]     nausea      Medical History:  Past Medical History:  Diagnosis Date  . Anxiety   . Cervical dysplasia   . Colon cancer ([place])   . Depression   .  Diabetes ([place])   . Hyperlipidemia   . Vitamin D deficiency    Family history- Reviewed and unchanged Social history- Reviewed and unchanged   Review of Systems:  Review of Systems  Constitutional: Negative for malaise/fatigue and weight loss.  HENT: Negative for hearing loss and tinnitus.   Eyes: Negative for blurred vision and double vision.  Respiratory: Negative for cough, shortness of breath and wheezing.   Cardiovascular: Negative for chest pain, palpitations, orthopnea, claudication and leg swelling.  Gastrointestinal: Positive for constipation (every 3-4 days).  Negative for abdominal pain, blood in stool, diarrhea, heartburn, melena, nausea and vomiting.  Genitourinary: Negative.   Musculoskeletal: Positive for joint pain (bilateral feet). Negative for myalgias.  Skin: Negative for rash.  Neurological: Negative for dizziness, tingling, sensory change, weakness and headaches.  Endo/Heme/Allergies: Negative for polydipsia.  Psychiatric/Behavioral: Negative.   All other systems reviewed and are negative.   Physical Exam: BP (!) 112/58   Pulse 88   Temp (!) 96.6 F (35.9 C)   Ht 5\' 3"  (1.6 m)   Wt 159 lb 12.8 oz (72.5 kg)   SpO2 98%   BMI 28.31 kg/m  Wt Readings from Last 3 Encounters:  10/04/18 159 lb 12.8 oz (72.5 kg)  05/10/18 163 lb (73.9 kg)  02/24/18 156 lb 1.6 oz (70.8 kg)   General Appearance: Well nourished, in no apparent distress. Eyes: PERRLA, EOMs, conjunctiva no swelling or erythema Sinuses: No Frontal/maxillary tenderness ENT/Mouth: Ext aud canals clear, TMs without erythema, bulging. No erythema, swelling, or exudate on post pharynx.  Tonsils not swollen or erythematous. Hearing normal.  Neck: Supple, thyroid normal.  Respiratory: Respiratory effort normal, BS equal bilaterally without rales, rhonchi, wheezing or stridor.  Cardio: RRR with no MRGs. Brisk peripheral pulses without edema.  Abdomen: Soft, + BS.  Non tender, no guarding, rebound, hernias, masses. Lymphatics: Non tender without lymphadenopathy.  Musculoskeletal: Full ROM, 5/5 strength, Normal gait Skin: Warm, dry without rashes, lesions, ecchymosis.  Neuro: Cranial nerves intact. No cerebellar symptoms.  Psych: Awake and oriented X 3, normal affect, Insight and Judgment appropriate.    Izora Ribas, NP 2:47 PM [facility]

## 2018-10-04 ENCOUNTER — Ambulatory Visit (INDEPENDENT_AMBULATORY_CARE_PROVIDER_SITE_OTHER): Payer: Self-pay | Admitting: Adult Health

## 2018-10-04 ENCOUNTER — Encounter: Payer: Self-pay | Admitting: Adult Health

## 2018-10-04 ENCOUNTER — Other Ambulatory Visit: Payer: Self-pay

## 2018-10-04 VITALS — BP 112/58 | HR 88 | Temp 96.6°F | Ht 63.0 in | Wt 159.8 lb

## 2018-10-04 DIAGNOSIS — N181 Chronic kidney disease, stage 1: Secondary | ICD-10-CM

## 2018-10-04 DIAGNOSIS — F419 Anxiety disorder, unspecified: Secondary | ICD-10-CM

## 2018-10-04 DIAGNOSIS — Z79899 Other long term (current) drug therapy: Secondary | ICD-10-CM

## 2018-10-04 DIAGNOSIS — E663 Overweight: Secondary | ICD-10-CM

## 2018-10-04 DIAGNOSIS — D72829 Elevated white blood cell count, unspecified: Secondary | ICD-10-CM

## 2018-10-04 DIAGNOSIS — M25571 Pain in right ankle and joints of right foot: Secondary | ICD-10-CM | POA: Insufficient documentation

## 2018-10-04 DIAGNOSIS — M25572 Pain in left ankle and joints of left foot: Secondary | ICD-10-CM

## 2018-10-04 DIAGNOSIS — E1169 Type 2 diabetes mellitus with other specified complication: Secondary | ICD-10-CM

## 2018-10-04 DIAGNOSIS — E785 Hyperlipidemia, unspecified: Secondary | ICD-10-CM

## 2018-10-04 DIAGNOSIS — Z794 Long term (current) use of insulin: Secondary | ICD-10-CM

## 2018-10-04 DIAGNOSIS — E1122 Type 2 diabetes mellitus with diabetic chronic kidney disease: Secondary | ICD-10-CM

## 2018-10-04 DIAGNOSIS — F329 Major depressive disorder, single episode, unspecified: Secondary | ICD-10-CM

## 2018-10-04 DIAGNOSIS — E559 Vitamin D deficiency, unspecified: Secondary | ICD-10-CM

## 2018-10-04 DIAGNOSIS — F32A Depression, unspecified: Secondary | ICD-10-CM

## 2018-10-04 MED ORDER — MELOXICAM 15 MG PO TABS: ORAL_TABLET | ORAL | 1 refills | Status: DC

## 2018-10-04 NOTE — Patient Instructions (Addendum)
HOW TO SCHEDULE A MAMMOGRAM  [facility] Offers a $200 mammogram for people without insurance   Schedule an appointment by calling [phone].   Get on a daily tumeric + black pepper fruit (bioprene) supplement  Start on daily chondroitin/glucosamine supplement for joint health    Aim to eat 4-5 small meals daily - start early in the day   Start with drinking something warm first  Then after 30 min - try your first meal   Have protein (lean meat, fish, nuts, or beans) + healthy fat (nuts, seeds, avocado, olive oil, olives) + fiber (beans, whole grain, veggies, fruit)  Do your best to slowly increase metformin - slowly go to 2 tabs, then 3, 4 etc every few weeks      Bad carbs also include fruit juice, alcohol, and sweet tea. These are empty calories that do not signal to your brain that you are full.   Please remember the good carbs are still carbs which convert into sugar. So please measure them out no more than 1/2-1 cup of rice, oatmeal, pasta, and beans  Veggies are however free foods! Pile them on.   Not all fruit is created equal. Please see the list below, the fruit at the bottom is higher in sugars than the fruit at the top. Please avoid all dried fruits.        Constipation, Adult Constipation is when a person has fewer bowel movements in a week than normal, has difficulty having a bowel movement, or has stools that are dry, hard, or larger than normal. Constipation may be caused by an underlying condition. It may become worse with age if a person takes certain medicines and does not take in enough fluids. Follow these instructions at home: Eating and drinking   Eat foods that have a lot of fiber, such as fresh fruits and vegetables, whole grains, and beans.  Limit foods that are high in fat, low in fiber, or overly processed, such as french fries, hamburgers, cookies, candies, and soda.  Drink enough fluid to keep your urine clear or pale  yellow. General instructions  Exercise regularly or as told by your health care provider.  Go to the restroom when you have the urge to go. Do not hold it in.  Take over-the-counter and prescription medicines only as told by your health care provider. These include any fiber supplements.  Practice pelvic floor retraining exercises, such as deep breathing while relaxing the lower abdomen and pelvic floor relaxation during bowel movements.  Watch your condition for any changes.  Keep all follow-up visits as told by your health care provider. This is important. Contact a health care provider if:  You have pain that gets worse.  You have a fever.  You do not have a bowel movement after 4 days.  You vomit.  You are not hungry.  You lose weight.  You are bleeding from the anus.  You have thin, pencil-like stools. Get help right away if:  You have a fever and your symptoms suddenly get worse.  You leak stool or have blood in your stool.  Your abdomen is bloated.  You have severe pain in your abdomen.  You feel dizzy or you faint. This information is not intended to replace advice given to you by your health care provider. Make sure you discuss any questions you have with your health care provider. Document Released: 02/13/2004 Document Revised: 12/05/2015 Document Reviewed: 11/05/2015 Elsevier Interactive Patient Education  2019 [employer].

## 2018-10-05 ENCOUNTER — Other Ambulatory Visit: Payer: Self-pay | Admitting: Adult Health

## 2018-10-05 ENCOUNTER — Encounter: Payer: Self-pay | Admitting: Adult Health

## 2018-10-05 DIAGNOSIS — Z9114 Patient's other noncompliance with medication regimen: Secondary | ICD-10-CM | POA: Insufficient documentation

## 2018-10-05 LAB — LIPID PANEL
Cholesterol: 237 mg/dL — ABNORMAL HIGH (ref <200)
HDL: 31 mg/dL — ABNORMAL LOW (ref >=50)
Non-HDL Cholesterol (Calc): 206 mg/dL (calc) — ABNORMAL HIGH (ref <130)
Total CHOL/HDL Ratio: 7.6 (calc) — ABNORMAL HIGH (ref <5.0)
Triglycerides: 675 mg/dL — ABNORMAL HIGH (ref <150)

## 2018-10-05 LAB — CBC WITH DIFFERENTIAL/PLATELET
Absolute Monocytes: 597 cells/uL (ref 200–950)
Basophils Absolute: 127 cells/uL (ref 0–200)
Basophils Relative: 0.7 %
Eosinophils Absolute: 308 cells/uL (ref 15–500)
Eosinophils Relative: 1.7 %
HCT: 38.4 % (ref 35.0–45.0)
Hemoglobin: 12.3 g/dL (ref 11.7–15.5)
Lymphs Abs: 4887 cells/uL — ABNORMAL HIGH (ref 850–3900)
MCH: 19.6 pg — ABNORMAL LOW (ref 27.0–33.0)
MCHC: 32 g/dL (ref 32.0–36.0)
MCV: 61.1 fL — ABNORMAL LOW (ref 80.0–100.0)
MPV: 10.9 fL (ref 7.5–12.5)
Monocytes Relative: 3.3 %
Neutro Abs: 12181 cells/uL — ABNORMAL HIGH (ref 1500–7800)
Neutrophils Relative %: 67.3 %
Platelets: 480 10*3/uL — ABNORMAL HIGH (ref 140–400)
RBC: 6.28 10*6/uL — ABNORMAL HIGH (ref 3.80–5.10)
RDW: 17.8 % — ABNORMAL HIGH (ref 11.0–15.0)
Total Lymphocyte: 27 %
WBC: 18.1 10*3/uL — ABNORMAL HIGH (ref 3.8–10.8)

## 2018-10-05 LAB — COMPLETE METABOLIC PANEL WITH GFR
AG Ratio: 1.8 (calc) (ref 1.0–2.5)
ALT: 10 U/L (ref 6–29)
AST: 10 U/L (ref 10–35)
Albumin: 4.4 g/dL (ref 3.6–5.1)
Alkaline phosphatase (APISO): 89 U/L (ref 37–153)
BUN: 10 mg/dL (ref 7–25)
CO2: 26 mmol/L (ref 20–32)
Calcium: 9.7 mg/dL (ref 8.6–10.4)
Chloride: 100 mmol/L (ref 98–110)
Creat: 0.68 mg/dL (ref 0.50–0.99)
GFR, Est African American: 107 mL/min/{1.73_m2} (ref >=60)
GFR, Est Non African American: 92 mL/min/{1.73_m2} (ref >=60)
Globulin: 2.4 g/dL (calc) (ref 1.9–3.7)
Glucose, Bld: 208 mg/dL — ABNORMAL HIGH (ref 65–99)
Potassium: 4.5 mmol/L (ref 3.5–5.3)
Sodium: 137 mmol/L (ref 135–146)
Total Bilirubin: 0.4 mg/dL (ref 0.2–1.2)
Total Protein: 6.8 g/dL (ref 6.1–8.1)

## 2018-10-05 LAB — MAGNESIUM: Magnesium: 2 mg/dL (ref 1.5–2.5)

## 2018-10-05 LAB — HEMOGLOBIN A1C
Hgb A1c MFr Bld: 9.3 % of total Hgb — ABNORMAL HIGH (ref <5.7)
Mean Plasma Glucose: 220 (calc)
eAG (mmol/L): 12.2 (calc)

## 2018-10-05 LAB — VITAMIN D 25 HYDROXY (VIT D DEFICIENCY, FRACTURES): Vit D, 25-Hydroxy: 10 ng/mL — ABNORMAL LOW (ref 30–100)

## 2018-10-05 LAB — TSH: TSH: 3.25 mIU/L (ref 0.40–4.50)

## 2018-10-05 MED ORDER — ROSUVASTATIN CALCIUM 40 MG PO TABS: 40.0000 mg | ORAL_TABLET | Freq: Every day | ORAL | 1 refills | Status: DC

## 2018-10-10 ENCOUNTER — Other Ambulatory Visit: Payer: Self-pay | Admitting: Adult Health

## 2018-10-11 ENCOUNTER — Other Ambulatory Visit: Payer: Self-pay | Admitting: Adult Health

## 2018-10-11 DIAGNOSIS — G4700 Insomnia, unspecified: Secondary | ICD-10-CM

## 2018-10-11 MED ORDER — ALPRAZOLAM 0.5 MG PO TABS: 0.5000 mg | ORAL_TABLET | Freq: Every evening | ORAL | 0 refills | Status: DC | PRN

## 2018-11-02 ENCOUNTER — Other Ambulatory Visit: Payer: Self-pay | Admitting: Adult Health

## 2018-11-20 ENCOUNTER — Other Ambulatory Visit: Payer: Self-pay | Admitting: Adult Health

## 2018-11-20 MED ORDER — METFORMIN HCL 500 MG PO TABS: ORAL_TABLET | ORAL | 1 refills | Status: DC

## 2018-11-24 ENCOUNTER — Ambulatory Visit: Payer: Self-pay | Admitting: Oncology

## 2018-12-06 ENCOUNTER — Other Ambulatory Visit: Payer: Self-pay | Admitting: Adult Health

## 2018-12-06 DIAGNOSIS — F3341 Major depressive disorder, recurrent, in partial remission: Secondary | ICD-10-CM

## 2018-12-18 ENCOUNTER — Other Ambulatory Visit: Payer: Self-pay

## 2018-12-18 ENCOUNTER — Encounter: Payer: Self-pay | Admitting: Adult Health

## 2018-12-18 ENCOUNTER — Ambulatory Visit: Payer: Self-pay | Admitting: Adult Health

## 2018-12-18 VITALS — Temp 98.7°F

## 2018-12-18 DIAGNOSIS — Z20822 Contact with and (suspected) exposure to covid-19: Secondary | ICD-10-CM

## 2018-12-18 DIAGNOSIS — G510 Bell's palsy: Secondary | ICD-10-CM

## 2018-12-18 DIAGNOSIS — Z20828 Contact with and (suspected) exposure to other viral communicable diseases: Secondary | ICD-10-CM

## 2018-12-18 DIAGNOSIS — J069 Acute upper respiratory infection, unspecified: Secondary | ICD-10-CM

## 2018-12-18 MED ORDER — PREDNISONE 20 MG PO TABS
ORAL_TABLET | ORAL | 0 refills | Status: AC
Start: 2018-12-18 — End: 2018-12-29

## 2018-12-18 NOTE — Progress Notes (Signed)
Virtual Visit via Telephone Note  I connected with Dorothy Boyd on 12/18/18 at 11:00 AM EDT by telephone and verified that I am speaking with the correct person using two identifiers.  Location: Patient: parking lot Provider: [place] office   I discussed the limitations, risks, security and privacy concerns of performing an evaluation and management service by telephone and the availability of in person appointments. I also discussed with the patient that there may be a patient responsible charge related to this service. The patient expressed understanding and agreed to proceed.   History of Present Illness:  Temp 98.7 F (37.1 C)    63 y.o. female, self pay, smoker with T2DM calls to report R sided facial paralysis that she woke up with yesterday AM, concerned about possible Bell's Palsy; she reports entire R side is involved, "nothing moves" including forehead. She was evaluated via telephone due to reported symptoms concerning for possible covid 19.   She denies vision changes, does endorse dry eye as she isn't blinking on that side, dizziness, numbness/tingling (other than behind R ear reports mild tingling sensation), n/v, paraesthesias or weakness of arms of feet, slurring, difficulty swallowing, confusion, falls.  Denies thunderclap headache, persistent HA. She denies rashes or known tick contact, doesn't have pets, hasn't been spending time outdoors.   She also reports started having sore throat, mild HA, nausea, occasional non-productive cough, changes in taste/smell, mild and intermittent ongoing for 2 weeks, overall symptoms are stable, not significantly worse or improved. She denies hx of allergies. She reports did have fever up to 101 degrees last week but has been normalized last several days. She doesn't work, is staying at home, no known sick contact. Husband is home - he is truck driver, he is asymptomatic.   She doesn't have BP to report today but reports recent home values  have been typical, not atypically elevated.    Current Outpatient Medications on File Prior to Visit  Medication Sig Dispense Refill  . ALPRAZolam (XANAX) 0.5 MG tablet Take 1 tablet (0.5 mg total) by mouth at bedtime as needed for anxiety or sleep. 30 tablet 0  . CHOLECALCIFEROL PO Take 5,000 Units by mouth daily.    . citalopram (CELEXA) 40 MG tablet Take 1 tablet (40 mg total) by mouth daily. 90 tablet 0  . gabapentin (NEURONTIN) 300 MG capsule TAKE 1 TO 3 CAPSULES BY MOUTH NIGHTLY FOR PAIN 90 capsule 1  . metFORMIN (GLUCOPHAGE) 500 MG tablet Take 4 tabs daily with meals; spread over the course of the day, take 2 tabs with largest meal. 360 tablet 1  . promethazine (PHENERGAN) 25 MG tablet Take 1 tablet (25 mg total) by mouth every 6 (six) hours as needed for nausea or vomiting. 30 tablet 3  . QUEtiapine (SEROQUEL) 25 MG tablet TAKE 1 TABLET BY MOUTH AT BEDTIME 90 tablet 0  . rosuvastatin (CRESTOR) 40 MG tablet Take 1 tablet (40 mg total) by mouth daily. 90 tablet 1  . traZODone (DESYREL) 150 MG tablet Start by taking 1/3 tablet at bedtime and increase gradually if needed to one full tablet. (Patient not taking: Reported on 10/04/2018) 30 tablet 2   No current facility-administered medications on file prior to visit.     Allergies:  Allergies  Allergen Reactions  . Codeine   . Glipizide Nausea And Vomiting  . Lunesta [Eszopiclone]     Excessive fatigue  . Welchol [Colesevelam Hcl]     nausea    Medical History:  has Leukocytosis;  History of colon cancer, stage II; Hyperlipidemia associated with type 2 diabetes mellitus ([place]); Diabetes ([place]); Colon cancer ([place]); Vitamin D deficiency; Anxiety; Depression; Overweight (BMI 25.0-29.9); Arthralgia of both feet; and Noncompliance with medication regimen on their problem list. Surgical History:  She  has a past surgical history that includes Appendectomy. Family History:  Herfamily history includes Cancer in her mother; Diabetes in her  sister; Heart disease in her brother; Hyperlipidemia in her mother and sister; Hypertension in her mother. Social History:   reports that she has been smoking. She has never used smokeless tobacco. She reports that she does not drink alcohol or use drugs.     Observations/Objective:  General : Well sounding patient in no apparent distress HEENT: no hoarseness, no cough for duration of visit Lungs: speaks in complete sentences, no audible wheezing, no apparent distress Neurological: alert, oriented x 3, no slurring Psychiatric: pleasant, judgement appropriate    Assessment and Plan:  Diagnoses and all orders for this visit:  Facial paralysis on right side/Right-sided Bell's palsy Patient reports Forehead involvement suggestive of peripheral etiology, most likely Bell's palsy She is low risk for HIV though has never been screened due to self pay status No know tick exposure, low risk history for Lyme's After discussion, will proceed with steroid taper Reviewed eye care; advised eye drops q4 hours, to tape eye closed until symptoms improve Follow up daily via mychart with reports on symptom progression/improvement Will go to the ER if worsening headache, changes vision/speech, imbalance, weakness. -     predniSONE (DELTASONE) 20 MG tablet; 3 tablets daily with food for 7 days, 2 tabs daily for 3 days, 1 tab a day for 5 days.  URI with cough and congestion URI versus COVID Currently mild symptoms, and low risk patient.  Given information for [place] [address] through screening Suggested symptomatic OTC remedies. Nasal saline spray for congestion. Nasal steroids, allergy pill, staying hydrated Follow up as needed via mychart or text.. Will do self isolation at home Patient understands will not break quarantine without my permission.     Follow Up Instructions:    I discussed the assessment and treatment plan with the patient. The patient was provided an opportunity to ask  questions and all were answered. The patient agreed with the plan and demonstrated an understanding of the instructions.   The patient was advised to call back or seek an in-person evaluation if the symptoms worsen or if the condition fails to improve as anticipated.  I provided 20 minutes of non-face-to-face time during this encounter.   Izora Ribas, NP

## 2018-12-21 LAB — NOVEL CORONAVIRUS, NAA: SARS-CoV-2, NAA: NOT DETECTED

## 2018-12-21 LAB — SPECIMEN STATUS REPORT

## 2019-01-03 DIAGNOSIS — G510 Bell's palsy: Secondary | ICD-10-CM

## 2019-01-03 HISTORY — DX: Bell's palsy: G51.0

## 2019-01-03 NOTE — Progress Notes (Deleted)
FOLLOW UP  Assessment and Plan:   Hypertension Well controlled without medications Monitor blood pressure at home; patient to call if consistently greater than 130/80 Continue DASH diet.   Reminder to go to the ER if any CP, SOB, nausea, dizziness, severe HA, changes vision/speech, left arm numbness and tingling and jaw pain.  Cholesterol Currently above goal; taking atorvastatin 40 mg daily, improved compliance recently reported Continue low cholesterol diet and exercise.  Check lipid panel.   Diabetes with other diabetic neurologic complication Overall poorly compliant and very poor control Continue medication:  currently taking metformin 500 mg 1-2 tabs daily- discussed increasing meals during the day, small, frequent, aim for 4-5 small meals of protein+healthy fat+fiber, gradually increase metformin every 2 weeks as tolerated, reminded GI sx should improve after this duration,  - declines adding another medication Continue diet and exercise.  Perform daily foot/skin check, notify office of any concerning changes.  Check A1C  BMI 27 Long discussion about weight loss, diet, and exercise Recommended diet heavy in fruits and veggies and low in animal meats, cheeses, and dairy products, appropriate calorie intake Discussed ideal weight for height  Will follow up in 3 months  Vitamin D Def Taking 5000 IU semi-regularly  Checking Vit D level today with patient permission  Insomnia Improved with seroquel; didn't do well with trazodone Insomnia- good sleep hygiene discussed, increase day time activity  Bilateral feet arthralgia Declines new referral to ortho/podiatry Mobic sent in to start taking 7.5-15 mg daily  Recommended shoes with better support - avoid shoes without padding/thick soles Suggested trial of tumeric+bioprene, chondroitin/glucosamine supplements x 8-12 weeks  Constipation - Increase fiber/ water intake, decrease caffeine, increase activity level, declines  medications but has miralax to add if needed. Laboratory tests per orders. Please go to the hospital if you have severe abdominal pain, vomiting, fever, CP, SOB.    Continue diet and meds as discussed. Further disposition pending results of labs. Discussed med's effects and SE's.   Over 30 minutes of exam, counseling, chart review, and critical decision making was performed.   Future Appointments  Date Time Provider [place]  01/04/2019  3:30 PM Liane Comber, NP [facility] None    ----------------------------------------------------------------------------------------------------------------------  HPI 63 y.o. female, cash pay, presents for 3 month follow up on hypertension, cholesterol, diabetes, weight and vitamin D deficiency. She has hx of colon cancer in 2010, s/p resection and established with [name]; she has had unexplained leukocytosis and microcytosis documented since 2010.  Etiology remains unclear with possible myeloproliferative disorder and was referred back to Oncology for evaluation and has been found to be stable.  She contacted the office on 7/20 to report R sided facial paralysis that she woke up with the day prior, concerned about possible Bell's Palsy; she reported entire R side is involved, "nothing moves" including forehead. She was evaluated via telephone due to reported symptoms concerning for possible covid 19. She denied vision changes,  dizziness, numbness/tingling (other than behind R ear reports mild tingling sensation), n/v, paraesthesias or weakness of arms of feet, slurring, difficulty swallowing, confusion, falls.  Denied thunderclap headache, persistent HA. She denies rashes or known tick contact. Advised likely bell's palsy, was initiated on prednisone taper ***. She did present for drive through covid 19 testing that day which returned negative.   she has a diagnosis of depression and is currently on celexa 40 mg daily, reports symptoms are well  controlled on current regimen. she additionally has insomnia, taking seroquel 25 mg at  night.  Xanax ***  She has R foot arthritis per [facility]; she reports was given no recommendations, tylenol/ibuprofen have not been particularly helpful. She reports pain worst through sole/arch, bilateral feet. She has been prescribed mobid, gabapentin ***  BMI is There is no height or weight on file to calculate BMI., she has been working on diet, is not doing much exercise recently since breaking her toe (in 07/2018). She is still drinking a some diet soda, not daily, admits to not drinking much water. She admits eating only 1 meal a day, typically has sandwich with multigrain bread, crackers, etc, and will eat fruit.  Wt Readings from Last 3 Encounters:  10/04/18 159 lb 12.8 oz (72.5 kg)  05/10/18 163 lb (73.9 kg)  02/24/18 156 lb 1.6 oz (70.8 kg)   Her blood pressure has been controlled at home, today their BP is    She does not workout. She denies chest pain, shortness of breath, dizziness.   She is on cholesterol medication (atorvastatin 40 mg most days) and denies myalgias. Her cholesterol is not at goal. The cholesterol last visit was:   Lab Results  Component Value Date   CHOL 237 (H) 10/04/2018   HDL 31 (L) 10/04/2018   [place]  10/04/2018     Comment:     . LDL cholesterol not calculated. Triglyceride levels greater than 400 mg/dL invalidate calculated LDL results. . Reference range: <100 . Desirable range <100 mg/dL for primary prevention;   <70 mg/dL for patients with CHD or diabetic patients  with > or = 2 CHD risk factors. Marland Kitchen LDL-C is now calculated using the [name]  calculation, which is a validated novel method providing  better accuracy than the Friedewald equation in the  estimation of LDL-C.  [name] et al. [name]. 3235;573(22): 2061-2068  (http://education.[employer].com/faq/FAQ164)    TRIG 675 (H) 10/04/2018   CHOLHDL 7.6 (H) 10/04/2018     She has been working on diet and diabetes, and denies foot ulcerations, increased appetite, nausea, polydipsia, polyuria, vomiting and weight loss. She does not currently take sugars at home, refuses to do so. She has reported intolerance to multiple oral agents and has been poorly compliant with therapy, and is poorly compliant with dietary recommendations as well. Consequently A1Cs have been very poorly controlled. Current prescribed metformin which she does report has been taking 500 mg once or twice daily consistently, tolerating well after initial mild GI symptoms.  Last A1C in the office was:  Lab Results  Component Value Date   HGBA1C 9.3 (H) 10/04/2018    Lab Results  Component Value Date   GFRNONAA 92 10/04/2018   Patient is on Vitamin D supplement, Taking 5000 IU dialy   Lab Results  Component Value Date   VD25OH 10 (L) 10/04/2018        Current Medications:  Current Outpatient Medications on File Prior to Visit  Medication Sig  . ALPRAZolam (XANAX) 0.5 MG tablet Take 1 tablet (0.5 mg total) by mouth at bedtime as needed for anxiety or sleep.  . CHOLECALCIFEROL PO Take 5,000 Units by mouth daily.  . citalopram (CELEXA) 40 MG tablet Take 1 tablet (40 mg total) by mouth daily.  Marland Kitchen gabapentin (NEURONTIN) 300 MG capsule TAKE 1 TO 3 CAPSULES BY MOUTH NIGHTLY FOR PAIN  . metFORMIN (GLUCOPHAGE) 500 MG tablet Take 4 tabs daily with meals; spread over the course of the day, take 2 tabs with largest meal.  . promethazine (PHENERGAN) 25 MG tablet Take  1 tablet (25 mg total) by mouth every 6 (six) hours as needed for nausea or vomiting.  Marland Kitchen QUEtiapine (SEROQUEL) 25 MG tablet TAKE 1 TABLET BY MOUTH AT BEDTIME  . rosuvastatin (CRESTOR) 40 MG tablet Take 1 tablet (40 mg total) by mouth daily.  . traZODone (DESYREL) 150 MG tablet Start by taking 1/3 tablet at bedtime and increase gradually if needed to one full tablet. (Patient not taking: Reported on 10/04/2018)   No current  facility-administered medications on file prior to visit.      Allergies:  Allergies  Allergen Reactions  . Codeine   . Glipizide Nausea And Vomiting  . Lunesta [Eszopiclone]     Excessive fatigue  . Welchol [Colesevelam Hcl]     nausea      Medical History:  Past Medical History:  Diagnosis Date  . Anxiety   . Cervical dysplasia   . Colon cancer ([place])   . Depression   . Diabetes ([place])   . Hyperlipidemia   . Vitamin D deficiency    Family history- Reviewed and unchanged Social history- Reviewed and unchanged   Review of Systems:  Review of Systems  Constitutional: Negative for malaise/fatigue and weight loss.  HENT: Negative for hearing loss and tinnitus.   Eyes: Negative for blurred vision and double vision.  Respiratory: Negative for cough, shortness of breath and wheezing.   Cardiovascular: Negative for chest pain, palpitations, orthopnea, claudication and leg swelling.  Gastrointestinal: Positive for constipation (every 3-4 days). Negative for abdominal pain, blood in stool, diarrhea, heartburn, melena, nausea and vomiting.  Genitourinary: Negative.   Musculoskeletal: Positive for joint pain (bilateral feet). Negative for myalgias.  Skin: Negative for rash.  Neurological: Negative for dizziness, tingling, sensory change, weakness and headaches.  Endo/Heme/Allergies: Negative for polydipsia.  Psychiatric/Behavioral: Negative.   All other systems reviewed and are negative.   Physical Exam: There were no vitals taken for this visit. Wt Readings from Last 3 Encounters:  10/04/18 159 lb 12.8 oz (72.5 kg)  05/10/18 163 lb (73.9 kg)  02/24/18 156 lb 1.6 oz (70.8 kg)   General Appearance: Well nourished, in no apparent distress. Eyes: PERRLA, EOMs, conjunctiva no swelling or erythema Sinuses: No Frontal/maxillary tenderness ENT/Mouth: Ext aud canals clear, TMs without erythema, bulging. No erythema, swelling, or exudate on post pharynx.  Tonsils not swollen or  erythematous. Hearing normal.  Neck: Supple, thyroid normal.  Respiratory: Respiratory effort normal, BS equal bilaterally without rales, rhonchi, wheezing or stridor.  Cardio: RRR with no MRGs. Brisk peripheral pulses without edema.  Abdomen: Soft, + BS.  Non tender, no guarding, rebound, hernias, masses. Lymphatics: Non tender without lymphadenopathy.  Musculoskeletal: Full ROM, 5/5 strength, Normal gait Skin: Warm, dry without rashes, lesions, ecchymosis.  Neuro: Cranial nerves intact. No cerebellar symptoms.  Psych: Awake and oriented X 3, normal affect, Insight and Judgment appropriate.    Izora Ribas, NP 3:48 PM [facility]

## 2019-01-04 ENCOUNTER — Ambulatory Visit: Payer: Self-pay | Admitting: Adult Health

## 2019-01-08 NOTE — Progress Notes (Signed)
FOLLOW UP  Assessment and Plan:   Hypertension Well controlled without medications Monitor blood pressure at home; patient to call if consistently greater than 130/80 Continue DASH diet.   Reminder to go to the ER if any CP, SOB, nausea, dizziness, severe HA, changes vision/speech, left arm numbness and tingling and jaw pain.  Cholesterol Currently above goal; taking rosuvastatin 40 mg occasionally; encouraged compliance even a few days a week She understands risk of MI/Stroke with poorly controlled diabetes, cholesterol and smoking Continue low cholesterol diet and exercise.  Check lipid panel.   Diabetes with other diabetic neurologic complication Overall poorly compliant and very poor control Scared of needles and refuses to check glucose Continue medication:  currently taking metformin 500 mg 1-2 tabs daily- check with pharmacy about resuming XR Start glimepiride 2 mg daily, increase to 4 mg daily in 1 week if tolerating Given 1 week each samples of onglyza 2.5 mg, 5 mg; trajenta 5 mg; jardiance 10 mg Instructed to try 1 tab daily of each for a week and keep a log of how she tolerated and can discuss at next OV Cautioned to not initiate jardiance if A1C worse today - increased risk genital mycotic inf. discussed increasing meals during the day, small, frequent, aim for 4-5 small meals of protein+healthy fat+fiber Continue diet and exercise.  Perform daily foot/skin check, notify office of any concerning changes.  Check A1C  BMI 27 Long discussion about weight loss, diet, and exercise Recommended diet heavy in fruits and veggies and low in animal meats, cheeses, and dairy products, appropriate calorie intake Discussed ideal weight for height  Will follow up in 3 months  Vitamin D Def Taking 5000 IU daily  Check vitamin D annually   Tobacco use Discussed risks associated with tobacco use and advised to reduce or quit Patient is not ready to do so, but advised to consider  strongly Will follow up at the next visit  Insomnia Limited benefit with seroquel though has improved mood and wants to continue; didn't do well with trazodone or gabapentin She reports does well with 0.25 mg PRN xanax; emphasized need to limit to <5 days/week, she feels this is reasonable, will monitor frequency/PDMP closely, no dose increases Insomnia- good sleep hygiene discussed, increase day time activity  Constipation - Increase fiber/ water intake, decrease caffeine, increase activity level, declines medications but has miralax to add if needed. Laboratory tests per orders. Please go to the hospital if you have severe abdominal pain, vomiting, fever, CP, SOB.    Continue diet and meds as discussed. Further disposition pending results of labs. Discussed med's effects and SE's.   Over 30 minutes of exam, counseling, chart review, and critical decision making was performed.   No future appointments.  ----------------------------------------------------------------------------------------------------------------------  HPI 63 y.o. female, cash pay, presents for 3 month follow up on hypertension, cholesterol, diabetes, weight and vitamin D deficiency.   She has hx of colon cancer in 2010, s/p resection and established with [name]; she has had unexplained leukocytosis and microcytosis documented since 2010.  Etiology remains unclear with possible myeloproliferative disorder and was referred back to Oncology for evaluation and has been found to be stable.  She was diagnosed with R sided Bell's Palsy on 12/18/2018; completed steroid taper, back to baseline at this time.   she has a diagnosis of depression and is currently on celexa 40 mg daily, reports symptoms are well controlled on current regimen. she additionally has insomnia, taking seroquel 25 mg at night but  while helps with mood hasn't improved sleep. She tried trazodone 75 mg-150 mg which she reports wasn't effective, gabapentin  300 mg 1-3 caps, didn't help with pain or sleep per patient. She has been on xanax, takes 0.25 mg as needed for sleep.   BMI is Body mass index is 27.78 kg/m., she has been working on diet, she is walking daily, 15 min daily. She is still drinking a some diet soda daily, drinks 2 cups of water daily. She admits eating only 1 meal a day, typically has sandwich with multigrain bread, crackers, etc, and will eat fruit. Very poorly motivated to work on lifestyle. Wt Readings from Last 3 Encounters:  01/10/19 156 lb 12.8 oz (71.1 kg)  10/04/18 159 lb 12.8 oz (72.5 kg)  05/10/18 163 lb (73.9 kg)   Her blood pressure has been controlled at home, today their BP is BP: 112/60  She does not workout. She denies chest pain, shortness of breath, dizziness.   She is on cholesterol medication (rosuvastatin 40 mg but doesn't take frequently as causes GI discomfort - all meds do) and denies myalgias. Her cholesterol is not at goal. The cholesterol last visit was:   Lab Results  Component Value Date   CHOL 237 (H) 10/04/2018   HDL 31 (L) 10/04/2018   [place]  10/04/2018     Comment:     . LDL cholesterol not calculated. Triglyceride levels greater than 400 mg/dL invalidate calculated LDL results. . Reference range: <100 . Desirable range <100 mg/dL for primary prevention;   <70 mg/dL for patients with CHD or diabetic patients  with > or = 2 CHD risk factors. Marland Kitchen LDL-C is now calculated using the [name]  calculation, which is a validated novel method providing  better accuracy than the Friedewald equation in the  estimation of LDL-C.  [name] et al. [name]. 5176;160(73): 2061-2068  (http://education.[facility].com/faq/FAQ164)    TRIG 675 (H) 10/04/2018   CHOLHDL 7.6 (H) 10/04/2018    She has been working on diet and diabetes, and denies foot ulcerations, increased appetite, nausea, polydipsia, polyuria, vomiting and weight loss. She does not currently take sugars at home, refuses to  do so. She has reported intolerance to multiple oral agents and has been poorly compliant with therapy, and is poorly compliant with dietary recommendations as well. Consequently A1Cs have been very poorly controlled. Current prescribed metformin which she does report has been taking 500 mg once daily with diarrhea, increase GI sx since stopping XR with recall. Last A1C in the office was:  Lab Results  Component Value Date   HGBA1C 9.3 (H) 10/04/2018    Lab Results  Component Value Date   GFRNONAA 92 10/04/2018   Patient is on Vitamin D supplement, Taking 5000 IU daily and reports tolerating well.  Lab Results  Component Value Date   VD25OH 10 (L) 10/04/2018       Current Medications:  Current Outpatient Medications on File Prior to Visit  Medication Sig  . CHOLECALCIFEROL PO Take 5,000 Units by mouth daily.  . citalopram (CELEXA) 40 MG tablet Take 1 tablet (40 mg total) by mouth daily.  . metFORMIN (GLUCOPHAGE) 500 MG tablet Take 4 tabs daily with meals; spread over the course of the day, take 2 tabs with largest meal.  . promethazine (PHENERGAN) 25 MG tablet Take 1 tablet (25 mg total) by mouth every 6 (six) hours as needed for nausea or vomiting.  Marland Kitchen QUEtiapine (SEROQUEL) 25 MG tablet TAKE 1 TABLET BY MOUTH AT  BEDTIME  . ALPRAZolam (XANAX) 0.5 MG tablet Take 1 tablet (0.5 mg total) by mouth at bedtime as needed for anxiety or sleep. (Patient not taking: Reported on 01/10/2019)  . gabapentin (NEURONTIN) 300 MG capsule TAKE 1 TO 3 CAPSULES BY MOUTH NIGHTLY FOR PAIN (Patient not taking: Reported on 01/10/2019)  . rosuvastatin (CRESTOR) 40 MG tablet Take 1 tablet (40 mg total) by mouth daily. (Patient not taking: Reported on 01/10/2019)  . traZODone (DESYREL) 150 MG tablet Start by taking 1/3 tablet at bedtime and increase gradually if needed to one full tablet. (Patient not taking: Reported on 10/04/2018)   No current facility-administered medications on file prior to visit.       Allergies:  Allergies  Allergen Reactions  . Codeine   . Glipizide Nausea And Vomiting  . Lunesta [Eszopiclone]     Excessive fatigue  . Welchol [Colesevelam Hcl]     nausea      Medical History:  Past Medical History:  Diagnosis Date  . Anxiety   . Cervical dysplasia   . Colon cancer ([place])   . Depression   . Diabetes ([place])   . Hyperlipidemia   . Vitamin D deficiency    Family history- Reviewed and unchanged Social history- Reviewed and unchanged   Review of Systems:  Review of Systems  Constitutional: Negative for malaise/fatigue and weight loss.  HENT: Negative for hearing loss and tinnitus.   Eyes: Negative for blurred vision and double vision.  Respiratory: Negative for cough, shortness of breath and wheezing.   Cardiovascular: Negative for chest pain, palpitations, orthopnea, claudication and leg swelling.  Gastrointestinal: Positive for diarrhea (with metformin). Negative for abdominal pain, blood in stool, constipation, heartburn, melena, nausea and vomiting.  Genitourinary: Negative.   Musculoskeletal: Positive for joint pain (bilateral feet). Negative for myalgias.  Skin: Negative for rash.  Neurological: Negative for dizziness, tingling, sensory change, weakness and headaches.  Endo/Heme/Allergies: Negative for polydipsia.  Psychiatric/Behavioral: Negative.   All other systems reviewed and are negative.   Physical Exam: BP 112/60   Pulse 82   Temp (!) 97.3 F (36.3 C)   Wt 156 lb 12.8 oz (71.1 kg)   SpO2 96%   BMI 27.78 kg/m  Wt Readings from Last 3 Encounters:  01/10/19 156 lb 12.8 oz (71.1 kg)  10/04/18 159 lb 12.8 oz (72.5 kg)  05/10/18 163 lb (73.9 kg)   General Appearance: Well nourished, in no apparent distress. Eyes: PERRLA, EOMs, conjunctiva no swelling or erythema Sinuses: No Frontal/maxillary tenderness ENT/Mouth: Ext aud canals clear, TMs without erythema, bulging. No erythema, swelling, or exudate on post pharynx.  Tonsils not  swollen or erythematous. Hearing normal.  Neck: Supple, thyroid normal.  Respiratory: Respiratory effort normal, BS equal bilaterally without rales, rhonchi, wheezing or stridor.  Cardio: RRR with no MRGs. Brisk peripheral pulses without edema.  Abdomen: Soft, + BS.  Non tender, no guarding, rebound, hernias, masses. Lymphatics: Non tender without lymphadenopathy.  Musculoskeletal: Full ROM, 5/5 strength, Normal gait Skin: Warm, dry without rashes, lesions, ecchymosis.  Neuro: Cranial nerves intact. No cerebellar symptoms.  Psych: Awake and oriented X 3, normal affect, Insight and Judgment appropriate.    Izora Ribas, NP 4:29 PM [facility]

## 2019-01-10 ENCOUNTER — Ambulatory Visit (INDEPENDENT_AMBULATORY_CARE_PROVIDER_SITE_OTHER): Payer: Self-pay | Admitting: Adult Health

## 2019-01-10 ENCOUNTER — Other Ambulatory Visit: Payer: Self-pay

## 2019-01-10 ENCOUNTER — Encounter: Payer: Self-pay | Admitting: Adult Health

## 2019-01-10 VITALS — BP 112/60 | HR 82 | Temp 97.3°F | Wt 156.8 lb

## 2019-01-10 DIAGNOSIS — F329 Major depressive disorder, single episode, unspecified: Secondary | ICD-10-CM

## 2019-01-10 DIAGNOSIS — Z794 Long term (current) use of insulin: Secondary | ICD-10-CM

## 2019-01-10 DIAGNOSIS — E1169 Type 2 diabetes mellitus with other specified complication: Secondary | ICD-10-CM

## 2019-01-10 DIAGNOSIS — G4700 Insomnia, unspecified: Secondary | ICD-10-CM

## 2019-01-10 DIAGNOSIS — G510 Bell's palsy: Secondary | ICD-10-CM

## 2019-01-10 DIAGNOSIS — E1122 Type 2 diabetes mellitus with diabetic chronic kidney disease: Secondary | ICD-10-CM

## 2019-01-10 DIAGNOSIS — Z9114 Patient's other noncompliance with medication regimen: Secondary | ICD-10-CM

## 2019-01-10 DIAGNOSIS — E559 Vitamin D deficiency, unspecified: Secondary | ICD-10-CM

## 2019-01-10 DIAGNOSIS — F3341 Major depressive disorder, recurrent, in partial remission: Secondary | ICD-10-CM

## 2019-01-10 DIAGNOSIS — E785 Hyperlipidemia, unspecified: Secondary | ICD-10-CM

## 2019-01-10 DIAGNOSIS — F419 Anxiety disorder, unspecified: Secondary | ICD-10-CM

## 2019-01-10 DIAGNOSIS — E663 Overweight: Secondary | ICD-10-CM

## 2019-01-10 DIAGNOSIS — D72829 Elevated white blood cell count, unspecified: Secondary | ICD-10-CM

## 2019-01-10 DIAGNOSIS — N181 Chronic kidney disease, stage 1: Secondary | ICD-10-CM

## 2019-01-10 DIAGNOSIS — F32A Depression, unspecified: Secondary | ICD-10-CM

## 2019-01-10 DIAGNOSIS — C189 Malignant neoplasm of colon, unspecified: Secondary | ICD-10-CM

## 2019-01-10 MED ORDER — ALPRAZOLAM 0.5 MG PO TABS: 0.2500 mg | ORAL_TABLET | Freq: Every evening | ORAL | 0 refills | Status: DC | PRN

## 2019-01-10 MED ORDER — GLIMEPIRIDE 4 MG PO TABS: 2.0000 mg | ORAL_TABLET | ORAL | 1 refills | Status: DC

## 2019-01-10 NOTE — Patient Instructions (Addendum)
Goals    . DIET - EAT MORE FRUITS AND VEGETABLES     Aim for 5-7 servings daily (1/2 cup each)     . DIET - INCREASE WATER INTAKE     Aim to work up to 65+ fluid ounces daily     . Exercise 2-3x per week (30 min per time)    . Weight (lb) < 150 lb (68 kg)        Check with pharmacist about extended release metformin - call if ready to switch back - continue as much as you can tolerate  Try glimepiride - 1/2 tab with your daily meal - if tolerating ok - increase to whole tab in 2 weeks  If tolerating ok - can try adding onglyza (start with 2.5 mg daily x 1 week, then 5 mg daily), then can try trajenta 5 mg daily x 2 weeks, then can try jardiance 10 mg daily x 1 week. Please keep a log of how you feel, if there were any problems and given me a call to let me know.   Do your best to eat smaller meals more frequently, stick to protein and healthy fat and high fiber foods as much as possible.   Limit diet soda, processed foods, sugar, flour, white rice/potatoes as much as possible     Glimepiride tablets What is this medicine? GLIMEPIRIDE (GLYE me pye ride) helps to treat type 2 diabetes. Treatment is combined with diet and exercise. This medicine helps your body use insulin better. This medicine may be used for other purposes; ask your health care provider or pharmacist if you have questions. COMMON BRAND NAME(S): Amaryl What should I tell my health care provider before I take this medicine? They need to know if you have any of these conditions:  diabetic ketoacidosis  glucose-6-phosphate dehydrogenase deficiency  heart disease  kidney disease  liver disease  severe infection or injury  thyroid disease  an unusual or allergic reaction to glimepiride, sulfa drugs, other medicines, foods, dyes, or preservatives  pregnancy or recent attempts to get pregnant  breast-feeding How should I use this medicine? Take this medicine by mouth. Swallow with a drink of water. Follow  the directions on the prescription label. Take your dose at the same time each day, with breakfast or your first large meal. Do not take more often than directed. Talk to your pediatrician regarding the use of this medicine in children. Special care may be needed. Elderly patients over 40 years old can have a stronger reaction and need a smaller dose. Overdosage: If you think you have taken too much of this medicine contact a poison control center or emergency room at once. NOTE: This medicine is only for you. Do not share this medicine with others. What if I miss a dose? If you miss a dose, take it as soon as you can. If it is almost time for your next dose, take only that dose. Do not take double or extra doses. What may interact with this medicine?  bosentan  chloramphenicol  cisapride  clarithromycin  medicines for fungal or yeast infections  metoclopramide  probenecid  warfarin Many medications may cause an increase or decrease in blood sugar, these include:  alcohol containing beverages  aspirin and aspirin-like drugs  chloramphenicol  chromium  female hormones, like estrogens or progestins and birth control pills  fluoxetine  heart medicines like disopyramide  isoniazid  female hormones or anabolic steroids  medicines called MAO Inhibitors like Nardil, Parnate,  Marplan, Eldepryl  medicines for allergies, asthma, cold, or cough  medicines for mental problems  medicines for weight loss  niacin  NSAIDs, medicines for pain and inflammation, like ibuprofen or naproxen  pentamidine  phenytoin  probenecid  quinolone antibiotics like ciprofloxacin, levofloxacin, ofloxacin  some herbal dietary supplements  steroid medicines like prednisone or cortisone  thyroid medicine  water pills or diuretics This list may not describe all possible interactions. Give your health care provider a list of all the medicines, herbs, non-prescription drugs, or dietary  supplements you use. Also tell them if you smoke, drink alcohol, or use illegal drugs. Some items may interact with your medicine. What should I watch for while using this medicine? Visit your doctor or health care professional for regular checks on your progress. A test called the HbA1C (A1C) will be monitored. This is a simple blood test. It measures your blood sugar control over the last 2 to 3 months. You will receive this test every 3 to 6 months. Learn how to check your blood sugar. Learn the symptoms of low and high blood sugar and how to manage them. Always carry a quick-source of sugar with you in case you have symptoms of low blood sugar. Examples include hard sugar candy or glucose tablets. Make sure others know that you can choke if you eat or drink when you develop serious symptoms of low blood sugar, such as seizures or unconsciousness. They must get medical help at once. Tell your doctor or health care professional if you have high blood sugar. You might need to change the dose of your medicine. If you are sick or exercising more than usual, you might need to change the dose of your medicine. Do not skip meals. Ask your doctor or health care professional if you should avoid alcohol. Many nonprescription cough and cold products contain sugar or alcohol. These can affect blood sugar. This medicine can make you more sensitive to the sun. Keep out of the sun. If you cannot avoid being in the sun, wear protective clothing and use sunscreen. Do not use sun lamps or tanning beds/booths. Wear a medical ID bracelet or chain, and carry a card that describes your disease and details of your medicine and dosage times. What side effects may I notice from receiving this medicine? Side effects that you should report to your doctor or health care professional as soon as possible:  allergic reactions like skin rash, itching or hives, swelling of the face, lips, or tongue  breathing problems  dark  urine  fever, chills, sore throat  signs and symptoms of low blood sugar such as feeling anxious, confusion, dizziness, increased hunger, unusually weak or tired, sweating, shakiness, cold, irritable, headache, blurred vision, fast heartbeat, loss of consciousness  unusual bleeding or bruising  yellowing of the eyes or skin Side effects that usually do not require medical attention (report to your doctor or health care professional if they continue or are bothersome):  diarrhea  dizziness  headache  heartburn  nausea  stomach gas This list may not describe all possible side effects. Call your doctor for medical advice about side effects. You may report side effects to FDA at 1-800-FDA-1088. Where should I keep my medicine? Keep out of the reach of children. Store at room temperature below 30 degrees C (86 degrees F). Throw away any unused medicine after the expiration date. NOTE: This sheet is a summary. It may not cover all possible information. If you have questions about this  medicine, talk to your doctor, pharmacist, or health care provider.  2020 Elsevier/Gold Standard (2012-08-30 14:29:47)    Plantar Fasciitis  Plantar fasciitis is a painful foot condition that affects the heel. It occurs when the band of tissue that connects the toes to the heel bone (plantar fascia) becomes irritated. This can happen as the result of exercising too much or doing other repetitive activities (overuse injury). The pain from plantar fasciitis can range from mild irritation to severe pain that makes it difficult to walk or move. The pain is usually worse in the morning after sleeping, or after sitting or lying down for a while. Pain may also be worse after long periods of walking or standing. What are the causes? This condition may be caused by:  Standing for long periods of time.  Wearing shoes that do not have good arch support.  Doing activities that put stress on joints (high-impact  activities), including running, aerobics, and ballet.  Being overweight.  An abnormal way of walking (gait).  Tight muscles in the back of your lower leg (calf).  High arches in your feet.  Starting a new athletic activity. What are the signs or symptoms? The main symptom of this condition is heel pain. Pain may:  Be worse with first steps after a time of rest, especially in the morning after sleeping or after you have been sitting or lying down for a while.  Be worse after long periods of standing still.  Decrease after 30-45 minutes of activity, such as gentle walking. How is this diagnosed? This condition may be diagnosed based on your medical history and your symptoms. Your health care provider may ask questions about your activity level. Your health care provider will do a physical exam to check for:  A tender area on the bottom of your foot.  A high arch in your foot.  Pain when you move your foot.  Difficulty moving your foot. You may have imaging tests to confirm the diagnosis, such as:  X-rays.  Ultrasound.  MRI. How is this treated? Treatment for plantar fasciitis depends on how severe your condition is. Treatment may include:  Rest, ice, applying pressure (compression), and raising the affected foot (elevation). This may be called RICE therapy. Your health care provider may recommend RICE therapy along with over-the-counter pain medicines to manage your pain.  Exercises to stretch your calves and your plantar fascia.  A splint that holds your foot in a stretched, upward position while you sleep (night splint).  Physical therapy to relieve symptoms and prevent problems in the future.  Injections of steroid medicine (cortisone) to relieve pain and inflammation.  Stimulating your plantar fascia with electrical impulses (extracorporeal shock wave therapy). This is usually the last treatment option before surgery.  Surgery, if other treatments have not worked  after 12 months. Follow these instructions at home:  Managing pain, stiffness, and swelling  If directed, put ice on the painful area: ? Put ice in a plastic bag, or use a frozen bottle of water. ? Place a towel between your skin and the bag or bottle. ? Roll the bottom of your foot over the bag or bottle. ? Do this for 20 minutes, 2-3 times a day.  Wear athletic shoes that have air-sole or gel-sole cushions, or try wearing soft shoe inserts that are designed for plantar fasciitis.  Raise (elevate) your foot above the level of your heart while you are sitting or lying down. Activity  Avoid activities that cause pain.  Ask your health care provider what activities are safe for you.  Do physical therapy exercises and stretches as told by your health care provider.  Try activities and forms of exercise that are easier on your joints (low-impact). Examples include swimming, water aerobics, and biking. General instructions  Take over-the-counter and prescription medicines only as told by your health care provider.  Wear a night splint while sleeping, if told by your health care provider. Loosen the splint if your toes tingle, become numb, or turn cold and blue.  Maintain a healthy weight, or work with your health care provider to lose weight as needed.  Keep all follow-up visits as told by your health care provider. This is important. Contact a health care provider if you:  Have symptoms that do not go away after caring for yourself at home.  Have pain that gets worse.  Have pain that affects your ability to move or do your daily activities. Summary  Plantar fasciitis is a painful foot condition that affects the heel. It occurs when the band of tissue that connects the toes to the heel bone (plantar fascia) becomes irritated.  The main symptom of this condition is heel pain that may be worse after exercising too much or standing still for a long time.  Treatment varies, but it  usually starts with rest, ice, compression, and elevation (RICE therapy) and over-the-counter medicines to manage pain. This information is not intended to replace advice given to you by your health care provider. Make sure you discuss any questions you have with your health care provider. Document Released: 02/09/2001 Document Revised: 04/29/2017 Document Reviewed: 03/14/2017 Elsevier Patient Education  2020 [place].    Plantar Fasciitis Rehab Ask your health care provider which exercises are safe for you. Do exercises exactly as told by your health care provider and adjust them as directed. It is normal to feel mild stretching, pulling, tightness, or discomfort as you do these exercises. Stop right away if you feel sudden pain or your pain gets worse. Do not begin these exercises until told by your health care provider. Stretching and range-of-motion exercises These exercises warm up your muscles and joints and improve the movement and flexibility of your foot. These exercises also help to relieve pain. Plantar fascia stretch  1. Sit with your left / right leg crossed over your opposite knee. 2. Hold your heel with one hand with that thumb near your arch. With your other hand, hold your toes and gently pull them back toward the top of your foot. You should feel a stretch on the bottom of your toes or your foot (plantar fascia) or both. 3. Hold this stretch for__________ seconds. 4. Slowly release your toes and return to the starting position. Repeat __________ times. Complete this exercise __________ times a day. Gastrocnemius stretch, standing This exercise is also called a calf (gastroc) stretch. It stretches the muscles in the back of the upper calf. 1. Stand with your hands against a wall. 2. Extend your left / right leg behind you, and bend your front knee slightly. 3. Keeping your heels on the floor and your back knee straight, shift your weight toward the wall. Do not arch your  back. You should feel a gentle stretch in your upper left / right calf. 4. Hold this position for __________ seconds. Repeat __________ times. Complete this exercise __________ times a day. Soleus stretch, standing This exercise is also called a calf (soleus) stretch. It stretches the muscles in the back  of the lower calf. 1. Stand with your hands against a wall. 2. Extend your left / right leg behind you, and bend your front knee slightly. 3. Keeping your heels on the floor, bend your back knee and shift your weight slightly over your back leg. You should feel a gentle stretch deep in your lower calf. 4. Hold this position for __________ seconds. Repeat __________ times. Complete this exercise __________ times a day. Gastroc and soleus stretch, standing step This exercise stretches the muscles in the back of the lower leg. These muscles are in the upper calf (gastrocnemius) and the lower calf (soleus). 1. Stand with the ball of your left / right foot on a step. The ball of your foot is on the walking surface, right under your toes. 2. Keep your other foot firmly on the same step. 3. Hold on to the wall or a railing for balance. 4. Slowly lift your other foot, allowing your body weight to press your left / right heel down over the edge of the step. You should feel a stretch in your left / right calf. 5. Hold this position for __________ seconds. 6. Return both feet to the step. 7. Repeat this exercise with a slight bend in your left / right knee. Repeat __________ times with your left / right knee straight and __________ times with your left / right knee bent. Complete this exercise __________ times a day. Balance exercise This exercise builds your balance and strength control of your arch to help take pressure off your plantar fascia. Single leg stand If this exercise is too easy, you can try it with your eyes closed or while standing on a pillow. 1. Without shoes, stand near a railing or in  a doorway. You may hold on to the railing or door frame as needed. 2. Stand on your left / right foot. Keep your big toe down on the floor and try to keep your arch lifted. Do not let your foot roll inward. 3. Hold this position for __________ seconds. Repeat __________ times. Complete this exercise __________ times a day. This information is not intended to replace advice given to you by your health care provider. Make sure you discuss any questions you have with your health care provider. Document Released: 05/17/2005 Document Revised: 09/07/2018 Document Reviewed: 03/15/2018 Elsevier Patient Education  2020 [employer].

## 2019-01-11 ENCOUNTER — Other Ambulatory Visit: Payer: Self-pay | Admitting: Adult Health

## 2019-01-11 LAB — CBC WITH DIFFERENTIAL/PLATELET
Absolute Monocytes: 511 cells/uL (ref 200–950)
Basophils Absolute: 99 cells/uL (ref 0–200)
Basophils Relative: 0.7 %
Eosinophils Absolute: 227 cells/uL (ref 15–500)
Eosinophils Relative: 1.6 %
HCT: 37.3 % (ref 35.0–45.0)
Hemoglobin: 11.7 g/dL (ref 11.7–15.5)
Lymphs Abs: 5652 cells/uL — ABNORMAL HIGH (ref 850–3900)
MCH: 19.2 pg — ABNORMAL LOW (ref 27.0–33.0)
MCHC: 31.4 g/dL — ABNORMAL LOW (ref 32.0–36.0)
MCV: 61.1 fL — ABNORMAL LOW (ref 80.0–100.0)
MPV: 9.6 fL (ref 7.5–12.5)
Monocytes Relative: 3.6 %
Neutro Abs: 7711 cells/uL (ref 1500–7800)
Neutrophils Relative %: 54.3 %
Platelets: 460 10*3/uL — ABNORMAL HIGH (ref 140–400)
RBC: 6.1 10*6/uL — ABNORMAL HIGH (ref 3.80–5.10)
RDW: 17.5 % — ABNORMAL HIGH (ref 11.0–15.0)
Total Lymphocyte: 39.8 %
WBC: 14.2 10*3/uL — ABNORMAL HIGH (ref 3.8–10.8)

## 2019-01-11 LAB — LIPID PANEL
Cholesterol: 270 mg/dL — ABNORMAL HIGH (ref <200)
HDL: 33 mg/dL — ABNORMAL LOW (ref >=50)
Non-HDL Cholesterol (Calc): 237 mg/dL (calc) — ABNORMAL HIGH (ref <130)
Total CHOL/HDL Ratio: 8.2 (calc) — ABNORMAL HIGH (ref <5.0)
Triglycerides: 551 mg/dL — ABNORMAL HIGH (ref <150)

## 2019-01-11 LAB — COMPLETE METABOLIC PANEL WITH GFR
AG Ratio: 1.8 (calc) (ref 1.0–2.5)
ALT: 9 U/L (ref 6–29)
AST: 9 U/L — ABNORMAL LOW (ref 10–35)
Albumin: 4.4 g/dL (ref 3.6–5.1)
Alkaline phosphatase (APISO): 79 U/L (ref 37–153)
BUN: 9 mg/dL (ref 7–25)
CO2: 26 mmol/L (ref 20–32)
Calcium: 9.9 mg/dL (ref 8.6–10.4)
Chloride: 102 mmol/L (ref 98–110)
Creat: 0.66 mg/dL (ref 0.50–0.99)
GFR, Est African American: 108 mL/min/{1.73_m2} (ref >=60)
GFR, Est Non African American: 93 mL/min/{1.73_m2} (ref >=60)
Globulin: 2.5 g/dL (calc) (ref 1.9–3.7)
Glucose, Bld: 193 mg/dL — ABNORMAL HIGH (ref 65–99)
Potassium: 4.5 mmol/L (ref 3.5–5.3)
Sodium: 138 mmol/L (ref 135–146)
Total Bilirubin: 0.4 mg/dL (ref 0.2–1.2)
Total Protein: 6.9 g/dL (ref 6.1–8.1)

## 2019-01-11 LAB — HEMOGLOBIN A1C
Hgb A1c MFr Bld: 8.9 % of total Hgb — ABNORMAL HIGH (ref <5.7)
Mean Plasma Glucose: 209 (calc)
eAG (mmol/L): 11.6 (calc)

## 2019-01-11 MED ORDER — FENOFIBRATE 145 MG PO TABS: 160.0000 mg | ORAL_TABLET | Freq: Every day | ORAL | 1 refills | Status: DC

## 2019-03-08 ENCOUNTER — Other Ambulatory Visit: Payer: Self-pay | Admitting: Adult Health

## 2019-03-08 DIAGNOSIS — F3341 Major depressive disorder, recurrent, in partial remission: Secondary | ICD-10-CM

## 2019-04-12 ENCOUNTER — Ambulatory Visit: Payer: Self-pay | Admitting: Adult Health

## 2019-04-16 DIAGNOSIS — Z720 Tobacco use: Secondary | ICD-10-CM | POA: Insufficient documentation

## 2019-04-16 DIAGNOSIS — F17200 Nicotine dependence, unspecified, uncomplicated: Secondary | ICD-10-CM | POA: Insufficient documentation

## 2019-04-16 NOTE — Progress Notes (Signed)
FOLLOW UP  Assessment and Plan:   Hypertension Well controlled without medications Monitor blood pressure at home; patient to call if consistently greater than 130/80 Continue DASH diet.   Reminder to go to the ER if any CP, SOB, nausea, dizziness, severe HA, changes vision/speech, left arm numbness and tingling and jaw pain.  Cholesterol Currently above goal; restart rosuvastatin, continue fenofibrate  LDL goal <70 due to numerous risk factors She understands risk of MI/Stroke with poorly controlled diabetes, cholesterol and smoking Continue low cholesterol diet and exercise.  Check lipid panel.   Diabetes with other diabetic neurologic complication Overall poorly compliant and very poor control Scared of needles and refuses to check glucose Continue medication:  newly taking glimepiride 4 mg daily and tolerating well, discussed resume metformin 500 mg 1-2 tabs daily- check with pharmacy about resuming XR  Continue diet and exercise.  Perform daily foot/skin check, notify office of any concerning changes.  Check A1C  BMI 28 Long discussion about weight loss, diet, and exercise Recommended diet heavy in fruits and veggies and low in animal meats, cheeses, and dairy products, appropriate calorie intake Discussed ideal weight for height  Will follow up in 3 months  Vitamin D Def Taking 5000 IU daily  Check vitamin D at next OV once she has medicare  Tobacco use Discussed risks associated with tobacco use and advised to reduce or quit Patient is not ready to do so, but advised to consider strongly Will follow up at the next visit, get Ct screening once she gets on medicare  Leukocytosis Hass seen [name] recently, recommended continued follow up CBC today She will follow up once she has medicare; currently self pay   Depression in partial remission/ Insomnia Continue celexa 40 mg daily which has been helpful Limited benefit with seroquel though has improved mood and  wants to continue; didn't do well with trazodone or gabapentin She reports does well with 0.25 mg PRN xanax; emphasized need to limit to <5 days/week, she feels this is reasonable, will monitor frequency/PDMP closely, no dose increases Insomnia- good sleep hygiene discussed, increase day time activity   Continue diet and meds as discussed. Further disposition pending results of labs. Discussed med's effects and SE's.   Over 30 minutes of exam, counseling, chart review, and critical decision making was performed.   No future appointments.  ----------------------------------------------------------------------------------------------------------------------  HPI 63 y.o. female, cash pay, presents for 3 month follow up on hypertension, cholesterol, diabetes, weight and vitamin D deficiency. She is self pay but will be signing up for medicare now than she is 63.   She has hx of colon cancer in 2010, s/p resection and established with [name]; she has had unexplained leukocytosis and microcytosis documented since 2010.  Etiology remains unclear with possible myeloproliferative disorder and was referred back to Oncology for evaluation and has been found to be stable.  She is feeling down due to her friend and her daughter passed away suddenly in a car wreck 2 weeks ago. She is down but denies SI/HI.  she has a diagnosis of depression and is currently on celexa 40 mg daily, reports symptoms are well controlled on current regimen. she additionally has insomnia, taking seroquel 25 mg at night but while helps with mood hasn't improved sleep. She tried trazodone 75 mg-150 mg which she reports wasn't effective, gabapentin 300 mg 1-3 caps, didn't help with pain or sleep per patient. She has been on xanax, takes 0.25 mg as needed for sleep.  She continues to smoke, 1+ pack a day, more since her friend had her car accident.   BMI is Body mass index is 28.34 kg/m., she has been working on diet, she is  walking daily, 15 min daily. She is still drinking a some diet soda daily, drinks 2 cups of water daily. She admits eating only 1 meal a day, typically has sandwich with multigrain bread, crackers, etc, and will eat fruit. Very poorly motivated to work on lifestyle. Wt Readings from Last 3 Encounters:  04/17/19 160 lb (72.6 kg)  01/10/19 156 lb 12.8 oz (71.1 kg)  10/04/18 159 lb 12.8 oz (72.5 kg)   Her blood pressure has been controlled at home, today their BP is BP: 110/68  She does not workout. She denies chest pain, shortness of breath, dizziness.   She is on cholesterol medication (rosuvastatin 40 mg but doesn't take frequently as causes GI discomfort - all meds do, hasn't been taking since last visit, has been taking fenofibrate 145 mg daily since last visit) and denies myalgias. Her cholesterol is not at goal. The cholesterol last visit was:   Lab Results  Component Value Date   CHOL 270 (H) 01/10/2019   HDL 33 (L) 01/10/2019   [place]  01/10/2019     Comment:     . LDL cholesterol not calculated. Triglyceride levels greater than 400 mg/dL invalidate calculated LDL results. . Reference range: <100 . Desirable range <100 mg/dL for primary prevention;   <70 mg/dL for patients with CHD or diabetic patients  with > or = 2 CHD risk factors. Marland Kitchen LDL-C is now calculated using the [name]  calculation, which is a validated novel method providing  better accuracy than the Friedewald equation in the  estimation of LDL-C.  [name] et al. [name]. [id number]): 2061-2068  (http://education.[facility].com/faq/FAQ164)    TRIG 551 (H) 01/10/2019   CHOLHDL 8.2 (H) 01/10/2019    She has been working on diet and diabetes, and denies foot ulcerations, increased appetite, nausea, polydipsia, polyuria, vomiting and weight loss. She does not currently take sugars at home, adamantly refuses to do so. She has reported intolerance to multiple oral agents and has been poorly compliant with  therapy, and is poorly compliant with dietary recommendations as well. Consequently A1Cs have been very poorly controlled. She has not been taking metformin since last visit, reports did tolerate 500 mg BID, had diarrhea with 3 tabs, has tolerated glimepiride 4 mg daily and has been taking this daily. Tried samples of onglyza, trajenta, jardiance. Last A1C in the office was:  Lab Results  Component Value Date   HGBA1C 8.9 (H) 01/10/2019    Lab Results  Component Value Date   GFRNONAA 93 01/10/2019   Patient is on Vitamin D supplement, Taking 5000 IU daily when she remembers.  Lab Results  Component Value Date   VD25OH 10 (L) 10/04/2018       Current Medications:  Current Outpatient Medications on File Prior to Visit  Medication Sig  . ALPRAZolam (XANAX) 0.5 MG tablet Take 0.5-1 tablets (0.25-0.5 mg total) by mouth at bedtime as needed for anxiety or sleep.  . CHOLECALCIFEROL PO Take 5,000 Units by mouth daily.  . citalopram (CELEXA) 40 MG tablet Take 1 tablet (40 mg total) by mouth daily.  . fenofibrate (TRICOR) 145 MG tablet Take 1 tablet (145 mg total) by mouth daily.  Marland Kitchen glimepiride (AMARYL) 4 MG tablet Take 0.5-1 tablets (2-4 mg total) by mouth every morning.  . promethazine (PHENERGAN) 25  MG tablet Take 1 tablet (25 mg total) by mouth every 6 (six) hours as needed for nausea or vomiting.  Marland Kitchen QUEtiapine (SEROQUEL) 25 MG tablet TAKE 1 TABLET BY MOUTH AT BEDTIME  . metFORMIN (GLUCOPHAGE) 500 MG tablet Take 4 tabs daily with meals; spread over the course of the day, take 2 tabs with largest meal. (Patient not taking: Reported on 04/17/2019)  . rosuvastatin (CRESTOR) 40 MG tablet Take 1 tablet (40 mg total) by mouth daily. (Patient not taking: Reported on 01/10/2019)   No current facility-administered medications on file prior to visit.      Allergies:  Allergies  Allergen Reactions  . Codeine   . Glipizide Nausea And Vomiting  . Lunesta [Eszopiclone]     Excessive fatigue  .  Welchol [Colesevelam Hcl]     nausea      Medical History:  Past Medical History:  Diagnosis Date  . Anxiety   . Cervical dysplasia   . Colon cancer ([place])   . Depression   . Diabetes ([name])   . Hyperlipidemia   . Right-sided Bell's palsy 01/03/2019  . Vitamin D deficiency    Family history- Reviewed and unchanged Social history- Reviewed and unchanged   Review of Systems:  Review of Systems  Constitutional: Negative for malaise/fatigue and weight loss.  HENT: Negative for hearing loss and tinnitus.   Eyes: Negative for blurred vision and double vision.  Respiratory: Negative for cough, shortness of breath and wheezing.   Cardiovascular: Negative for chest pain, palpitations, orthopnea, claudication and leg swelling.  Gastrointestinal: Negative for abdominal pain, blood in stool, constipation, diarrhea (with metformin), heartburn, melena, nausea and vomiting.  Genitourinary: Negative.   Musculoskeletal: Positive for joint pain (bilateral feet). Negative for myalgias.  Skin: Negative for rash.  Neurological: Negative for dizziness, tingling, sensory change, weakness and headaches.  Endo/Heme/Allergies: Negative for polydipsia.  Psychiatric/Behavioral: Negative for depression, substance abuse and suicidal ideas. The patient has insomnia. The patient is not nervous/anxious.   All other systems reviewed and are negative.   Physical Exam: BP 110/68   Pulse 74   Temp (!) 97.2 F (36.2 C)   Wt 160 lb (72.6 kg)   SpO2 98%   BMI 28.34 kg/m  Wt Readings from Last 3 Encounters:  04/17/19 160 lb (72.6 kg)  01/10/19 156 lb 12.8 oz (71.1 kg)  10/04/18 159 lb 12.8 oz (72.5 kg)   General Appearance: Well nourished, in no apparent distress. Eyes: PERRLA, EOMs, conjunctiva no swelling or erythema Sinuses: No Frontal/maxillary tenderness ENT/Mouth: Ext aud canals clear, TMs without erythema, bulging. No erythema, swelling, or exudate on post pharynx.  Tonsils not swollen or  erythematous. Hearing normal.  Neck: Supple, thyroid normal.  Respiratory: Respiratory effort normal, BS equal bilaterally without rales, rhonchi, wheezing or stridor.  Cardio: RRR with no MRGs. Brisk peripheral pulses without edema.  Abdomen: Soft, + BS.  Non tender, no guarding, rebound, hernias, masses. Lymphatics: Non tender without lymphadenopathy.  Musculoskeletal: Full ROM, 5/5 strength, Normal gait Skin: Warm, dry without rashes, lesions, ecchymosis.  Neuro: Cranial nerves intact. No cerebellar symptoms.  Psych: Awake and oriented X 3, depressed/sad affect, Insight and Judgment appropriate.    Izora Ribas, NP 3:49 PM [facility]

## 2019-04-17 ENCOUNTER — Other Ambulatory Visit: Payer: Self-pay

## 2019-04-17 ENCOUNTER — Ambulatory Visit (INDEPENDENT_AMBULATORY_CARE_PROVIDER_SITE_OTHER): Payer: Self-pay | Admitting: Adult Health

## 2019-04-17 ENCOUNTER — Encounter: Payer: Self-pay | Admitting: Adult Health

## 2019-04-17 VITALS — BP 110/68 | HR 74 | Temp 97.2°F | Wt 160.0 lb

## 2019-04-17 DIAGNOSIS — F329 Major depressive disorder, single episode, unspecified: Secondary | ICD-10-CM

## 2019-04-17 DIAGNOSIS — E1122 Type 2 diabetes mellitus with diabetic chronic kidney disease: Secondary | ICD-10-CM

## 2019-04-17 DIAGNOSIS — Z794 Long term (current) use of insulin: Secondary | ICD-10-CM

## 2019-04-17 DIAGNOSIS — E663 Overweight: Secondary | ICD-10-CM

## 2019-04-17 DIAGNOSIS — N181 Chronic kidney disease, stage 1: Secondary | ICD-10-CM

## 2019-04-17 DIAGNOSIS — F17200 Nicotine dependence, unspecified, uncomplicated: Secondary | ICD-10-CM

## 2019-04-17 DIAGNOSIS — D72829 Elevated white blood cell count, unspecified: Secondary | ICD-10-CM

## 2019-04-17 DIAGNOSIS — F3341 Major depressive disorder, recurrent, in partial remission: Secondary | ICD-10-CM

## 2019-04-17 DIAGNOSIS — E1169 Type 2 diabetes mellitus with other specified complication: Secondary | ICD-10-CM

## 2019-04-17 DIAGNOSIS — F32A Depression, unspecified: Secondary | ICD-10-CM

## 2019-04-17 DIAGNOSIS — Z85038 Personal history of other malignant neoplasm of large intestine: Secondary | ICD-10-CM

## 2019-04-17 DIAGNOSIS — F419 Anxiety disorder, unspecified: Secondary | ICD-10-CM

## 2019-04-17 DIAGNOSIS — E559 Vitamin D deficiency, unspecified: Secondary | ICD-10-CM

## 2019-04-17 DIAGNOSIS — E785 Hyperlipidemia, unspecified: Secondary | ICD-10-CM

## 2019-04-17 NOTE — Patient Instructions (Signed)
Goals    . DIET - EAT MORE FRUITS AND VEGETABLES     Aim for 5-7 servings daily (1/2 cup each)     . DIET - INCREASE WATER INTAKE     Aim to work up to 65+ fluid ounces daily     . Exercise 150 min per week    . Weight (lb) < 150 lb (68 kg)       Please restart metformin 500 mg twice daily if A1C is above 7% today  Please restart taking rosuvastatin at least 3 days a week - continue fenofibrate - they work a bit differently     Managing Loss, Adult People experience loss in many different ways throughout their lives. Events such as moving, changing jobs, and losing friends can create a sense of loss. The loss may be as serious as a major health change, divorce, death of a pet, or death of a loved one. All of these types of loss are likely to create a physical and emotional reaction known as grief. Grief is the result of a major change or an absence of something or someone that you count on. Grief is a normal reaction to loss. A variety of factors can affect your grieving experience, including:  The nature of your loss.  Your relationship to what or whom you lost.  Your understanding of grief and how to manage it.  Your support system. How to manage lifestyle changes Keep to your normal routine as much as possible.  If you have trouble focusing or doing normal activities, it is acceptable to take some time away from your normal routine.  Spend time with friends and loved ones.  Eat a healthy diet, get plenty of sleep, and rest when you feel tired. How to recognize changes  The way that you deal with your grief will affect your ability to function as you normally do. When grieving, you may experience these changes:  Numbness, shock, sadness, anxiety, anger, denial, and guilt.  Thoughts about death.  Unexpected crying.  A physical sensation of emptiness in your stomach.  Problems sleeping and eating.  Tiredness (fatigue).  Loss of interest in normal activities.   Dreaming about or imagining seeing the person who died.  A need to remember what or whom you lost.  Difficulty thinking about anything other than your loss for a period of time.  Relief. If you have been expecting the loss for a while, you may feel a sense of relief when it happens. Follow these instructions at home:  Activity Express your feelings in healthy ways, such as:  Talking with others about your loss. It may be helpful to find others who have had a similar loss, such as a support group.  Writing down your feelings in a journal.  Doing physical activities to release stress and emotional energy.  Doing creative activities like painting, sculpting, or playing or listening to music.  Practicing resilience. This is the ability to recover and adjust after facing challenges. Reading some resources that encourage resilience may help you to learn ways to practice those behaviors. General instructions  Be patient with yourself and others. Allow the grieving process to happen, and remember that grieving takes time. ? It is likely that you may never feel completely done with some grief. You may find a way to move on while still cherishing memories and feelings about your loss. ? Accepting your loss is a process. It can take months or longer to adjust.  Keep  all follow-up visits as told by your health care provider. This is important. Where to find support To get support for managing loss:  Ask your health care provider for help and recommendations, such as grief counseling or therapy.  Think about joining a support group for people who are managing a loss. Where to find more information You can find more information about managing loss from:  American Society of Clinical Oncology: www.cancer.net  American Psychological Association: TVStereos.ch Contact a health care provider if:  Your grief is extreme and keeps getting worse.  You have ongoing grief that does not improve.   Your body shows symptoms of grief, such as illness.  You feel depressed, anxious, or lonely. Get help right away if:  You have thoughts about hurting yourself or others. If you ever feel like you may hurt yourself or others, or have thoughts about taking your own life, get help right away. You can go to your nearest emergency department or call:  Your local emergency services (911 in the [place]  A suicide crisis helpline, such as the [facility] at 279-049-6204. This is open 24 hours a day. Summary  Grief is the result of a major change or an absence of someone or something that you count on. Grief is a normal reaction to loss.  The depth of grief and the period of recovery depend on the type of loss and your ability to adjust to the change and process your feelings.  Processing grief requires patience and a willingness to accept your feelings and talk about your loss with people who are supportive.  It is important to find resources that work for you and to realize that people experience grief differently. There is not one grieving process that works for everyone in the same way.  Be aware that when grief becomes extreme, it can lead to more severe issues like isolation, depression, anxiety, or suicidal thoughts. Talk with your health care provider if you have any of these issues. This information is not intended to replace advice given to you by your health care provider. Make sure you discuss any questions you have with your health care provider. Document Released: 09/30/2016 Document Revised: 07/21/2018 Document Reviewed: 09/30/2016 Elsevier Patient Education  [place].

## 2019-04-18 LAB — COMPLETE METABOLIC PANEL WITH GFR
AG Ratio: 1.7 (calc) (ref 1.0–2.5)
ALT: 10 U/L (ref 6–29)
AST: 11 U/L (ref 10–35)
Albumin: 4.3 g/dL (ref 3.6–5.1)
Alkaline phosphatase (APISO): 71 U/L (ref 37–153)
BUN: 11 mg/dL (ref 7–25)
CO2: 25 mmol/L (ref 20–32)
Calcium: 10 mg/dL (ref 8.6–10.4)
Chloride: 99 mmol/L (ref 98–110)
Creat: 0.74 mg/dL (ref 0.50–0.99)
GFR, Est African American: 99 mL/min/{1.73_m2} (ref >=60)
GFR, Est Non African American: 85 mL/min/{1.73_m2} (ref >=60)
Globulin: 2.6 g/dL (calc) (ref 1.9–3.7)
Glucose, Bld: 209 mg/dL — ABNORMAL HIGH (ref 65–99)
Potassium: 4.6 mmol/L (ref 3.5–5.3)
Sodium: 136 mmol/L (ref 135–146)
Total Bilirubin: 0.4 mg/dL (ref 0.2–1.2)
Total Protein: 6.9 g/dL (ref 6.1–8.1)

## 2019-04-18 LAB — CBC WITH DIFFERENTIAL/PLATELET
Absolute Monocytes: 714 cells/uL (ref 200–950)
Basophils Absolute: 132 cells/uL (ref 0–200)
Basophils Relative: 0.7 %
Eosinophils Absolute: 301 cells/uL (ref 15–500)
Eosinophils Relative: 1.6 %
HCT: 39 % (ref 35.0–45.0)
Hemoglobin: 12.3 g/dL (ref 11.7–15.5)
Lymphs Abs: 4888 cells/uL — ABNORMAL HIGH (ref 850–3900)
MCH: 19.5 pg — ABNORMAL LOW (ref 27.0–33.0)
MCHC: 31.5 g/dL — ABNORMAL LOW (ref 32.0–36.0)
MCV: 61.9 fL — ABNORMAL LOW (ref 80.0–100.0)
MPV: 10.4 fL (ref 7.5–12.5)
Monocytes Relative: 3.8 %
Neutro Abs: 12765 cells/uL — ABNORMAL HIGH (ref 1500–7800)
Neutrophils Relative %: 67.9 %
Platelets: 460 10*3/uL — ABNORMAL HIGH (ref 140–400)
RBC: 6.3 10*6/uL — ABNORMAL HIGH (ref 3.80–5.10)
RDW: 17.6 % — ABNORMAL HIGH (ref 11.0–15.0)
Total Lymphocyte: 26 %
WBC: 18.8 10*3/uL — ABNORMAL HIGH (ref 3.8–10.8)

## 2019-04-18 LAB — LIPID PANEL
Cholesterol: 257 mg/dL — ABNORMAL HIGH (ref <200)
HDL: 31 mg/dL — ABNORMAL LOW (ref >=50)
Non-HDL Cholesterol (Calc): 226 mg/dL (calc) — ABNORMAL HIGH (ref <130)
Total CHOL/HDL Ratio: 8.3 (calc) — ABNORMAL HIGH (ref <5.0)
Triglycerides: 592 mg/dL — ABNORMAL HIGH (ref <150)

## 2019-04-18 LAB — HEMOGLOBIN A1C
Hgb A1c MFr Bld: 8.5 % of total Hgb — ABNORMAL HIGH (ref <5.7)
Mean Plasma Glucose: 197 (calc)
eAG (mmol/L): 10.9 (calc)

## 2019-04-25 ENCOUNTER — Other Ambulatory Visit: Payer: Self-pay | Admitting: Adult Health

## 2019-04-25 DIAGNOSIS — G4700 Insomnia, unspecified: Secondary | ICD-10-CM

## 2019-04-25 DIAGNOSIS — F3341 Major depressive disorder, recurrent, in partial remission: Secondary | ICD-10-CM

## 2019-06-08 ENCOUNTER — Other Ambulatory Visit: Payer: Self-pay | Admitting: Adult Health

## 2019-06-08 DIAGNOSIS — F3341 Major depressive disorder, recurrent, in partial remission: Secondary | ICD-10-CM

## 2019-07-20 ENCOUNTER — Encounter: Payer: Self-pay | Admitting: Adult Health

## 2019-07-20 DIAGNOSIS — E1122 Type 2 diabetes mellitus with diabetic chronic kidney disease: Secondary | ICD-10-CM | POA: Insufficient documentation

## 2019-07-20 NOTE — Progress Notes (Deleted)
MEDICARE ANNUAL WELLNESS VISIT AND FOLLOW UP  Assessment:   Diagnoses and all orders for this visit:  Welcome to Medicare preventive visit  Type 2 diabetes mellitus with stage 1 chronic kidney disease, with long-term current use of insulin ([place])  CKD stage 2 due to type 2 diabetes mellitus ([place])  Hyperlipidemia associated with type 2 diabetes mellitus ([place])  Malignant neoplasm of colon, unspecified part of colon ([place])  Depression, major, recurrent, in partial remission ([place])  Vitamin D deficiency  Smoker  Overweight (BMI 25.0-29.9)  Leukocytosis, unspecified type  Arthralgia of both feet  Anxiety      Over 40 minutes of exam, counseling, chart review and critical decision making was performed Future Appointments  Date Time Provider [place]  07/23/2019  2:00 PM Liane Comber, NP [facility] None     Plan:   During the course of the visit the patient was educated and counseled about appropriate screening and preventive services including:    Pneumococcal vaccine   Prevnar 13  Influenza vaccine  Td vaccine  Screening electrocardiogram  Bone densitometry screening  Colorectal cancer screening  Diabetes screening  Glaucoma screening  Nutrition counseling   Advanced directives: requested   Subjective:  Dorothy Boyd is a 64 y.o. female who presents for Medicare Annual Wellness Visit and 3 month follow up. Previously self pay, newly with medicare ***  3 month with DR. Jerilynn Mages, then CPE with PAP  She has hx of colon cancer in 2010, s/p resection and established with [name] for active surveillance; she has had unexplained leukocytosis and microcytosis documented since 2010.  Etiology remains unclear with possible myeloproliferative disorder and was referred back to Oncology for evaluation and has been found to be stable, hasn't been recommended bone marrow biopsy.   she has a diagnosis of depression and is currently on celexa 40 mg daily,  reports symptoms are well controlled on current regimen. she additionally has insomnia, taking seroquel 25 mg at night but while helps with mood hasn't improved sleep. She tried trazodone 75 mg-150 mg which she reports wasn't effective, gabapentin 300 mg 1-3 caps, didn't help with pain or sleep per patient. She has been on xanax, takes 0.25 mg as needed for sleep.   She continues to smoke, 1+ pack a day, more since her friend had her car accident.   BMI is There is no height or weight on file to calculate BMI., she {HAS HAS BFX:83291} been working on diet and exercise. Wt Readings from Last 3 Encounters:  04/17/19 160 lb (72.6 kg)  01/10/19 156 lb 12.8 oz (71.1 kg)  10/04/18 159 lb 12.8 oz (72.5 kg)     She has had elevated blood pressure for *** years. Her blood pressure {HAS HAS NOT:18834} been controlled at home, today their BP is   She {DOES_DOES BTY:60600} workout. She denies chest pain, shortness of breath, dizziness.   She {ACTION; IS/IS KHT:97741423} on cholesterol medication and denies myalgias. Her cholesterol {ACTION; IS/IS NOT:21021397} at goal. The cholesterol last visit was:   Lab Results  Component Value Date   CHOL 257 (H) 04/17/2019   HDL 31 (L) 04/17/2019   [place]  04/17/2019     Comment:     . LDL cholesterol not calculated. Triglyceride levels greater than 400 mg/dL invalidate calculated LDL results. . Reference range: <100 . Desirable range <100 mg/dL for primary prevention;   <70 mg/dL for patients with CHD or diabetic patients  with > or = 2 CHD risk factors. Marland Kitchen  LDL-C is now calculated using the [name]  calculation, which is a validated novel method providing  better accuracy than the Friedewald equation in the  estimation of LDL-C.  [name] et al. [name]. 1610;960(45): 2061-2068  (http://education.[employer].com/faq/FAQ164)    TRIG 592 (H) 04/17/2019   CHOLHDL 8.3 (H) 04/17/2019   She has had diabetes which predates 2015. She {Has/has  not:18111} been working on diet and exercise for T2DM with CKD, and denies {Symptoms; diabetes w/o none:19199}. Last A1C in the office was:  Lab Results  Component Value Date   HGBA1C 8.5 (H) 04/17/2019   She has CKD II associated with T2DM monitored at this office:  Lab Results  Component Value Date   [facility] 85 04/17/2019   Patient is on Vitamin D supplement.   Lab Results  Component Value Date   VD25OH 10 (L) 10/04/2018      Medication Review: Current Outpatient Medications on File Prior to Visit  Medication Sig Dispense Refill  . ALPRAZolam (XANAX) 0.5 MG tablet TAKE 1/2 TO 1 (ONE-HALF TO ONE) TABLET BY MOUTH AT BEDTIME AS NEEDED FOR ANXIETY OR SLEEP 30 tablet 0  . CHOLECALCIFEROL PO Take 5,000 Units by mouth daily.    . citalopram (CELEXA) 40 MG tablet Take 1 tablet by mouth once daily 90 tablet 0  . fenofibrate (TRICOR) 145 MG tablet Take 1 tablet (145 mg total) by mouth daily. 90 tablet 1  . glimepiride (AMARYL) 4 MG tablet Take 0.5-1 tablets (2-4 mg total) by mouth every morning. 90 tablet 1  . metFORMIN (GLUCOPHAGE) 500 MG tablet Take 4 tabs daily with meals; spread over the course of the day, take 2 tabs with largest meal. (Patient not taking: Reported on 04/17/2019) 360 tablet 1  . promethazine (PHENERGAN) 25 MG tablet Take 1 tablet (25 mg total) by mouth every 6 (six) hours as needed for nausea or vomiting. 30 tablet 3  . QUEtiapine (SEROQUEL) 25 MG tablet Take 1 tablet at Bedtime for Sleep 90 tablet 1  . rosuvastatin (CRESTOR) 40 MG tablet Take 1 tablet (40 mg total) by mouth daily. (Patient not taking: Reported on 01/10/2019) 90 tablet 1   No current facility-administered medications on file prior to visit.    Allergies  Allergen Reactions  . Codeine   . Glipizide Nausea And Vomiting  . Lunesta [Eszopiclone]     Excessive fatigue  . Welchol [Colesevelam Hcl]     nausea     Current Problems (verified) Patient Active Problem List   Diagnosis Date Noted  . CKD  stage 2 due to type 2 diabetes mellitus ([place]) 07/20/2019  . Smoker 04/16/2019  . Depression, major, recurrent, in partial remission ([place]) 01/10/2019  . Arthralgia of both feet 10/04/2018  . Overweight (BMI 25.0-29.9) 09/29/2017  . Hyperlipidemia associated with type 2 diabetes mellitus ([place])   . Diabetes ([place])   . Colon cancer ([place])   . Vitamin D deficiency   . Anxiety   . Leukocytosis 09/18/2012    Screening Tests  There is no immunization history on file for this patient.  Preventative care: Last colonoscopy: *** Last mammogram: *** Last pap smear/pelvic exam: ***   DEXA:***  Prior vaccinations: TD or Tdap: ***  Influenza: ***  Pneumococcal: *** Prevnar13:  Shingles/Zostavax: **  Names of Other Physician/Practitioners you currently use: 1. [facility] here for primary care 2. ***, eye doctor, last visit *** 3. ***, dentist, last visit ***  Patient Care Team: Unk Pinto, MD as PCP -  General (Internal Medicine) Alen Blew Mathis Dad, MD as Consulting Physician (Oncology)  SURGICAL HISTORY She  has a past surgical history that includes Appendectomy. FAMILY HISTORY Her family history includes Cancer in her mother; Diabetes in her sister; Heart disease in her brother; Hyperlipidemia in her mother and sister; Hypertension in her mother. SOCIAL HISTORY She  reports that she has been smoking cigarettes. She has been smoking about 0.75 packs per day. She has never used smokeless tobacco. She reports that she does not drink alcohol or use drugs.   MEDICARE WELLNESS OBJECTIVES: Physical activity:   Cardiac risk factors:   Depression/mood screen:   Depression screen [facility] 2/9 01/10/2019  Decreased Interest 0  Down, Depressed, Hopeless 0  PHQ - 2 Score 0  Altered sleeping 3  Tired, decreased energy 3  Change in appetite 2  Feeling bad or failure about yourself  0  Trouble concentrating 0  Moving slowly or fidgety/restless 0   Suicidal thoughts 0  PHQ-9 Score 8    ADLs:  No flowsheet data found.   Cognitive Testing  Alert? Yes  Normal Appearance?Yes  Oriented to person? Yes  Place? Yes   Time? Yes  Recall of three objects?  Yes  Can perform simple calculations? Yes  Displays appropriate judgment?Yes  Can read the correct time from a watch face?Yes  EOL planning:    Review of Systems  Constitutional: Negative for malaise/fatigue and weight loss.  HENT: Negative for hearing loss and tinnitus.   Eyes: Negative for blurred vision and double vision.  Respiratory: Negative for cough, sputum production, shortness of breath and wheezing.   Cardiovascular: Negative for chest pain, palpitations, orthopnea, claudication, leg swelling and PND.  Gastrointestinal: Negative for abdominal pain, blood in stool, constipation, diarrhea, heartburn, melena, nausea and vomiting.  Genitourinary: Negative.   Musculoskeletal: Negative for falls, joint pain and myalgias.  Skin: Negative for rash.  Neurological: Negative for dizziness, tingling, sensory change, weakness and headaches.  Endo/Heme/Allergies: Negative for polydipsia.  Psychiatric/Behavioral: Negative.  Negative for depression, memory loss, substance abuse and suicidal ideas. The patient is not nervous/anxious and does not have insomnia.   All other systems reviewed and are negative.    Objective:     There were no vitals filed for this visit. There is no height or weight on file to calculate BMI.  General appearance: alert, no distress, WD/WN, female HEENT: normocephalic, sclerae anicteric, TMs pearly, nares patent, no discharge or erythema, pharynx normal Oral cavity: MMM, no lesions Neck: supple, no lymphadenopathy, no thyromegaly, no masses Heart: RRR, normal S1, S2, no murmurs Lungs: CTA bilaterally, no wheezes, rhonchi, or rales Abdomen: +bs, soft, non tender, non distended, no masses, no hepatomegaly, no splenomegaly Musculoskeletal: nontender, no  swelling, no obvious deformity Extremities: no edema, no cyanosis, no clubbing Pulses: 2+ symmetric, upper and lower extremities, normal cap refill Neurological: alert, oriented x 3, CN2-12 intact, strength normal upper extremities and lower extremities, sensation normal throughout, DTRs 2+ throughout, no cerebellar signs, gait normal Psychiatric: normal affect, behavior normal, pleasant   Medicare Attestation I have personally reviewed: The patient's medical and social history Their use of alcohol, tobacco or illicit drugs Their current medications and supplements The patient's functional ability including ADLs,fall risks, home safety risks, cognitive, and hearing and visual impairment Diet and physical activities Evidence for depression or mood disorders  The patient's weight, height, BMI, and visual acuity have been recorded in the chart.  I have made referrals, counseling, and provided education to the patient based on  review of the above and I have provided the patient with a written personalized care plan for preventive services.     Izora Ribas, NP   07/20/2019

## 2019-07-23 ENCOUNTER — Ambulatory Visit: Payer: Self-pay | Admitting: Adult Health

## 2019-07-28 ENCOUNTER — Other Ambulatory Visit: Payer: Self-pay | Admitting: Adult Health

## 2019-07-28 DIAGNOSIS — F3341 Major depressive disorder, recurrent, in partial remission: Secondary | ICD-10-CM

## 2019-07-31 NOTE — Progress Notes (Signed)
FOLLOW UP  Assessment and Plan:   Atherosclerosis of aorta Per CT 2019 Control blood pressure, cholesterol, glucose, increase exercise.   Hypertension Well controlled without medications Monitor blood pressure at home; patient to call if consistently greater than 130/80 Continue DASH diet.   Reminder to go to the ER if any CP, SOB, nausea, dizziness, severe HA, changes vision/speech, left arm numbness and tingling and jaw pain.  Cholesterol Currently above goal; restart rosuvastatin, continue fenofibrate, nexlizet samples given  LDL goal <70 due to numerous risk factors She understands risk of MI/Stroke with poorly controlled diabetes, cholesterol and smoking Continue low cholesterol diet and exercise.  Check lipid panel.   Diabetes with other diabetic neurologic complication Overall poorly compliant and very poor control Scared of needles and refuses to check glucose Continue medication: taking glimepiride 4 mg daily and tolerating well, discussed resume metformin 500 mg 1-2 tabs daily- check with pharmacy about resuming XR  Does well with farxiga; given samples and will attempt to reorder, coupon given as now with commercial insurance Continue diet and exercise.  Perform daily foot/skin check, notify office of any concerning changes.  Check A1C  BMI 28 Long discussion about weight loss, diet, and exercise Recommended diet heavy in fruits and veggies and low in animal meats, cheeses, and dairy products, appropriate calorie intake Discussed ideal weight for height  Will follow up in 3 months  Vitamin D Def Taking 5000 IU daily  Check vitamin D at next OV once she has medicare  Tobacco use Discussed risks associated with tobacco use and advised to reduce or quit Patient is not ready to do so, but advised to consider strongly Will follow up at the next visit, get Ct screening once she gets on medicare  Leukocytosis Hass seen [name] recently, recommended continued  follow up CBC today She will follow up once she has medicare  Depression in partial remission/ Insomnia Continue celexa 40 mg daily which has been helpful Limited benefit with seroquel though has improved mood and wants to continue; didn't do well with trazodone or gabapentin She reports does well with 0.25 mg PRN xanax; emphasized need to limit to <5 days/week, she feels this is reasonable, will monitor frequency/PDMP closely, no dose increases Insomnia- good sleep hygiene discussed, increase day time activity  Abdominal pain/constipation Generalied lower/R sided abdominal tenderness with constipation Had CT abdomen without evidence of diverticuloris UTI was treated without change in sx; yeast was mentioned but not treated - diflucan sent in  Syspect possible element of MSK due to paraspinal tenderness Abdominal pain I suspect is r/t constipation; discussed bowel regimen; try mag citrate - 1/2 bottle followed by 2 glasses of water, repeat in 4 hours if needed;  For maintenance increase water, esp with miralax, add sennakot, suspect element of gastroparesis r/t diabetes and colon hx Follow up if sx aren't improving  Continue diet and meds as discussed. Further disposition pending results of labs. Discussed med's effects and SE's.   Over 30 minutes of exam, counseling, chart review, and critical decision making was performed.   No future appointments.  ----------------------------------------------------------------------------------------------------------------------  HPI 64 y.o. female, cash pay, presents for 3 month follow up on hypertension, cholesterol, diabetes, weight and vitamin D deficiency. She is self pay but will be signing up for medicare now than she is 63.   She has hx of colon cancer in 2010, s/p resection and established with [name]; she has had unexplained leukocytosis and microcytosis documented since 2010.  Etiology remains unclear  with possible myeloproliferative  disorder and was referred back to Oncology for evaluation and has been found to be stable.  she has a diagnosis of depression and is currently on celexa 40 mg daily, reports symptoms are well controlled on current regimen. she additionally has insomnia, taking seroquel 25 mg at night but while helps with mood hasn't improved sleep. She tried trazodone 75 mg-150 mg which she reports wasn't effective, gabapentin 300 mg 1-3 caps, didn't help with pain or sleep per patient. She has been on xanax, takes 0.25 mg as needed for sleep.   She continues to smoke, has cut back to <1 pack a day.  She has R flank and lower abdominal pain ongoing for 2 weeks, was seen in ED, dx with UTI and given keflex, on day 7 without resolution. Reports pain is somewhat improved. Worst with flexing to get up out of bed. Tylenol, ibuprofen, heat, ice haven't been helpful. She does report very constipated, hasn't had BM in 3 days but this is pretty normal for her. Had negative CT abd without contrast. Overall improved.   BMI is Body mass index is 28.8 kg/m., she has been working on diet, she is walking daily, 15 min daily. She is still drinking a some diet soda daily, drinks 2 cups of water daily. She admits eating only 1 meal a day, typically has sandwich with multigrain bread, crackers, etc, and will eat fruit.  Very poorly motivated to work on lifestyle. Wt Readings from Last 3 Encounters:  08/01/19 162 lb 9.6 oz (73.8 kg)  04/17/19 160 lb (72.6 kg)  01/10/19 156 lb 12.8 oz (71.1 kg)   She has aortic atherosclerosis per CT 2019.  Her blood pressure has been controlled at home, today their BP is BP: 122/62  She does not workout. She denies chest pain, shortness of breath, dizziness.   She is on cholesterol medication (rosuvastatin 40 mg but doesn't take frequently as causes GI discomfort - all meds do, only taking once a week or so, has been taking fenofibrate 145 mg daily as does fairly with this) and denies myalgias. Her  cholesterol is not at goal. The cholesterol last visit was:   Lab Results  Component Value Date   CHOL 257 (H) 04/17/2019   HDL 31 (L) 04/17/2019   [place]  04/17/2019     Comment:     . LDL cholesterol not calculated. Triglyceride levels greater than 400 mg/dL invalidate calculated LDL results. . Reference range: <100 . Desirable range <100 mg/dL for primary prevention;   <70 mg/dL for patients with CHD or diabetic patients  with > or = 2 CHD risk factors. Marland Kitchen LDL-C is now calculated using the [name]  calculation, which is a validated novel method providing  better accuracy than the Friedewald equation in the  estimation of LDL-C.  [name] et al. [name]. [id number]): 2061-2068  (http://education.[facility].com/faq/FAQ164)    TRIG 592 (H) 04/17/2019   CHOLHDL 8.3 (H) 04/17/2019    She has been working on diet and diabetes, and denies foot ulcerations, increased appetite, nausea, polydipsia, polyuria, vomiting and weight loss. She does not currently take sugars at home, adamantly refuses to do so. She has reported intolerance to multiple oral agents and has been poorly compliant with therapy, and is poorly compliant with dietary recommendations as well. Consequently A1Cs have been very poorly controlled. She has not been taking metformin since last visit, reports did tolerate 500 mg BID, had diarrhea with 3 tabs, has tolerated glimepiride 4 mg  daily and has been taking this daily. Tried samples of onglyza, trajenta, jardiance, farxiga (liked farxiga but $400 with insurance). Last A1C in the office was:  Lab Results  Component Value Date   HGBA1C 8.5 (H) 04/17/2019    Lab Results  Component Value Date   GFRNONAA 85 04/17/2019   Patient is on Vitamin D supplement, Taking 5000 IU daily when she remembers.  Lab Results  Component Value Date   VD25OH 10 (L) 10/04/2018       Current Medications:  Current Outpatient Medications on File Prior to Visit  Medication Sig   . ALPRAZolam (XANAX) 0.5 MG tablet TAKE 1/2 TO 1 (ONE-HALF TO ONE) TABLET BY MOUTH AT BEDTIME AS NEEDED FOR ANXIETY OR SLEEP  . CHOLECALCIFEROL PO Take 5,000 Units by mouth daily.  . citalopram (CELEXA) 40 MG tablet Take 1 tablet by mouth once daily  . fenofibrate (TRICOR) 145 MG tablet Take 1 tablet (145 mg total) by mouth daily.  Marland Kitchen glimepiride (AMARYL) 4 MG tablet Take 0.5-1 tablets (2-4 mg total) by mouth every morning.  . promethazine (PHENERGAN) 25 MG tablet Take 1 tablet (25 mg total) by mouth every 6 (six) hours as needed for nausea or vomiting.  Marland Kitchen QUEtiapine (SEROQUEL) 25 MG tablet Take 1 tablet at Bedtime for Sleep  . metFORMIN (GLUCOPHAGE) 500 MG tablet Take 4 tabs daily with meals; spread over the course of the day, take 2 tabs with largest meal. (Patient not taking: Reported on 04/17/2019)  . rosuvastatin (CRESTOR) 40 MG tablet Take 1 tablet (40 mg total) by mouth daily. (Patient not taking: Reported on 01/10/2019)   No current facility-administered medications on file prior to visit.     Allergies:  Allergies  Allergen Reactions  . Codeine   . Glipizide Nausea And Vomiting  . Lunesta [Eszopiclone]     Excessive fatigue  . Welchol [Colesevelam Hcl]     nausea      Medical History:  Past Medical History:  Diagnosis Date  . Anxiety   . Cervical dysplasia   . Colon cancer ([place])   . Depression   . Diabetes ([place])   . History of colon cancer, stage II 09/18/2012  . Hyperlipidemia   . Right-sided Bell's palsy 01/03/2019  . Vitamin D deficiency    Family history- Reviewed and unchanged Social history- Reviewed and unchanged   Review of Systems:  Review of Systems  Constitutional: Negative for malaise/fatigue and weight loss.  HENT: Negative for hearing loss and tinnitus.   Eyes: Negative for blurred vision and double vision.  Respiratory: Negative for cough, shortness of breath and wheezing.   Cardiovascular: Negative for chest pain, palpitations, orthopnea,  claudication and leg swelling.  Gastrointestinal: Positive for abdominal pain and constipation. Negative for blood in stool, diarrhea, heartburn, melena, nausea and vomiting.  Genitourinary: Negative.   Musculoskeletal: Negative for joint pain (feet improved with tumeric) and myalgias.  Skin: Negative for rash.  Neurological: Negative for dizziness, tingling, sensory change, weakness and headaches.  Endo/Heme/Allergies: Negative for polydipsia.  Psychiatric/Behavioral: Negative for depression, substance abuse and suicidal ideas. The patient has insomnia. The patient is not nervous/anxious.   All other systems reviewed and are negative.   Physical Exam: BP 122/62   Pulse 95   Temp (!) 97.3 F (36.3 C)   Wt 162 lb 9.6 oz (73.8 kg)   SpO2 96%   BMI 28.80 kg/m  Wt Readings from Last 3 Encounters:  08/01/19 162 lb 9.6 oz (73.8 kg)  04/17/19 160  lb (72.6 kg)  01/10/19 156 lb 12.8 oz (71.1 kg)   General Appearance: Well nourished, in no apparent distress. Eyes: PERRLA, EOMs, conjunctiva no swelling or erythema Sinuses: No Frontal/maxillary tenderness ENT/Mouth: Ext aud canals clear, TMs without erythema, bulging. No erythema, swelling, or exudate on post pharynx.  Tonsils not swollen or erythematous. Hearing normal.  Neck: Supple, thyroid normal.  Respiratory: Respiratory effort normal, BS equal bilaterally without rales, rhonchi, wheezing or stridor.  Cardio: RRR with no MRGs. Brisk peripheral pulses without edema.  Abdomen: Soft, + BS.  Generalized superficial tenderness of R and lower abdomen, non-localized/specifit, no guarding, rebound, hernias, palpable masses. No CVA tenderness.  Lymphatics: Non tender without lymphadenopathy.  Musculoskeletal: Full ROM, 5/5 strength, Normal gait. No spinous tenderness, has paraspinal tenderness without spasm.  Skin: Warm, dry without rashes, lesions, ecchymosis.  Neuro: Cranial nerves intact. No cerebellar symptoms.  Psych: Awake and oriented X  3, depressed/sad affect, Insight and Judgment appropriate.    Izora Ribas, NP 2:59 PM [facility]

## 2019-08-01 ENCOUNTER — Other Ambulatory Visit: Payer: Self-pay

## 2019-08-01 ENCOUNTER — Encounter: Payer: Self-pay | Admitting: Adult Health

## 2019-08-01 ENCOUNTER — Ambulatory Visit (INDEPENDENT_AMBULATORY_CARE_PROVIDER_SITE_OTHER): Payer: Medicare Other | Admitting: Adult Health

## 2019-08-01 VITALS — BP 122/62 | HR 95 | Temp 97.3°F | Wt 162.6 lb

## 2019-08-01 DIAGNOSIS — C189 Malignant neoplasm of colon, unspecified: Secondary | ICD-10-CM

## 2019-08-01 DIAGNOSIS — E785 Hyperlipidemia, unspecified: Secondary | ICD-10-CM

## 2019-08-01 DIAGNOSIS — F419 Anxiety disorder, unspecified: Secondary | ICD-10-CM

## 2019-08-01 DIAGNOSIS — E663 Overweight: Secondary | ICD-10-CM

## 2019-08-01 DIAGNOSIS — E1122 Type 2 diabetes mellitus with diabetic chronic kidney disease: Secondary | ICD-10-CM | POA: Diagnosis not present

## 2019-08-01 DIAGNOSIS — I700 Atherosclerosis of aorta: Secondary | ICD-10-CM | POA: Insufficient documentation

## 2019-08-01 DIAGNOSIS — N182 Chronic kidney disease, stage 2 (mild): Secondary | ICD-10-CM

## 2019-08-01 DIAGNOSIS — E1169 Type 2 diabetes mellitus with other specified complication: Secondary | ICD-10-CM | POA: Diagnosis not present

## 2019-08-01 DIAGNOSIS — D72829 Elevated white blood cell count, unspecified: Secondary | ICD-10-CM

## 2019-08-01 DIAGNOSIS — I10 Essential (primary) hypertension: Secondary | ICD-10-CM

## 2019-08-01 DIAGNOSIS — F17200 Nicotine dependence, unspecified, uncomplicated: Secondary | ICD-10-CM

## 2019-08-01 DIAGNOSIS — E559 Vitamin D deficiency, unspecified: Secondary | ICD-10-CM

## 2019-08-01 DIAGNOSIS — N181 Chronic kidney disease, stage 1: Secondary | ICD-10-CM

## 2019-08-01 DIAGNOSIS — F3341 Major depressive disorder, recurrent, in partial remission: Secondary | ICD-10-CM

## 2019-08-01 DIAGNOSIS — Z794 Long term (current) use of insulin: Secondary | ICD-10-CM

## 2019-08-01 MED ORDER — FLUCONAZOLE 150 MG PO TABS: 150.0000 mg | ORAL_TABLET | Freq: Once | ORAL | 3 refills | Status: AC

## 2019-08-01 MED ORDER — GLIMEPIRIDE 4 MG PO TABS: 2.0000 mg | ORAL_TABLET | ORAL | 1 refills | Status: DC

## 2019-08-01 MED ORDER — FARXIGA 10 MG PO TABS: 10.0000 mg | ORAL_TABLET | Freq: Every day | ORAL | 1 refills | Status: DC

## 2019-08-01 MED ORDER — NEXLIZET 180-10 MG PO TABS: 1.0000 | ORAL_TABLET | Freq: Every day | ORAL | 2 refills | Status: DC

## 2019-08-01 NOTE — Patient Instructions (Addendum)
Goals    . DIET - EAT MORE FRUITS AND VEGETABLES     Aim for 5-7 servings daily (1/2 cup each)     . DIET - INCREASE WATER INTAKE     Aim to work up to 65+ fluid ounces daily     . Exercise 150 min per week    . Weight (lb) < 150 lb (68 kg)       Restart metformin 500 mg twice a day   Please start taking vitamin D DAILY   Farixiga, invokana or jardiance 1 tab daily   Try nexlizet 1 tab daily (for cholesterol) - message me to let me know how you do with this   Stop the antibiotic Take 1 tab of diflucan   Then work on getting bowels moving  Increase water intake - goal is 65-80+ fluid ounces per day   If still not moving with miralax and increased fluids - add sennakot to help move bowels    If you haven't had a BM in over 4-5 days then do:  Magnesium citrate 1/2 bottle followed by 2 tall glasses of water Repeat in 4 hours if needed Stay home - will cause diarrhea     HOW TO SCHEDULE A MAMMOGRAM  [facility]  7 a.m.-6:30 p.m., Monday 7 a.m.-5 p.m., Tuesday-Friday Schedule an appointment by calling [phone].  [facility] Schedule an appointment by calling [phone].    Please check with insurance and schedule a diabetes eye exam with an approved ophthalmologist  Eye doctors that you can call, they are all very close to our office   Dr. Delman Cheadle [phone] Dr. Bing Plume [phone] Dr. Herbert Deaner [phone] [place]       Constipation, Adult Constipation is when a person has fewer bowel movements in a week than normal, has difficulty having a bowel movement, or has stools that are dry, hard, or larger than normal. Constipation may be caused by an underlying condition. It may become worse with age if a person takes certain medicines and does not take in enough fluids. Follow these instructions at home: Eating and drinking   Eat foods that have a lot of fiber, such as fresh fruits and vegetables, whole grains, and  beans.  Limit foods that are high in fat, low in fiber, or overly processed, such as french fries, hamburgers, cookies, candies, and soda.  Drink enough fluid to keep your urine clear or pale yellow. General instructions  Exercise regularly or as told by your health care provider.  Go to the restroom when you have the urge to go. Do not hold it in.  Take over-the-counter and prescription medicines only as told by your health care provider. These include any fiber supplements.  Practice pelvic floor retraining exercises, such as deep breathing while relaxing the lower abdomen and pelvic floor relaxation during bowel movements.  Watch your condition for any changes.  Keep all follow-up visits as told by your health care provider. This is important. Contact a health care provider if:  You have pain that gets worse.  You have a fever.  You do not have a bowel movement after 4 days.  You vomit.  You are not hungry.  You lose weight.  You are bleeding from the anus.  You have thin, pencil-like stools. Get help right away if:  You have a fever and your symptoms suddenly get worse.  You leak stool or have blood in your stool.  Your abdomen is bloated.  You have severe pain in your abdomen.  You feel dizzy or you faint. This information is not intended to replace advice given to you by your health care provider. Make sure you discuss any questions you have with your health care provider. Document Revised: 04/29/2017 Document Reviewed: 11/05/2015 Elsevier Patient Education  2020 [employer].

## 2019-08-02 LAB — COMPLETE METABOLIC PANEL WITH GFR
AG Ratio: 1.7 (calc) (ref 1.0–2.5)
ALT: 12 U/L (ref 6–29)
AST: 12 U/L (ref 10–35)
Albumin: 4.4 g/dL (ref 3.6–5.1)
Alkaline phosphatase (APISO): 86 U/L (ref 37–153)
BUN: 10 mg/dL (ref 7–25)
CO2: 27 mmol/L (ref 20–32)
Calcium: 9.9 mg/dL (ref 8.6–10.4)
Chloride: 102 mmol/L (ref 98–110)
Creat: 0.69 mg/dL (ref 0.50–0.99)
GFR, Est African American: 106 mL/min/{1.73_m2} (ref >=60)
GFR, Est Non African American: 91 mL/min/{1.73_m2} (ref >=60)
Globulin: 2.6 g/dL (calc) (ref 1.9–3.7)
Glucose, Bld: 191 mg/dL — ABNORMAL HIGH (ref 65–99)
Potassium: 4.5 mmol/L (ref 3.5–5.3)
Sodium: 137 mmol/L (ref 135–146)
Total Bilirubin: 0.4 mg/dL (ref 0.2–1.2)
Total Protein: 7 g/dL (ref 6.1–8.1)

## 2019-08-02 LAB — CBC WITH DIFFERENTIAL/PLATELET
Absolute Monocytes: 656 cells/uL (ref 200–950)
Basophils Absolute: 148 cells/uL (ref 0–200)
Basophils Relative: 0.9 %
Eosinophils Absolute: 377 cells/uL (ref 15–500)
Eosinophils Relative: 2.3 %
HCT: 40 % (ref 35.0–45.0)
Hemoglobin: 12.7 g/dL (ref 11.7–15.5)
Lymphs Abs: 5068 cells/uL — ABNORMAL HIGH (ref 850–3900)
MCH: 19.6 pg — ABNORMAL LOW (ref 27.0–33.0)
MCHC: 31.8 g/dL — ABNORMAL LOW (ref 32.0–36.0)
MCV: 61.8 fL — ABNORMAL LOW (ref 80.0–100.0)
MPV: 9.8 fL (ref 7.5–12.5)
Monocytes Relative: 4 %
Neutro Abs: 10152 cells/uL — ABNORMAL HIGH (ref 1500–7800)
Neutrophils Relative %: 61.9 %
Platelets: 441 10*3/uL — ABNORMAL HIGH (ref 140–400)
RBC: 6.47 10*6/uL — ABNORMAL HIGH (ref 3.80–5.10)
RDW: 17 % — ABNORMAL HIGH (ref 11.0–15.0)
Total Lymphocyte: 30.9 %
WBC: 16.4 10*3/uL — ABNORMAL HIGH (ref 3.8–10.8)

## 2019-08-02 LAB — TSH: TSH: 3.46 mIU/L (ref 0.40–4.50)

## 2019-08-02 LAB — LIPID PANEL
Cholesterol: 273 mg/dL — ABNORMAL HIGH (ref <200)
HDL: 29 mg/dL — ABNORMAL LOW (ref >=50)
Non-HDL Cholesterol (Calc): 244 mg/dL (calc) — ABNORMAL HIGH (ref <130)
Total CHOL/HDL Ratio: 9.4 (calc) — ABNORMAL HIGH (ref <5.0)
Triglycerides: 437 mg/dL — ABNORMAL HIGH (ref <150)

## 2019-08-02 LAB — HEMOGLOBIN A1C
Hgb A1c MFr Bld: 8.5 % of total Hgb — ABNORMAL HIGH (ref <5.7)
Mean Plasma Glucose: 197 (calc)
eAG (mmol/L): 10.9 (calc)

## 2019-08-02 LAB — MAGNESIUM: Magnesium: 2.1 mg/dL (ref 1.5–2.5)

## 2019-08-15 ENCOUNTER — Telehealth: Payer: Self-pay

## 2019-08-15 ENCOUNTER — Other Ambulatory Visit: Payer: Self-pay | Admitting: Adult Health

## 2019-08-15 DIAGNOSIS — E1169 Type 2 diabetes mellitus with other specified complication: Secondary | ICD-10-CM

## 2019-08-15 DIAGNOSIS — E785 Hyperlipidemia, unspecified: Secondary | ICD-10-CM

## 2019-08-15 DIAGNOSIS — I700 Atherosclerosis of aorta: Secondary | ICD-10-CM

## 2019-08-15 MED ORDER — NEXLIZET 180-10 MG PO TABS: 1.0000 | ORAL_TABLET | Freq: Every day | ORAL | 1 refills | Status: DC

## 2019-08-15 NOTE — Telephone Encounter (Signed)
Patient was calling back about the Nexlizet samples that were given to her at her last office visit. Dorothy Boyd states that she not having any side effects at all. Please send new prescription into the [facility] in [place].

## 2019-08-15 NOTE — Telephone Encounter (Signed)
Left message on voicemail letting her know about prescription being sent in.

## 2019-08-24 ENCOUNTER — Other Ambulatory Visit: Payer: Self-pay | Admitting: Adult Health

## 2019-08-24 DIAGNOSIS — G4700 Insomnia, unspecified: Secondary | ICD-10-CM

## 2019-09-21 LAB — HM DIABETES EYE EXAM

## 2019-10-26 ENCOUNTER — Other Ambulatory Visit: Payer: Self-pay | Admitting: Adult Health

## 2019-10-26 DIAGNOSIS — F3341 Major depressive disorder, recurrent, in partial remission: Secondary | ICD-10-CM

## 2019-11-08 ENCOUNTER — Ambulatory Visit: Payer: Medicare Other | Admitting: Adult Health

## 2019-11-20 NOTE — Progress Notes (Deleted)
MEDICARE ANNUAL WELLNESS VISIT AND FOLLOW UP  Assessment:    Diagnoses and all orders for this visit:  Encounter for Medicare annual wellness exam  Aortic atherosclerosis ([place])  Malignant neoplasm of colon, unspecified part of colon ([place])  Hyperlipidemia associated with type 2 diabetes mellitus ([place])  Type 2 diabetes mellitus with stage 1 chronic kidney disease, with long-term current use of insulin ([place])  CKD stage 2 due to type 2 diabetes mellitus ([place])  Depression, major, recurrent, in partial remission ([place])  Vitamin D deficiency  Smoker  Overweight (BMI 25.0-29.9)  Leukocytosis, unspecified type  Arthralgia of both feet  Anxiety     Over 40 minutes of exam, counseling, chart review and critical decision making was performed Future Appointments  Date Time Provider [place]  11/21/2019  2:00 PM Liane Comber, NP [facility] None     Plan:   During the course of the visit the patient was educated and counseled about appropriate screening and preventive services including:    Pneumococcal vaccine   Prevnar 13  Influenza vaccine  Td vaccine  Screening electrocardiogram  Bone densitometry screening  Colorectal cancer screening  Diabetes screening  Glaucoma screening  Nutrition counseling   Advanced directives: requested   Subjective:  Dorothy Boyd is a 64 y.o. female who presents for Medicare Annual Wellness Visit and 3 month follow up.   She has hx of colon cancer in 2010, s/p resection and established with [name]; she has had unexplained leukocytosis and microcytosis documented since 2010.  Etiology remains unclear with possible myeloproliferative disorder and was referred back to Oncology for evaluation and has been found to be stable.  she has a diagnosis of depression and is currently on celexa 40 mg daily, reports symptoms are well controlled on current regimen. she additionally has insomnia, taking seroquel 25 mg at  night but while helps with mood hasn't improved sleep. She tried trazodone 75 mg-150 mg which she reports wasn't effective, gabapentin 300 mg 1-3 caps, didn't help with pain or sleep per patient. She has been on xanax, takes 0.25 mg as needed for sleep.   She continues to smoke, has cut back to <1 pack a day. *** Ct  BMI is There is no height or weight on file to calculate BMI., she {HAS HAS WEX:93716} been working on diet and exercise. she has been working on diet, she is walking daily, 15 min daily. She is still drinking a some diet soda daily, drinks 2 cups of water daily. She admits eating only 1 meal a day, typically has sandwich with multigrain bread, crackers, etc, and will eat fruit.  Very poorly motivated to work on lifestyle. Wt Readings from Last 3 Encounters:  08/01/19 162 lb 9.6 oz (73.8 kg)  04/17/19 160 lb (72.6 kg)  01/10/19 156 lb 12.8 oz (71.1 kg)   Has aortic atherosclerosis per CT 11/2017 She has had elevated blood pressure for *** years. Her blood pressure {HAS HAS NOT:18834} been controlled at home, today their BP is   She {DOES_DOES RCV:89381} workout. She denies chest pain, shortness of breath, dizziness.   She {ACTION; IS/IS OFB:51025852} on cholesterol medication and denies myalgias. Her cholesterol {ACTION; IS/IS NOT:21021397} at goal. The cholesterol last visit was:   Lab Results  Component Value Date   CHOL 273 (H) 08/01/2019   HDL 29 (L) 08/01/2019   [place]  08/01/2019     Comment:     . LDL cholesterol not calculated. Triglyceride levels greater than 400 mg/dL invalidate  calculated LDL results. . Reference range: <100 . Desirable range <100 mg/dL for primary prevention;   <70 mg/dL for patients with CHD or diabetic patients  with > or = 2 CHD risk factors. Marland Kitchen LDL-C is now calculated using the [name]  calculation, which is a validated novel method providing  better accuracy than the Friedewald equation in the  estimation of LDL-C.  [name]  et al. [name]. 5397;673(41): 2061-2068  (http://education.[employer].com/faq/FAQ164)    TRIG 437 (H) 08/01/2019   CHOLHDL 9.4 (H) 08/01/2019   She has had diabetes for *** years.  She {Has/has not:18111} been working on diet and exercise for T2 diabetes with CKD, on metformin ***, gimepiride ***, farxiga was ordered last visit and given coupon now that she has insurance ***, and denies {Symptoms; diabetes w/o none:19199}.  Patient does NOT check glucose - needle phobia and adamantly declines Admittedly poorly compliant with lifestyle and medications *** Last A1C in the office was:  Lab Results  Component Value Date   HGBA1C 8.5 (H) 08/01/2019   She has CKD I/II associated with T2DM monitored at this office:  Lab Results  Component Value Date   GFRNONAA 91 08/01/2019   Patient isn*** on Vitamin D supplement, 5000 IU when she remembers  Lab Results  Component Value Date   VD25OH 10 (L) 10/04/2018       Medication Review: Current Outpatient Medications on File Prior to Visit  Medication Sig Dispense Refill  . ALPRAZolam (XANAX) 0.5 MG tablet TAKE 1/2 TO 1 (ONE-HALF TO ONE) TABLET BY MOUTH ONCE DAILY AT BEDTIME AS NEEDED FOR ANXIETY OR SLEEP 30 tablet 0  . Bempedoic Acid-Ezetimibe (NEXLIZET) 180-10 MG TABS Take 1 tablet by mouth daily. 90 tablet 1  . CHOLECALCIFEROL PO Take 5,000 Units by mouth daily.    . citalopram (CELEXA) 40 MG tablet Take 1 tablet by mouth once daily 90 tablet 0  . dapagliflozin propanediol (FARXIGA) 10 MG TABS tablet Take 10 mg by mouth daily before breakfast. 90 tablet 1  . fenofibrate (TRICOR) 145 MG tablet Take 1 tablet (145 mg total) by mouth daily. 90 tablet 1  . glimepiride (AMARYL) 4 MG tablet Take 0.5-1 tablets (2-4 mg total) by mouth every morning. 90 tablet 1  . metFORMIN (GLUCOPHAGE) 500 MG tablet Take 4 tabs daily with meals; spread over the course of the day, take 2 tabs with largest meal. (Patient not taking: Reported on 04/17/2019) 360 tablet  1  . promethazine (PHENERGAN) 25 MG tablet Take 1 tablet (25 mg total) by mouth every 6 (six) hours as needed for nausea or vomiting. 30 tablet 3  . QUEtiapine (SEROQUEL) 25 MG tablet Take 1 tablet at Bedtime for Sleep 90 tablet 1  . rosuvastatin (CRESTOR) 40 MG tablet Take 1 tablet (40 mg total) by mouth daily. (Patient not taking: Reported on 01/10/2019) 90 tablet 1   No current facility-administered medications on file prior to visit.    Allergies  Allergen Reactions  . Codeine   . Glipizide Nausea And Vomiting  . Lunesta [Eszopiclone]     Excessive fatigue  . Welchol [Colesevelam Hcl]     nausea     Current Problems (verified) Patient Active Problem List   Diagnosis Date Noted  . Aortic atherosclerosis ([place]) 08/01/2019  . CKD stage 2 due to type 2 diabetes mellitus ([place]) 07/20/2019  . Smoker 04/16/2019  . Depression, major, recurrent, in partial remission ([place]) 01/10/2019  . Arthralgia of both feet 10/04/2018  . Overweight (BMI 25.0-29.9) 09/29/2017  .  Hyperlipidemia associated with type 2 diabetes mellitus ([place])   . Diabetes ([place])   . Colon cancer ([place])   . Vitamin D deficiency   . Anxiety   . Leukocytosis 09/18/2012    Screening Tests  There is no immunization history on file for this patient.  Preventative care: Last colonoscopy: *** Last mammogram: *** Last pap smear/pelvic exam: ***   DEXA:***  Prior vaccinations: TD or Tdap: ***  Influenza: ***  Pneumococcal: *** Prevnar13:  Shingles/Zostavax: **  Names of Other Physician/Practitioners you currently use: 1. [facility] here for primary care 2. ***, eye doctor, last visit *** 3. ***, dentist, last visit ***  Patient Care Team: Unk [place], MD as PCP - General (Internal Medicine) Wyatt Portela, MD as Consulting Physician (Oncology)  SURGICAL HISTORY She  has a past surgical history that includes Appendectomy. FAMILY HISTORY Her family history includes  Cancer in her mother; Diabetes in her sister; Heart disease in her brother; Hyperlipidemia in her mother and sister; Hypertension in her mother. SOCIAL HISTORY She  reports that she has been smoking cigarettes. She has been smoking about 0.75 packs per day. She has never used smokeless tobacco. She reports that she does not drink alcohol and does not use drugs.   MEDICARE WELLNESS OBJECTIVES: Physical activity:   Cardiac risk factors:   Depression/mood screen:   Depression screen [facility] 2/9 01/10/2019  Decreased Interest 0  Down, Depressed, Hopeless 0  PHQ - 2 Score 0  Altered sleeping 3  Tired, decreased energy 3  Change in appetite 2  Feeling bad or failure about yourself  0  Trouble concentrating 0  Moving slowly or fidgety/restless 0  Suicidal thoughts 0  PHQ-9 Score 8    ADLs:  No flowsheet data found.   Cognitive Testing  Alert? Yes  Normal Appearance?Yes  Oriented to person? Yes  Place? Yes   Time? Yes  Recall of three objects?  Yes  Can perform simple calculations? Yes  Displays appropriate judgment?Yes  Can read the correct time from a watch face?Yes  EOL planning:    Review of Systems  Constitutional: Negative for malaise/fatigue and weight loss.  HENT: Negative for hearing loss and tinnitus.   Eyes: Negative for blurred vision and double vision.  Respiratory: Negative for cough, sputum production, shortness of breath and wheezing.   Cardiovascular: Negative for chest pain, palpitations, orthopnea, claudication, leg swelling and PND.  Gastrointestinal: Negative for abdominal pain, blood in stool, constipation, diarrhea, heartburn, melena, nausea and vomiting.  Genitourinary: Negative.   Musculoskeletal: Negative for falls, joint pain and myalgias.  Skin: Negative for rash.  Neurological: Negative for dizziness, tingling, sensory change, weakness and headaches.  Endo/Heme/Allergies: Negative for polydipsia.  Psychiatric/Behavioral: Negative.  Negative for  depression, memory loss, substance abuse and suicidal ideas. The patient is not nervous/anxious and does not have insomnia.   All other systems reviewed and are negative.    Objective:     There were no vitals filed for this visit. There is no height or weight on file to calculate BMI.  General appearance: alert, no distress, WD/WN, female HEENT: normocephalic, sclerae anicteric, TMs pearly, nares patent, no discharge or erythema, pharynx normal Oral cavity: MMM, no lesions Neck: supple, no lymphadenopathy, no thyromegaly, no masses Heart: RRR, normal S1, S2, no murmurs Lungs: CTA bilaterally, no wheezes, rhonchi, or rales Abdomen: +bs, soft, non tender, non distended, no masses, no hepatomegaly, no splenomegaly Musculoskeletal: nontender, no swelling, no obvious deformity Extremities: no  edema, no cyanosis, no clubbing Pulses: 2+ symmetric, upper and lower extremities, normal cap refill Neurological: alert, oriented x 3, CN2-12 intact, strength normal upper extremities and lower extremities, sensation normal throughout, DTRs 2+ throughout, no cerebellar signs, gait normal Psychiatric: normal affect, behavior normal, pleasant   EKG: *** AAA [place]: ***  Medicare Attestation I have personally reviewed: The patient's medical and social history Their use of alcohol, tobacco or illicit drugs Their current medications and supplements The patient's functional ability including ADLs,fall risks, home safety risks, cognitive, and hearing and visual impairment Diet and physical activities Evidence for depression or mood disorders  The patient's weight, height, BMI, and visual acuity have been recorded in the chart.  I have made referrals, counseling, and provided education to the patient based on review of the above and I have provided the patient with a written personalized care plan for preventive services.     Izora Ribas, NP   11/20/2019

## 2019-11-21 ENCOUNTER — Ambulatory Visit: Payer: Medicare Other | Admitting: Adult Health

## 2019-11-27 NOTE — Progress Notes (Signed)
ACUTE VISIT  Assessment and Plan:   Aortic atherosclerosis ([name]) Control blood pressure, cholesterol, glucose, increase exercise.   Depression, major, recurrent, in partial remission ([place]) CONTINUE MEDS  Smoker -     DG Chest 2 View; Future  Left breast abscess -     WOUND CULTURE -     doxycycline (VIBRAMYCIN) 100 MG capsule; Take 1 capsule twice daily with food Discharge was green with odor, wound came up quickly, did have possible air in the wound but no crepitus around it.  Will get culture to look for MRSA versus pseudomonal infection Patient OVERDUE for [facility]- will do close monitoring in the office and get MGM when she has recovered some or if not healing.  The patient was advised to call immediately if she has any concerning symptoms in the interval. The patient voices understanding of current treatment options and is in agreement with the current care plan.The patient knows to call the clinic with any problems, questions or concerns or go to the ER if any further progression of symptoms.   Skin yeast infection -     nystatin cream (MYCOSTATIN); Apply 1 application topically 2 (two) times daily. Bilateral breast with erythematous rash Control sugars  PROCEDURE NOTE: I&D of Abscess Verbal consent obtained. Local anesthesia with 1 cc of 1% lidocaine. Site cleansed with alcohol  Incision of  1 cm was made using a 11 blade, discharge of copious amounts of malodrous pus with initial possible air involved and serosanguinous fluid.  Cleansed and dressed. After care instructions provided. Patient to return to clinic on 1 week for reevaluation/repacking.    Continue diet and meds as discussed. Further disposition pending results of labs. Discussed med's effects and SE's.   Over 40 minutes of exam, counseling, chart review, and critical decision making was performed.   Future Appointments  Date Time Provider [place]  12/10/2019  4:00 PM Liane Comber, NP [facility]  None    ----------------------------------------------------------------------------------------------------------------------  HPI 64 y.o. female, cash pay, presents for acute visit.   She is overdue for several health mantance issues due to lack of insurance, now has medicare. She started to have a pain in her left breast this past weekend, it quickly developed into a large, warm, tender nodule, some drainage. She has been putting hydrocortisone on it and ice packs. No fevers for her, some chills at night the last 2 nights.   She continues to smoke, has cut back to <1 pack a day.Last CXR 2013. States she has smoked 30 years less than a pack a day.   BMI is Body mass index is 27.46 kg/m., she has been working on diet, she is walking daily, she is drinking more water. Not checking sugars.  Very poorly motivated to work on lifestyle. Wt Readings from Last 3 Encounters:  11/28/19 155 lb (70.3 kg)  08/01/19 162 lb 9.6 oz (73.8 kg)  04/17/19 160 lb (72.6 kg)   She has aortic atherosclerosis per CT 2019.  Her blood pressure has been controlled at home, today their BP is BP: 116/70  She denies chest pain, shortness of breath, dizziness.   Current Medications:   Current Outpatient Medications (Endocrine & Metabolic):  .  dapagliflozin propanediol (FARXIGA) 10 MG TABS tablet, Take 10 mg by mouth daily before breakfast. .  glimepiride (AMARYL) 4 MG tablet, Take 0.5-1 tablets (2-4 mg total) by mouth every morning. .  metFORMIN (GLUCOPHAGE) 500 MG tablet, Take 4 tabs daily with meals; spread over the course of the  day, take 2 tabs with largest meal.  Current Outpatient Medications (Cardiovascular):  Marland Kitchen  Bempedoic Acid-Ezetimibe (NEXLIZET) 180-10 MG TABS, Take 1 tablet by mouth daily. .  fenofibrate (TRICOR) 145 MG tablet, Take 1 tablet (145 mg total) by mouth daily. .  rosuvastatin (CRESTOR) 40 MG tablet, Take 1 tablet (40 mg total) by mouth daily.  Current Outpatient Medications  (Respiratory):  .  promethazine (PHENERGAN) 25 MG tablet, Take 1 tablet (25 mg total) by mouth every 6 (six) hours as needed for nausea or vomiting.    Current Outpatient Medications (Other):  Marland Kitchen  ALPRAZolam (XANAX) 0.5 MG tablet, TAKE 1/2 TO 1 (ONE-HALF TO ONE) TABLET BY MOUTH ONCE DAILY AT BEDTIME AS NEEDED FOR ANXIETY OR SLEEP .  CHOLECALCIFEROL PO, Take 5,000 Units by mouth daily. .  citalopram (CELEXA) 40 MG tablet, Take 1 tablet by mouth once daily .  QUEtiapine (SEROQUEL) 25 MG tablet, Take 1 tablet at Bedtime for Sleep   Allergies:  Allergies  Allergen Reactions  . Codeine   . Glipizide Nausea And Vomiting  . Lunesta [Eszopiclone]     Excessive fatigue  . Welchol [Colesevelam Hcl]     nausea      Medical History:  Past Medical History:  Diagnosis Date  . Anxiety   . Cervical dysplasia   . Colon cancer ([place])   . Depression   . Diabetes ([place])   . History of colon cancer, stage II 09/18/2012  . Hyperlipidemia   . Right-sided Bell's palsy 01/03/2019  . Vitamin D deficiency    Surgical History: reviewed and unchanged Family History: reviewed and unchanged Social History: reviewed and unchanged  Review of Systems:  Review of Systems  Constitutional: Negative for malaise/fatigue and weight loss.  HENT: Negative for hearing loss and tinnitus.   Eyes: Negative for blurred vision and double vision.  Respiratory: Negative for cough, shortness of breath and wheezing.   Cardiovascular: Negative for chest pain, palpitations, orthopnea, claudication and leg swelling.  Gastrointestinal: Positive for abdominal pain and constipation. Negative for blood in stool, diarrhea, heartburn, melena, nausea and vomiting.  Genitourinary: Negative.   Musculoskeletal: Negative for joint pain (feet improved with tumeric) and myalgias.  Skin: Negative for rash.  Neurological: Negative for dizziness, tingling, sensory change, weakness and headaches.  Endo/Heme/Allergies: Negative for  polydipsia.  Psychiatric/Behavioral: Negative for depression, substance abuse and suicidal ideas. The patient has insomnia. The patient is not nervous/anxious.   All other systems reviewed and are negative.   Physical Exam: BP 116/70   Pulse 91   Temp (!) 97.5 F (36.4 C)   Wt 155 lb (70.3 kg)   SpO2 96%   BMI 27.46 kg/m  Wt Readings from Last 3 Encounters:  11/28/19 155 lb (70.3 kg)  08/01/19 162 lb 9.6 oz (73.8 kg)  04/17/19 160 lb (72.6 kg)   General Appearance: Well nourished, in no apparent distress. Eyes: PERRLA, EOMs, conjunctiva no swelling or erythema Sinuses: No Frontal/maxillary tenderness ENT/Mouth: Ext aud canals clear, TMs without erythema, bulging. No erythema, swelling, or exudate on post pharynx.  Tonsils not swollen or erythematous. Skin: left medial breast with erythematous, warm, 4 cm x 3 cm hard nodule with fluctuance and yellow exudate/head.  Neck: Supple, thyroid normal.  Respiratory: Respiratory effort normal with wheezing right lower lobe,  without rales, rhonchi, or stridor.  Cardio: RRR with no MRGs. Brisk peripheral pulses without edema.  Abdomen: Soft, + BS.  Non tender no guarding, rebound, hernias, palpable masses. No CVA tenderness.  Lymphatics:  Non tender without lymphadenopathy.  Musculoskeletal: Full ROM, 5/5 strength, Normal gait. No spinous tenderness, has paraspinal tenderness without spasm.  Skin: Warm, dry without rashes, lesions, ecchymosis.  Neuro: Cranial nerves intact. No cerebellar symptoms.  Psych: Awake and oriented X 3, depressed/sad affect, Insight and Judgment appropriate.      [name], PA-C 11:50 AM [facility]

## 2019-11-28 ENCOUNTER — Encounter: Payer: Self-pay | Admitting: Physician Assistant

## 2019-11-28 ENCOUNTER — Other Ambulatory Visit: Payer: Self-pay

## 2019-11-28 ENCOUNTER — Ambulatory Visit (INDEPENDENT_AMBULATORY_CARE_PROVIDER_SITE_OTHER): Payer: Medicare Other | Admitting: Physician Assistant

## 2019-11-28 VITALS — BP 116/70 | HR 91 | Temp 97.5°F | Wt 155.0 lb

## 2019-11-28 DIAGNOSIS — F3341 Major depressive disorder, recurrent, in partial remission: Secondary | ICD-10-CM | POA: Diagnosis not present

## 2019-11-28 DIAGNOSIS — B372 Candidiasis of skin and nail: Secondary | ICD-10-CM

## 2019-11-28 DIAGNOSIS — F17200 Nicotine dependence, unspecified, uncomplicated: Secondary | ICD-10-CM | POA: Diagnosis not present

## 2019-11-28 DIAGNOSIS — N611 Abscess of the breast and nipple: Secondary | ICD-10-CM | POA: Diagnosis not present

## 2019-11-28 DIAGNOSIS — I700 Atherosclerosis of aorta: Secondary | ICD-10-CM

## 2019-11-28 MED ORDER — NYSTATIN 100000 UNIT/GM EX CREA: 1.0000 "application " | TOPICAL_CREAM | Freq: Two times a day (BID) | CUTANEOUS | 1 refills | Status: DC

## 2019-11-28 MED ORDER — DOXYCYCLINE HYCLATE 100 MG PO CAPS: ORAL_CAPSULE | ORAL | 0 refills | Status: DC

## 2019-11-28 NOTE — Patient Instructions (Addendum)
Please get your eye exam  ABSCESS  Continue antibiotic as prescribed.  Continue warm wet compresses and you can buy a sitz bath from [facility] and do 2-3 times a day  Call if any worsening discharge, fever, chills, warmth, redness.   Abscess An abscess is an infected area that contains a collection of pus and debris. It can occur in almost any part of the body. An abscess is also known as a furuncle or boil. CAUSES  An abscess occurs when tissue gets infected. This can occur from blockage of oil or sweat glands, infection of hair follicles, or a minor injury to the skin. As the body tries to fight the infection, pus collects in the area and creates pressure under the skin. This pressure causes pain. People with weakened immune systems have difficulty fighting infections and get certain abscesses more often.  SYMPTOMS Usually an abscess develops on the skin and becomes a painful mass that is red, warm, and tender. If the abscess forms under the skin, you may feel a moveable soft area under the skin. Some abscesses break open (rupture) on their own, but most will continue to get worse without care. The infection can spread deeper into the body and eventually into the bloodstream, causing you to feel ill.  TREATMENT  Your caregiver may prescribe antibiotic medicines to fight the infection. However, taking antibiotics alone usually does not cure an abscess. Your caregiver may need to make a small cut (incision) in the abscess to drain the pus. In some cases, gauze is packed into the abscess to reduce pain and to continue draining the area. HOME CARE INSTRUCTIONS   Only take over-the-counter or prescription medicines for pain, discomfort, or fever as directed by your caregiver.  If you were prescribed antibiotics, take them as directed. Finish them even if you start to feel better.  If gauze is used, follow your caregiver's directions for changing the gauze.  To avoid spreading the  infection:  Keep your draining abscess covered with a bandage.  Wash your hands well.  Do not share personal care items, towels, or whirlpools with others.  Avoid skin contact with others.  Keep your skin and clothes clean around the abscess.  Keep all follow-up appointments as directed by your caregiver. SEEK MEDICAL CARE IF:   You have increased pain, swelling, redness, fluid drainage, or bleeding.  You have muscle aches, chills, or a general ill feeling.  You have a fever. MAKE SURE YOU:   Understand these instructions.  Will watch your condition.  Will get help right away if you are not doing well or get worse.   YEAST RASH UNDER BREAST  Use the nystatin cream  twice a day to treat it Make sure towel dry well and use the powder    Skin Yeast Infection Skin yeast infection is a condition in which there is an overgrowth of yeast (candida) that normally lives on the skin. This condition usually occurs in areas of the skin that are constantly warm and moist, such as the armpits or the groin. What are the causes? This condition is caused by a change in the normal balance of the yeast and bacteria that live on the skin. What increases the risk? This condition is more likely to develop in:  People who are obese.  Pregnant women.  Women who take birth control pills.  People who have diabetes.  People who take antibiotic medicines.  People who take steroid medicines.  People who are malnourished.  People who have a weak defense (immune) system.  People who are 66 years of age or older.  What are the signs or symptoms? Symptoms of this condition include:  A red, swollen area of the skin.  Bumps on the skin.  Itchiness.  How is this diagnosed? This condition is diagnosed with a medical history and physical exam. Your health care provider may check for yeast by taking light scrapings of the skin to be viewed under a microscope. How is this treated? This  condition is treated with medicine. Medicines may be prescribed or be available over-the-counter. The medicines may be:  Taken by mouth (orally).  Applied as a cream.  Follow these instructions at home:  Take or apply over-the-counter and prescription medicines only as told by your health care provider.  Eat more yogurt. This may help to keep your yeast infection from returning.  Maintain a healthy weight. If you need help losing weight, talk with your health care provider.  Keep your skin clean and dry.  If you have diabetes, keep your blood sugar under control. Contact a health care provider if:  Your symptoms go away and then return.  Your symptoms do not get better with treatment.  Your symptoms get worse.  Your rash spreads.  You have a fever or chills.  You have new symptoms.  You have new warmth or redness of your skin. This information is not intended to replace advice given to you by your health care provider. Make sure you discuss any questions you have with your health care provider. Document Released: 02/02/2011 Document Revised: 01/11/2016 Document Reviewed: 11/18/2014 Elsevier Interactive Patient Education  2018 [place]   INFORMATION ABOUT YOUR XRAY [facility] Can walk into [address] building for an Insurance account manager. They will have the order and take you back. You do not any paper work, I should get the result back today or tomorrow. This order is good for a year.  Can call [phone] to schedule an appointment if you wish.    Ask insurance and pharmacy about shingrix - it is a 2 part shot that we will not be getting in the office.   Suggest getting AFTER covid vaccines, have to wait at least a month This shot can make you feel bad due to such good immune response it can trigger some inflammation so take tylenol or aleve day of or day after and plan on resting.   Can go to [email or web address] for  more information  Shingrix Vaccination  Two vaccines are licensed and recommended to prevent shingles in the [place] Zoster vaccine live (ZVL, Zostavax) has been in use since 2006. Recombinant zoster vaccine (RZV, Shingrix), has been in use since 2017 and is recommended by AC[employer] as the preferred shingles vaccine.  What Everyone Should Know about Shingles Vaccine (Shingrix) One of the Recommended Vaccines by Disease Shingles vaccination is the only way to protect against shingles and postherpetic neuralgia (PHN), the most common complication from shingles. CDC recommends that healthy adults 50 years and older get two doses of the shingles vaccine called Shingrix (recombinant zoster vaccine), separated by 2 to 6 months, to prevent shingles and the complications from the disease. Your doctor or pharmacist can give you Shingrix as a shot in your upper arm. Shingrix provides strong protection against shingles and PHN. Two doses of Shingrix is more than 90% effective at preventing shingles and PHN. Protection stays above 85% for at least the first four years after you get vaccinated. Shingrix  is the preferred vaccine, over Zostavax (zoster vaccine live), a shingles vaccine in use since 2006. Zostavax may still be used to prevent shingles in healthy adults 60 years and older. For example, you could use Zostavax if a person is allergic to Shingrix, prefers Zostavax, or requests immediate vaccination and Shingrix is unavailable. Who Should Get Shingrix? Healthy adults 50 years and older should get two doses of Shingrix, separated by 2 to 6 months. You should get Shingrix even if in the past you . had shingles  . received Zostavax  . are not sure if you had chickenpox There is no maximum age for getting Shingrix. If you had shingles in the past, you can get Shingrix to help prevent future occurrences of the disease. There is no specific length of time that you need to wait after having shingles before you can  receive Shingrix, but generally you should make sure the shingles rash has gone away before getting vaccinated. You can get Shingrix whether or not you remember having had chickenpox in the past. Studies show that more than 99% of Americans 40 years and older have had chickenpox, even if they don't remember having the disease. Chickenpox and shingles are related because they are caused by the same virus (varicella zoster virus). After a person recovers from chickenpox, the virus stays dormant (inactive) in the body. It can reactivate years later and cause shingles. If you had Zostavax in the recent past, you should wait at least eight weeks before getting Shingrix. Talk to your healthcare provider to determine the best time to get Shingrix. Shingrix is available in [facility] and pharmacies. To find doctor's offices or pharmacies near you that offer the vaccine, visit Health[facility] Vaccine FinderExternal. If you have questions about Shingrix, talk with your healthcare provider. Vaccine for Those 1 Years and Older  Shingrix reduces the risk of shingles and PHN by more than 90% in people 52 and older. CDC recommends the vaccine for healthy adults 28 and older.  Who Should Not Get Shingrix? You should not get Shingrix if you: . have ever had a severe allergic reaction to any component of the vaccine or after a dose of Shingrix  . tested negative for immunity to varicella zoster virus. If you test negative, you should get chickenpox vaccine.  . currently have shingles  . currently are pregnant or breastfeeding. Women who are pregnant or breastfeeding should wait to get Shingrix.  Marland Kitchen receive specific antiviral drugs (acyclovir, famciclovir, or valacyclovir) 24 hours before vaccination (avoid use of these antiviral drugs for 14 days after vaccination)- zoster vaccine live only If you have a minor acute (starts suddenly) illness, such as a cold, you may get Shingrix. But if you have a moderate or severe  acute illness, you should usually wait until you recover before getting the vaccine. This includes anyone with a temperature of 101.17F or higher. The side effects of the Shingrix are temporary, and usually last 2 to 3 days. While you may experience pain for a few days after getting Shingrix, the pain will be less severe than having shingles and the complications from the disease. How Well Does Shingrix Work? Two doses of Shingrix provides strong protection against shingles and postherpetic neuralgia (PHN), the most common complication of shingles. . In adults 26 to 66 years old who got two doses, Shingrix was 97% effective in preventing shingles; among adults 70 years and older, Shingrix was 91% effective.  . In adults 42 to 66 years old who  got two doses, Shingrix was 91% effective in preventing PHN; among adults 70 years and older, Shingrix was 89% effective. Shingrix protection remained high (more than 85%) in people 70 years and older throughout the four years following vaccination. Since your risk of shingles and PHN increases as you get older, it is important to have strong protection against shingles in your older years. Top of Page  What Are the Possible Side Effects of Shingrix? Studies show that Shingrix is safe. The vaccine helps your body create a strong defense against shingles. As a result, you are likely to have temporary side effects from getting the shots. The side effects may affect your ability to do normal daily activities for 2 to 3 days. Most people got a sore arm with mild or moderate pain after getting Shingrix, and some also had redness and swelling where they got the shot. Some people felt tired, had muscle pain, a headache, shivering, fever, stomach pain, or nausea. About 1 out of 6 people who got Shingrix experienced side effects that prevented them from doing regular activities. Symptoms went away on their own in about 2 to 3 days. Side effects were more common in younger  people. You might have a reaction to the first or second dose of Shingrix, or both doses. If you experience side effects, you may choose to take over-the-counter pain medicine such as ibuprofen or acetaminophen. If you experience side effects from Shingrix, you should report them to the Vaccine Adverse Event Reporting System (VAERS). Your doctor might file this report, or you can do it yourself through the VAERS websiteExternal, or by calling [phone]. If you have any questions about side effects from Shingrix, talk with your doctor. The shingles vaccine does not contain thimerosal (a preservative containing mercury). Top of Page  When Should I See a Doctor Because of the Side Effects I Experience From Shingrix? In clinical trials, Shingrix was not associated with serious adverse events. In fact, serious side effects from vaccines are extremely rare. For example, for every 1 million doses of a vaccine given, only one or two people may have a severe allergic reaction. Signs of an allergic reaction happen within minutes or hours after vaccination and include hives, swelling of the face and throat, difficulty breathing, a fast heartbeat, dizziness, or weakness. If you experience these or any other life-threatening symptoms, see a doctor right away. Shingrix causes a strong response in your immune system, so it may produce short-term side effects more intense than you are used to from other vaccines. These side effects can be uncomfortable, but they are expected and usually go away on their own in 2 or 3 days. Top of Page  How Can I Pay For Shingrix? There are several ways shingles vaccine may be paid for: Medicare . Medicare Part D plans cover the shingles vaccine, but there may be a cost to you depending on your plan. There may be a copay for the vaccine, or you may need to pay in full then get reimbursed for a certain amount.  . Medicare Part B does not cover the shingles  vaccine. Medicaid . Medicaid may or may not cover the vaccine. Contact your insurer to find out. Private health insurance . Many private health insurance plans will cover the vaccine, but there may be a cost to you depending on your plan. Contact your insurer to find out. Vaccine assistance programs . Some pharmaceutical companies provide vaccines to eligible adults who cannot afford them. You may want  to check with the vaccine manufacturer, [employer], about Shingrix. If you do not currently have health insurance, learn more about affordable health coverage optionsExternal. To find doctor's offices or pharmacies near you that offer the vaccine, visit HealthMap Vaccine FinderExternal.   Here is some information about the vaccines. The data is very good and this information hopefully answers a lot of your questions and give you a confidence boost.   The Pfizer and Moderna vaccines are messenger RNA vaccines. That technology is not new, it has been studied for 20 years at least, used for cancer and MS treatment. They had started using the vaccine for MERS AND SARS (both a different coronavirus) 10 years ago but never finished so we had a good backbone for this vaccine.   There were no short cuts with the techniques for these clinical  trials, just lots of willing participants quickly and lots of up front money helped speed up the process.   The mRNA is very fragile which is why it needs to be kept so cold and thawed a certain way, think of it as a message in a glass bottle. NO PART of the virus is in this vaccine, it is a clip of the genetic sequence. This mRNA is injected in your arm, connects with a ribosome, delivers the message and the degrades.  That is part of the cause of the sore arm, the mRNA never leaves your arm. It degrades there. The mRNA does not go into our nucleotide where our DNA is, and we would need a DNA reverse transcriptase to take RNA to DNA, we do not have this, it can not  change our DNA. The ribosome that got the message creates a protein and that protein circulates in our body and we have an immune reaction to that creating antibodies. Any time our immune system is triggered, inflammation is triggered too so you can have a temp, muscle aches, etc. Normal reaction.  I have seen so many patients that just had mild COVID in the office weeks later still have issues. We are still learning about post COVID syndrome, the CDC should be coming out for guidelines for practioners soon. There is too much unknown about COVID. We have been using vaccines for over 100 years or more, i Personnel officer. Ask your parents or any older friends about polio and that vaccine, that was a disease shutting down schools,had kids in iron lung, devastating young kids and families. People lined up for that vaccine and technology has only improved.   Please get the vaccine. If you have any further questions please make an appointment in the office to discuss further.  [name]

## 2019-12-01 LAB — WOUND CULTURE
MICRO NUMBER:: 10657113
RESULT:: NO GROWTH
SPECIMEN QUALITY:: ADEQUATE

## 2019-12-05 NOTE — Progress Notes (Deleted)
ACUTE VISIT  Assessment and Plan:   Aortic atherosclerosis ([name]) Control blood pressure, cholesterol, glucose, increase exercise.   Depression, major, recurrent, in partial remission ([place]) CONTINUE MEDS  Smoker -     DG Chest 2 View; Future  Left breast abscess -     WOUND CULTURE -     doxycycline (VIBRAMYCIN) 100 MG capsule; Take 1 capsule twice daily with food Discharge was green with odor, wound came up quickly, did have possible air in the wound but no crepitus around it.  Will get culture to look for MRSA versus pseudomonal infection Patient OVERDUE for [facility]- will do close monitoring in the office and get MGM when she has recovered some or if not healing.  The patient was advised to call immediately if she has any concerning symptoms in the interval. The patient voices understanding of current treatment options and is in agreement with the current care plan.The patient knows to call the clinic with any problems, questions or concerns or go to the ER if any further progression of symptoms.   Skin yeast infection -     nystatin cream (MYCOSTATIN); Apply 1 application topically 2 (two) times daily. Bilateral breast with erythematous rash Control sugars  PROCEDURE NOTE: I&D of Abscess Verbal consent obtained. Local anesthesia with 1 cc of 1% lidocaine. Site cleansed with alcohol  Incision of  1 cm was made using a 11 blade, discharge of copious amounts of malodrous pus with initial possible air involved and serosanguinous fluid.  Cleansed and dressed. After care instructions provided. Patient to return to clinic on 1 week for reevaluation/repacking.    Continue diet and meds as discussed. Further disposition pending results of labs. Discussed med's effects and SE's.   Over 40 minutes of exam, counseling, chart review, and critical decision making was performed.   Future Appointments  Date Time Provider [place]  12/06/2019  3:30 PM [name], PA-C [facility]  None  12/10/2019  4:00 PM Liane Comber, NP [facility] None    ----------------------------------------------------------------------------------------------------------------------  HPI 64 y.o. female, cash pay, presents for acute visit.   She is overdue for several health mantance issues due to lack of insurance, now has medicare. She started to have a pain in her left breast this past weekend, it quickly developed into a large, warm, tender nodule, some drainage. She has been putting hydrocortisone on it and ice packs. No fevers for her, some chills at night the last 2 nights.   She continues to smoke, has cut back to <1 pack a day.Last CXR 2013. States she has smoked 30 years less than a pack a day.   BMI is There is no height or weight on file to calculate BMI., she has been working on diet, she is walking daily, she is drinking more water. Not checking sugars.  Very poorly motivated to work on lifestyle. Wt Readings from Last 3 Encounters:  11/28/19 155 lb (70.3 kg)  08/01/19 162 lb 9.6 oz (73.8 kg)  04/17/19 160 lb (72.6 kg)   She has aortic atherosclerosis per CT 2019.  Her blood pressure has been controlled at home, today their BP is    She denies chest pain, shortness of breath, dizziness.   Current Medications:   Current Outpatient Medications (Endocrine & Metabolic):  .  dapagliflozin propanediol (FARXIGA) 10 MG TABS tablet, Take 10 mg by mouth daily before breakfast. .  glimepiride (AMARYL) 4 MG tablet, Take 0.5-1 tablets (2-4 mg total) by mouth every morning. .  metFORMIN (GLUCOPHAGE)  500 MG tablet, Take 4 tabs daily with meals; spread over the course of the day, take 2 tabs with largest meal.  Current Outpatient Medications (Cardiovascular):  Marland Kitchen  Bempedoic Acid-Ezetimibe (NEXLIZET) 180-10 MG TABS, Take 1 tablet by mouth daily. .  fenofibrate (TRICOR) 145 MG tablet, Take 1 tablet (145 mg total) by mouth daily. .  rosuvastatin (CRESTOR) 40 MG tablet, Take 1 tablet (40  mg total) by mouth daily.  Current Outpatient Medications (Respiratory):  .  promethazine (PHENERGAN) 25 MG tablet, Take 1 tablet (25 mg total) by mouth every 6 (six) hours as needed for nausea or vomiting.    Current Outpatient Medications (Other):  Marland Kitchen  ALPRAZolam (XANAX) 0.5 MG tablet, TAKE 1/2 TO 1 (ONE-HALF TO ONE) TABLET BY MOUTH ONCE DAILY AT BEDTIME AS NEEDED FOR ANXIETY OR SLEEP .  CHOLECALCIFEROL PO, Take 5,000 Units by mouth daily. .  citalopram (CELEXA) 40 MG tablet, Take 1 tablet by mouth once daily .  doxycycline (VIBRAMYCIN) 100 MG capsule, Take 1 capsule twice daily with food .  nystatin cream (MYCOSTATIN), Apply 1 application topically 2 (two) times daily. .  QUEtiapine (SEROQUEL) 25 MG tablet, Take 1 tablet at Bedtime for Sleep   Allergies:  Allergies  Allergen Reactions  . Codeine   . Glipizide Nausea And Vomiting  . Lunesta [Eszopiclone]     Excessive fatigue  . Welchol [Colesevelam Hcl]     nausea      Medical History:  Past Medical History:  Diagnosis Date  . Anxiety   . Cervical dysplasia   . Colon cancer ([place])   . Depression   . Diabetes ([place])   . History of colon cancer, stage II 09/18/2012  . Hyperlipidemia   . Right-sided Bell's palsy 01/03/2019  . Vitamin D deficiency    Surgical History: reviewed and unchanged Family History: reviewed and unchanged Social History: reviewed and unchanged  Review of Systems:  Review of Systems  Constitutional: Negative for malaise/fatigue and weight loss.  HENT: Negative for hearing loss and tinnitus.   Eyes: Negative for blurred vision and double vision.  Respiratory: Negative for cough, shortness of breath and wheezing.   Cardiovascular: Negative for chest pain, palpitations, orthopnea, claudication and leg swelling.  Gastrointestinal: Positive for abdominal pain and constipation. Negative for blood in stool, diarrhea, heartburn, melena, nausea and vomiting.  Genitourinary: Negative.   Musculoskeletal:  Negative for joint pain (feet improved with tumeric) and myalgias.  Skin: Negative for rash.  Neurological: Negative for dizziness, tingling, sensory change, weakness and headaches.  Endo/Heme/Allergies: Negative for polydipsia.  Psychiatric/Behavioral: Negative for depression, substance abuse and suicidal ideas. The patient has insomnia. The patient is not nervous/anxious.   All other systems reviewed and are negative.   Physical Exam: There were no vitals taken for this visit. Wt Readings from Last 3 Encounters:  11/28/19 155 lb (70.3 kg)  08/01/19 162 lb 9.6 oz (73.8 kg)  04/17/19 160 lb (72.6 kg)   General Appearance: Well nourished, in no apparent distress. Eyes: PERRLA, EOMs, conjunctiva no swelling or erythema Sinuses: No Frontal/maxillary tenderness ENT/Mouth: Ext aud canals clear, TMs without erythema, bulging. No erythema, swelling, or exudate on post pharynx.  Tonsils not swollen or erythematous. Skin: left medial breast with erythematous, warm, 4 cm x 3 cm hard nodule with fluctuance and yellow exudate/head.  Neck: Supple, thyroid normal.  Respiratory: Respiratory effort normal with wheezing right lower lobe,  without rales, rhonchi, or stridor.  Cardio: RRR with no MRGs. Brisk peripheral pulses without edema.  Abdomen: Soft, + BS.  Non tender no guarding, rebound, hernias, palpable masses. No CVA tenderness.  Lymphatics: Non tender without lymphadenopathy.  Musculoskeletal: Full ROM, 5/5 strength, Normal gait. No spinous tenderness, has paraspinal tenderness without spasm.  Skin: Warm, dry without rashes, lesions, ecchymosis.  Neuro: Cranial nerves intact. No cerebellar symptoms.  Psych: Awake and oriented X 3, depressed/sad affect, Insight and Judgment appropriate.      [name], PA-C 8:48 PM [facility]

## 2019-12-06 ENCOUNTER — Ambulatory Visit: Payer: Medicare Other | Admitting: Physician Assistant

## 2019-12-07 DIAGNOSIS — Z91199 Patient's noncompliance with other medical treatment and regimen due to unspecified reason: Secondary | ICD-10-CM | POA: Insufficient documentation

## 2019-12-07 NOTE — Progress Notes (Signed)
MEDICARE ANNUAL WELLNESS VISIT AND FOLLOW UP  Assessment:    Dorothy Boyd was seen today for follow-up and medicare wellness.  Diagnoses and all orders for this visit:  Welcome to Medicare preventive visit Due annually Declines vaccines except 23 valent pneumonia today  Declines colon cancer screening or follow up  Insufficient time to discuss all screening adequately, will continue at follow up visit, overdue due to no insurance until getting medicare Mammogram and DEXA ordered  Declines PAP Hep C screening done today  -     EKG 12-Lead -     [place], RETROPERITNL ABD,  LTD  Aortic atherosclerosis (Dorothy Boyd) Per CT 2019 Control blood pressure, cholesterol, glucose, increase exercise.  -     Lipid panel  Malignant neoplasm of colon, unspecified part of colon (Dorothy Boyd) Hx of, s/p resection, overdue for screening/follow up, PATIENT DECLINES ADAMANTLY  Hyperlipidemia associated with type 2 diabetes mellitus (Dorothy Boyd) Complicated by poor compliance and med SE ? Did tolerate rosuvastatin vs fenofibrate; advised restart 1 at a time, rosuvastatin first and document tolerance LDL goal <70 Continue low cholesterol diet and exercise.  Check lipid panel.  -     Lipid panel -     TSH  Type 2 diabetes mellitus with stage 1 chronic kidney disease, with long-term current use of insulin ([name]) Type 2 Diabetes Mellitus - poorly controlled Complicated by non-compliance, med intolerance Tolerates metformin 1-2 tabs/day Intolerance: glipizide/glimepiride, trulicity, JMEQ6S (numerous) ABSOLUTELY DECLINES INSULIN OR CHECKING GLUCOSE Education: Reviewed 'ABCs' of diabetes management (respective goals in parentheses):  A1C (<7), blood pressure (<130/80), and cholesterol (LDL <70) Eye Exam yearly and Dental Exam every 6 months.- eye report requested Dietary recommendations Physical Activity recommendations -     COMPLETE METABOLIC PANEL WITH GFR -     Hemoglobin A1c  CKD stage 2 due to type 2 diabetes mellitus  ([name]) Increase fluids, avoid NSAIDS, monitor sugars, will monitor  Depression, major, recurrent, in partial remission (Dorothy Boyd) Stop celexa, will try lexapro 20 mg daily  Follow up sooner if SE or other concerns, continue seroquel Lifestyle discussed: diet/exerise, sleep hygiene, stress management, hydration -     escitalopram (LEXAPRO) 20 MG tablet; Take 1 tablet (20 mg total) by mouth daily.  Vitamin D deficiency Encouraged taking supplement consistently, check levels next visit   Smoker Discussed risks associated with tobacco use and advised to reduce or quit Patient is not ready to do so, but advised to consider strongly Will follow up at the next visit -lung cancer screening with low dose CT discussed as recommended by guidelines based on age, number of pack year history.  Discussed risks of screening including but not limited to false positives on xray, further testing or consultation with specialist, and possible false negative CT as well. Understanding expressed and wishes to proceed with CT testing. Order placed.  -     Low dose CT chest lung cancer screening -     EKG 12-Lead -     [place], RETROPERITNL ABD,  LTD  Overweight (BMI 25.0-29.9) Continue to recommend diet heavy in fruits and veggies and low in animal meats, cheeses, and dairy products, appropriate calorie intake Discuss exercise recommendations routinely Continue to monitor weight at each visit  Leukocytosis, unspecified type Follow up [name] -     CBC with Differential/Platelet  Arthralgia of both feet Continue tylenol, monitor   Anxiety Will try lexapro, follow up 12 weeks Stress management techniques discussed, increase water, good sleep hygiene discussed, increase exercise, and increase veggies.  Call the office if any new AE's from medications and we will switch them  Poor compliance Continue to discuss barriers, encourage compliance  Need for hepatitis C screening test -     Hepatitis C  antibody  Encounter for routine adult health examination without abnormal findings -     EKG 12-Lead -     [place], RETROPERITNL ABD,  LTD  Elevated BP without diagnosis of hypertension -     COMPLETE METABOLIC PANEL WITH GFR -     Magnesium -     EKG 12-Lead -     [place], RETROPERITNL ABD,  LTD  Family history of abdominal aortic aneurysm (AAA) -     [place], RETROPERITNL ABD,  LTD  Left breast abscess -     MM Digital Diagnostic Bilat; Future -     US BREAST LTD UNI LEFT INC AXILLA; Future  Estrogen deficiency -     DG Bone Density; Future  Over 40 minutes of exam, counseling, chart review and critical decision making was performed No future appointments.   Plan:   During the course of the visit the patient was educated and counseled about appropriate screening and preventive services including:    Pneumococcal vaccine   Prevnar 13  Influenza vaccine  Td vaccine  Screening electrocardiogram  Bone densitometry screening  Colorectal cancer screening  Diabetes screening  Glaucoma screening  Nutrition counseling   Advanced directives: requested   Subjective:  Dorothy Boyd is a 64 y.o. female who presents for Medicare Annual Wellness Visit and 3 month follow up.   She was found to have abscess of chest, [name], [place], did I&D on 6/30 with malodorus pus, culture showed G+ cocci in clusters however without significant growth, no abx sensitivity, was prescribed doxycycline 100 mg BID x 10 days which the patient states she completed, didn't perceive improvement with this, still has ongoing purulent drainage. Notably patient hasn't had mammogram in several years, hx of clip placed in left breast.   She has hx of colon cancer in 2010, s/p resection and established with [name]; she has had unexplained leukocytosis and microcytosis documented since 2010.  Etiology remains unclear with possible myeloproliferative disorder and was referred back to Oncology for evaluation and was  felt to be stable.  she has a diagnosis of depression and is currently on celexa 40 mg daily, reports symptoms are not controlled on current regimen, would like to try alternative agent. she additionally has insomnia, taking seroquel 25 mg at night but while helps with mood hasn't improved sleep. She tried trazodone 75 mg-150 mg which she reports wasn't effective, gabapentin 300 mg 1-3 caps, didn't help with pain or sleep per patient. She has been on xanax, takes 0.25 mg as needed for sleep.   She continues to smoke, has cut back to <1 pack a day, but has 30+ year smoking history, since 1978. Denies cough, dyspnea.   BMI is Body mass index is 27.99 kg/m., she has not been working on diet and exercise. she has been working on diet, she is walking doing stairs a few days a week, 10-15 min.  Feet aching and bother her, saw podiatry and advised arthritis and taking tylenol with some benefit though limits time on her feet.  She is still drinking a some diet soda daily (3 x 12 oz), drinks 2 cups of water daily.  She admits eating irregular, some times only 1 meal a day, typically has sandwich with multigrain bread, crackers, etc, and  will eat fruit. Very poorly motivated to work on lifestyle. Wt Readings from Last 3 Encounters:  12/10/19 158 lb (71.7 kg)  11/28/19 155 lb (70.3 kg)  08/01/19 162 lb 9.6 oz (73.8 kg)   Has aortic atherosclerosis per CT 11/2017 She does have BP cuff, doesn't check due to typically well conrolled in 110-120s/60-70s, today their BP is BP: 136/80 She does not workout. She denies chest pain, shortness of breath, dizziness.   She is on cholesterol medication (rosuvastatin 40 mg and fenofibrate 145 mg daily but stopped taking, unsure why, does have hx of stomach sx with lots of meds) and denies myalgias. Her cholesterol is not at goal. The cholesterol last visit was:   Lab Results  Component Value Date   CHOL 273 (H) 08/01/2019   HDL 29 (L) 08/01/2019   [place]  08/01/2019      Comment:     . LDL cholesterol not calculated. Triglyceride levels greater than 400 mg/dL invalidate calculated LDL results. . Reference range: <100 . Desirable range <100 mg/dL for primary prevention;   <70 mg/dL for patients with CHD or diabetic patients  with > or = 2 CHD risk factors. Marland Kitchen LDL-C is now calculated using the [name]  calculation, which is a validated novel method providing  better accuracy than the Friedewald equation in the  estimation of LDL-C.  [name] et al. [name]. 9470;962(83): 2061-2068  (http://education.[employer].com/faq/FAQ164)    TRIG 437 (H) 08/01/2019   CHOLHDL 9.4 (H) 08/01/2019   She has had diabetes for 10 years.   She has been working on diet (limiting sweets) for T2 diabetes with CKD, on metformin  - takes 1-2 tabs/day (has GI SE with more than this) , gimepiride - but not taking (stomach pain), didn't do well with SGLT2i (was given numerous samples, reports had persistent yeast while taking) and denies foot ulcerations, increased appetite, nausea, paresthesia of the feet, polydipsia, polyuria, visual disturbances and weight loss.  Patient does NOT check glucose - needle phobia and adamantly declines Admittedly poorly compliant with lifestyle and medications due to intolerance  Last A1C in the office was:  Lab Results  Component Value Date   HGBA1C 8.5 (H) 08/01/2019   She has CKD I/II associated with T2DM monitored at this office:  Lab Results  Component Value Date   GFRNONAA 91 08/01/2019   Patient is on Vitamin D supplement, 5000 IU gummies, when she remembers  Lab Results  Component Value Date   VD25OH 10 (L) 10/04/2018       Medication Review: Current Outpatient Medications on File Prior to Visit  Medication Sig Dispense Refill  . ALPRAZolam (XANAX) 0.5 MG tablet TAKE 1/2 TO 1 (ONE-HALF TO ONE) TABLET BY MOUTH ONCE DAILY AT BEDTIME AS NEEDED FOR ANXIETY OR SLEEP 30 tablet 0  . CHOLECALCIFEROL PO Take 5,000 Units  by mouth daily.    . metFORMIN (GLUCOPHAGE) 500 MG tablet Take 4 tabs daily with meals; spread over the course of the day, take 2 tabs with largest meal. 360 tablet 1  . nystatin cream (MYCOSTATIN) Apply 1 application topically 2 (two) times daily. 30 g 1  . promethazine (PHENERGAN) 25 MG tablet Take 1 tablet (25 mg total) by mouth every 6 (six) hours as needed for nausea or vomiting. 30 tablet 3  . fenofibrate (TRICOR) 145 MG tablet Take 1 tablet (145 mg total) by mouth daily. (Patient not taking: Reported on 12/10/2019) 90 tablet 1  . rosuvastatin (CRESTOR) 40 MG tablet Take 1 tablet (  40 mg total) by mouth daily. (Patient not taking: Reported on 12/10/2019) 90 tablet 1   No current facility-administered medications on file prior to visit.    Allergies  Allergen Reactions  . Codeine   . Glipizide Nausea And Vomiting  . Lunesta [Eszopiclone]     Excessive fatigue  . Welchol [Colesevelam Hcl]     nausea      Current Problems (verified) Patient Active Problem List   Diagnosis Date Noted  . Poor compliance 12/07/2019  . Aortic atherosclerosis ([place]) 08/01/2019  . CKD stage 2 due to type 2 diabetes mellitus ([place]) 07/20/2019  . Smoker 04/16/2019  . Depression, major, recurrent, in partial remission ([place]) 01/10/2019  . Arthralgia of both feet 10/04/2018  . Overweight (BMI 25.0-29.9) 09/29/2017  . Hyperlipidemia associated with type 2 diabetes mellitus ([place])   . Diabetes ([place])   . Colon cancer ([place])   . Vitamin D deficiency   . Anxiety   . Leukocytosis 09/18/2012    Screening Tests  There is no immunization history on file for this patient.  Preventative care: Last colonoscopy: 2010 Dr. Earlean Shawl, overdue, hx of colon cancer, patient DECLINES - would not pursue treatment even if diagnosed, states understands  Last mammogram: 05/2016, hx of clips in left per patient  Last pap smear/pelvic exam: 05/2013, hx of abnormal but had normal x 2 last checks, declines further DEXA: ordered today    Prior vaccinations: TD or Tdap: declines   Influenza: n/a Pneumococcal: TODAY  Prevnar13: declines Shingles/Zostavax: declines  Covid 19: declines   Names of Other Physician/Practitioners you currently use: 1. [facility] here for primary care 2. [facility], eye doctor, last visit 2021, report requested  3. Dr. ?, dentist, last visit 2019, declines follow up, has full dentures  Patient Care Team: Unk Pinto, MD as PCP - General (Internal Medicine) Wyatt Portela, MD as Consulting Physician (Oncology)  SURGICAL HISTORY She  has a past surgical history that includes Appendectomy. FAMILY HISTORY Her family history includes Cancer in her mother; Diabetes in her sister; Heart disease in her brother; Hyperlipidemia in her mother and sister; Hypertension in her mother. SOCIAL HISTORY She  reports that she has been smoking cigarettes. She started smoking about 43 years ago. She has a 33.00 pack-year smoking history. She has never used smokeless tobacco. She reports that she does not drink alcohol and does not use drugs.   MEDICARE WELLNESS OBJECTIVES: Physical activity: Current Exercise Habits: Home exercise routine, Type of exercise: walking, Time (Minutes): 10, Frequency (Times/Week): 3, Weekly Exercise (Minutes/Week): 30, Intensity: Mild, Exercise limited by: None identified Cardiac risk factors: Cardiac Risk Factors include: advanced age (>9men, >67 women);hypertension;dyslipidemia;diabetes mellitus;smoking/ tobacco exposure;sedentary lifestyle Depression/mood screen:   Depression screen [facility] 2/9 12/10/2019  Decreased Interest 1  Down, Depressed, Hopeless 1  PHQ - 2 Score 2  Altered sleeping 3  Tired, decreased energy 1  Change in appetite 0  Feeling bad or failure about yourself  -  Trouble concentrating -  Moving slowly or fidgety/restless -  Suicidal thoughts -  PHQ-9 Score 6    ADLs:  In your present state of health, do  you have any difficulty performing the following activities: 12/10/2019  Hearing? N  Vision? N  Difficulty concentrating or making decisions? N  Walking or climbing stairs? N  Dressing or bathing? N  Doing errands, shopping? N  Some recent data might be hidden     Cognitive Testing  Alert? Yes  Normal Appearance?Yes  Oriented to person? Yes  Place? Yes   Time? Yes  Recall of three objects?  Yes  Can perform simple calculations? Yes  Displays appropriate judgment?Yes  Can read the correct time from a watch face?Yes  EOL planning: Does Patient Have a Medical Advance Directive?: No Would patient like information on creating a medical advance directive?: Yes (MAU/Ambulatory/Procedural Areas - Information given)  Review of Systems  Constitutional: Negative for malaise/fatigue and weight loss.  HENT: Negative for hearing loss and tinnitus.   Eyes: Negative for blurred vision and double vision.  Respiratory: Negative for cough, sputum production, shortness of breath and wheezing.   Cardiovascular: Negative for chest pain, palpitations, orthopnea, claudication, leg swelling and PND.  Gastrointestinal: Negative for abdominal pain, blood in stool, constipation, diarrhea (frequently with medications), heartburn, melena, nausea and vomiting.  Genitourinary: Negative.   Musculoskeletal: Negative for falls (1 fall in 12 months ), joint pain (bil feet) and myalgias.  Skin: Negative for rash.       Left breast with draining abcess  Neurological: Positive for dizziness (intermittent with sudden position changes, with NECK EXTENSION, PATIENT DECLINES IMAGING/WORKUP). Negative for tingling, sensory change, weakness and headaches.  Endo/Heme/Allergies: Negative for polydipsia.  Psychiatric/Behavioral: Positive for depression. Negative for memory loss, substance abuse and suicidal ideas. The patient is not nervous/anxious and does not have insomnia.   All other systems reviewed and are  negative.    Objective:     Today's Vitals   12/10/19 1601  BP: 136/80  Pulse: 83  Temp: (!) 96.4 F (35.8 C)  SpO2: 97%  Weight: 158 lb (71.7 kg)   Body mass index is 27.99 kg/m.  General appearance: alert, no distress, WD/WN, female HEENT: normocephalic, sclerae anicteric, TMs pearly, nares patent, no discharge or erythema, pharynx normal Oral cavity: MMM, no lesions Neck: supple, no lymphadenopathy, no thyromegaly, no masses Heart: RRR, normal S1, S2, no murmurs Lungs: CTA bilaterally, no wheezes, rhonchi, or rales Abdomen: +bs, soft, non tender, non distended, no masses, no hepatomegaly, no splenomegaly Musculoskeletal: nontender, no swelling, no obvious deformity Extremities: no edema, no cyanosis, no clubbing Pulses: 2+ symmetric, upper and lower extremities, normal cap refill Neurological: alert, oriented x 3, CN2-12 intact, strength normal upper extremities and lower extremities, sensation normal throughout, DTRs 2+ throughout, no cerebellar signs, gait slow steady Psychiatric: normal affect, behavior normal, pleasant  Breasts: L with draining abscess medial around 10 o clock near sternum, small amount of thick purulent discharge with local tenderness, no heat or erythema, no rash, thickened texture of skin without fluctuance, no other palpable lump bilaterally, no axillary palpable lymph nodes, no rash, nipple discharge, peau d' orange, retractions. Soft texture throughout.   EKG: Sinus rhythm, possible LAFB AAA [place]: Negative   Medicare Attestation I have personally reviewed: The patient's medical and social history Their use of alcohol, tobacco or illicit drugs Their current medications and supplements The patient's functional ability including ADLs,fall risks, home safety risks, cognitive, and hearing and visual impairment Diet and physical activities Evidence for depression or mood disorders  The patient's weight, height, BMI, and visual acuity have been  recorded in the chart.  I have made referrals, counseling, and provided education to the patient based on review of the above and I have provided the patient with a written personalized care plan for preventive services.     Izora Ribas, NP   12/10/2019

## 2019-12-10 ENCOUNTER — Other Ambulatory Visit: Payer: Self-pay

## 2019-12-10 ENCOUNTER — Encounter: Payer: Self-pay | Admitting: Adult Health

## 2019-12-10 ENCOUNTER — Other Ambulatory Visit: Payer: Self-pay | Admitting: Internal Medicine

## 2019-12-10 ENCOUNTER — Ambulatory Visit (INDEPENDENT_AMBULATORY_CARE_PROVIDER_SITE_OTHER): Payer: Medicare Other | Admitting: Adult Health

## 2019-12-10 VITALS — BP 136/80 | HR 83 | Temp 96.4°F | Wt 158.0 lb

## 2019-12-10 DIAGNOSIS — C189 Malignant neoplasm of colon, unspecified: Secondary | ICD-10-CM

## 2019-12-10 DIAGNOSIS — Z91199 Patient's noncompliance with other medical treatment and regimen due to unspecified reason: Secondary | ICD-10-CM

## 2019-12-10 DIAGNOSIS — E1122 Type 2 diabetes mellitus with diabetic chronic kidney disease: Secondary | ICD-10-CM | POA: Diagnosis not present

## 2019-12-10 DIAGNOSIS — Z136 Encounter for screening for cardiovascular disorders: Secondary | ICD-10-CM

## 2019-12-10 DIAGNOSIS — N182 Chronic kidney disease, stage 2 (mild): Secondary | ICD-10-CM

## 2019-12-10 DIAGNOSIS — Z0000 Encounter for general adult medical examination without abnormal findings: Secondary | ICD-10-CM

## 2019-12-10 DIAGNOSIS — Z1231 Encounter for screening mammogram for malignant neoplasm of breast: Secondary | ICD-10-CM

## 2019-12-10 DIAGNOSIS — Z8249 Family history of ischemic heart disease and other diseases of the circulatory system: Secondary | ICD-10-CM | POA: Diagnosis not present

## 2019-12-10 DIAGNOSIS — Z0001 Encounter for general adult medical examination with abnormal findings: Secondary | ICD-10-CM | POA: Diagnosis not present

## 2019-12-10 DIAGNOSIS — E2839 Other primary ovarian failure: Secondary | ICD-10-CM

## 2019-12-10 DIAGNOSIS — F17200 Nicotine dependence, unspecified, uncomplicated: Secondary | ICD-10-CM

## 2019-12-10 DIAGNOSIS — F419 Anxiety disorder, unspecified: Secondary | ICD-10-CM

## 2019-12-10 DIAGNOSIS — R6889 Other general symptoms and signs: Secondary | ICD-10-CM

## 2019-12-10 DIAGNOSIS — I700 Atherosclerosis of aorta: Secondary | ICD-10-CM | POA: Diagnosis not present

## 2019-12-10 DIAGNOSIS — E559 Vitamin D deficiency, unspecified: Secondary | ICD-10-CM

## 2019-12-10 DIAGNOSIS — D72829 Elevated white blood cell count, unspecified: Secondary | ICD-10-CM

## 2019-12-10 DIAGNOSIS — Z1159 Encounter for screening for other viral diseases: Secondary | ICD-10-CM

## 2019-12-10 DIAGNOSIS — F3341 Major depressive disorder, recurrent, in partial remission: Secondary | ICD-10-CM

## 2019-12-10 DIAGNOSIS — N611 Abscess of the breast and nipple: Secondary | ICD-10-CM

## 2019-12-10 DIAGNOSIS — E1169 Type 2 diabetes mellitus with other specified complication: Secondary | ICD-10-CM | POA: Diagnosis not present

## 2019-12-10 DIAGNOSIS — R030 Elevated blood-pressure reading, without diagnosis of hypertension: Secondary | ICD-10-CM | POA: Diagnosis not present

## 2019-12-10 DIAGNOSIS — M25572 Pain in left ankle and joints of left foot: Secondary | ICD-10-CM

## 2019-12-10 DIAGNOSIS — E663 Overweight: Secondary | ICD-10-CM

## 2019-12-10 MED ORDER — ESCITALOPRAM OXALATE 20 MG PO TABS
20.0000 mg | ORAL_TABLET | Freq: Every day | ORAL | 1 refills | Status: DC
Start: 2019-12-10 — End: 2020-01-07

## 2019-12-10 NOTE — Patient Instructions (Signed)
      Please restart fenofibrate and rosuvastatin - start 1 at a time, and see if/when you have any problems with either med

## 2019-12-11 LAB — COMPLETE METABOLIC PANEL WITH GFR
AG Ratio: 1.4 (calc) (ref 1.0–2.5)
ALT: 12 U/L (ref 6–29)
AST: 11 U/L (ref 10–35)
Albumin: 4 g/dL (ref 3.6–5.1)
Alkaline phosphatase (APISO): 95 U/L (ref 37–153)
BUN: 12 mg/dL (ref 7–25)
CO2: 27 mmol/L (ref 20–32)
Calcium: 9.8 mg/dL (ref 8.6–10.4)
Chloride: 100 mmol/L (ref 98–110)
Creat: 0.7 mg/dL (ref 0.50–0.99)
GFR, Est African American: 105 mL/min/{1.73_m2} (ref >=60)
GFR, Est Non African American: 91 mL/min/{1.73_m2} (ref >=60)
Globulin: 2.8 g/dL (calc) (ref 1.9–3.7)
Glucose, Bld: 197 mg/dL — ABNORMAL HIGH (ref 65–99)
Potassium: 4.2 mmol/L (ref 3.5–5.3)
Sodium: 137 mmol/L (ref 135–146)
Total Bilirubin: 0.4 mg/dL (ref 0.2–1.2)
Total Protein: 6.8 g/dL (ref 6.1–8.1)

## 2019-12-11 LAB — CBC WITH DIFFERENTIAL/PLATELET
Absolute Monocytes: 727 cells/uL (ref 200–950)
Basophils Absolute: 138 cells/uL (ref 0–200)
Basophils Relative: 0.8 %
Eosinophils Absolute: 242 cells/uL (ref 15–500)
Eosinophils Relative: 1.4 %
HCT: 42.9 % (ref 35.0–45.0)
Hemoglobin: 13.1 g/dL (ref 11.7–15.5)
Lymphs Abs: 5034 cells/uL — ABNORMAL HIGH (ref 850–3900)
MCH: 19.2 pg — ABNORMAL LOW (ref 27.0–33.0)
MCHC: 30.5 g/dL — ABNORMAL LOW (ref 32.0–36.0)
MCV: 62.9 fL — ABNORMAL LOW (ref 80.0–100.0)
MPV: 10 fL (ref 7.5–12.5)
Monocytes Relative: 4.2 %
Neutro Abs: 11159 cells/uL — ABNORMAL HIGH (ref 1500–7800)
Neutrophils Relative %: 64.5 %
Platelets: 433 10*3/uL — ABNORMAL HIGH (ref 140–400)
RBC: 6.82 10*6/uL — ABNORMAL HIGH (ref 3.80–5.10)
RDW: 17.4 % — ABNORMAL HIGH (ref 11.0–15.0)
Total Lymphocyte: 29.1 %
WBC: 17.3 10*3/uL — ABNORMAL HIGH (ref 3.8–10.8)

## 2019-12-11 LAB — LIPID PANEL
Cholesterol: 221 mg/dL — ABNORMAL HIGH (ref <200)
HDL: 32 mg/dL — ABNORMAL LOW (ref >=50)
Non-HDL Cholesterol (Calc): 189 mg/dL (calc) — ABNORMAL HIGH (ref <130)
Total CHOL/HDL Ratio: 6.9 (calc) — ABNORMAL HIGH (ref <5.0)
Triglycerides: 559 mg/dL — ABNORMAL HIGH (ref <150)

## 2019-12-11 LAB — HEPATITIS C ANTIBODY
Hepatitis C Ab: NONREACTIVE
SIGNAL TO CUT-OFF: 0.01 (ref <1.00)

## 2019-12-11 LAB — MAGNESIUM: Magnesium: 2 mg/dL (ref 1.5–2.5)

## 2019-12-11 LAB — TSH: TSH: 2.94 mIU/L (ref 0.40–4.50)

## 2019-12-11 LAB — HEMOGLOBIN A1C
Hgb A1c MFr Bld: 9.8 % of total Hgb — ABNORMAL HIGH (ref <5.7)
Mean Plasma Glucose: 235 (calc)
eAG (mmol/L): 13 (calc)

## 2019-12-24 ENCOUNTER — Other Ambulatory Visit: Payer: Self-pay | Admitting: Adult Health

## 2019-12-24 DIAGNOSIS — N611 Abscess of the breast and nipple: Secondary | ICD-10-CM

## 2019-12-25 ENCOUNTER — Ambulatory Visit: Payer: Self-pay

## 2019-12-26 ENCOUNTER — Other Ambulatory Visit: Payer: Self-pay | Admitting: Adult Health

## 2019-12-26 ENCOUNTER — Ambulatory Visit
Admission: RE | Admit: 2019-12-26 | Discharge: 2019-12-26 | Disposition: A | Discharge status: Home / Self Care | Payer: Medicare Other | Source: Ambulatory Visit | Attending: Adult Health | Admitting: Adult Health

## 2019-12-26 DIAGNOSIS — N611 Abscess of the breast and nipple: Secondary | ICD-10-CM

## 2019-12-26 DIAGNOSIS — G4700 Insomnia, unspecified: Secondary | ICD-10-CM

## 2019-12-27 ENCOUNTER — Other Ambulatory Visit: Payer: Self-pay | Admitting: Adult Health

## 2019-12-27 DIAGNOSIS — N611 Abscess of the breast and nipple: Secondary | ICD-10-CM | POA: Insufficient documentation

## 2019-12-27 MED ORDER — GLIMEPIRIDE 2 MG PO TABS
2.0000 mg | ORAL_TABLET | Freq: Every day | ORAL | 0 refills | Status: DC
Start: 2019-12-27 — End: 2019-12-28

## 2019-12-27 MED ORDER — SULFAMETHOXAZOLE-TRIMETHOPRIM 800-160 MG PO TABS: 1.0000 | ORAL_TABLET | Freq: Two times a day (BID) | ORAL | 0 refills | Status: DC

## 2019-12-28 ENCOUNTER — Other Ambulatory Visit: Payer: Self-pay | Admitting: Adult Health

## 2019-12-28 MED ORDER — GLIMEPIRIDE 4 MG PO TABS
4.0000 mg | ORAL_TABLET | Freq: Every day | ORAL | 1 refills | Status: DC
Start: 2019-12-28 — End: 2020-03-14

## 2020-01-04 ENCOUNTER — Ambulatory Visit
Admission: RE | Admit: 2020-01-04 | Discharge: 2020-01-04 | Disposition: A | Discharge status: Home / Self Care | Payer: Medicare Other | Source: Ambulatory Visit | Attending: Adult Health | Admitting: Adult Health

## 2020-01-04 DIAGNOSIS — F17200 Nicotine dependence, unspecified, uncomplicated: Secondary | ICD-10-CM

## 2020-01-04 NOTE — Progress Notes (Signed)
Assessment and Plan:  Dorothy Boyd was seen today for follow-up.  Diagnoses and all orders for this visit:  Left breast abscess Appears resolved spontaneously;  Continue monitoring closely, ok to stop dressings Follow up if any recurrence, redness, tenderness, heat or recurrent open wound  Type 2 diabetes mellitus with stage 1 chronic kidney disease, with long-term current use of insulin ([name]) She reports is tolerating current medications;  Education: Reviewed 'ABCs' of diabetes management (respective goals in parentheses):  A1C (<7), blood pressure (<130/80), and cholesterol (LDL <70) Eye Exam yearly and Dental Exam every 6 months. Dietary recommendations Physical Activity recommendations Hypoglycemia management reviewed -     Fructosamine  Dilatation of pulmonic artery ([name]) Per recent CT, possible PAH; check ECHO -     ECHOCARDIOGRAM COMPLETE; Future  Need for pneumococcal vaccine -     Pneumococcal polysaccharide vaccine 23-valent greater than or equal to 2yo subcutaneous/IM  Other orders -     rosuvastatin (CRESTOR) 40 MG tablet; Take 1 tablet (40 mg total) by mouth daily. -     fenofibrate (TRICOR) 145 MG tablet; Take 1 tablet (145 mg total) by mouth daily.  Further disposition pending results of labs. Discussed med's effects and SE's.   Over 30 minutes of exam, counseling, chart review, and critical decision making was performed.   Future Appointments  Date Time Provider [place]  03/05/2020  3:00 PM [facility] DX DEXA 1 [facility] GI-BREAST CE  03/11/2020  3:30 PM Lonn Im, [name], NP [facility] None    ------------------------------------------------------------------------------------------------------------------   HPI BP 108/60   Pulse 77   Temp (!) 97.5 F (36.4 C)   Wt 157 lb (71.2 kg)   SpO2 99%   BMI 27.81 kg/m   64 y.o.female presents for diabetes follow up and persistent non-healing wound/abscess of left breast.   She was found to have abscess of  chest, [name], [place], did I&D on 6/30 with malodorus pus, culture showed G+ cocci in clusters however without significant growth, no abx sensitivity, was prescribed doxycycline 100 mg BID x 10 days which the patient states she completed, didn't perceive improvement with this, still has ongoing purulent drainage. She underwent diagnostic mammogram and [place] which did not show abscess, was recommended derm vs surgery referral for possible infected sebaceous cyst vs complex wound requiring drainage. She was initiated on bactrim but stopped citing nausea and has been monitoring with dressing changes. She reports as of today no longer seeing any discharge.   She is a smoker; 30+ pack year; recently had low dose CT lung cancer screening, negative for cancer but did show dilated pulmonary artery ~3.2 cm, with evidence of small airway disease. She denies CP, dyspnea, wheezing, coughing, fatigue, snoring.    She is on cholesterol medication (fenofibrate 145 mg, taking daily since last visit, is also prescribed rosuvastatin but out and not taking) and denies myalgias. Her cholesterol is not at goal. The cholesterol last visit was:   Lab Results  Component Value Date   CHOL 221 (H) 12/10/2019   HDL 32 (L) 12/10/2019   [name]  12/10/2019     Comment:     . LDL cholesterol not calculated. Triglyceride levels greater than 400 mg/dL invalidate calculated LDL results. . Reference range: <100 . Desirable range <100 mg/dL for primary prevention;   <70 mg/dL for patients with CHD or diabetic patients  with > or = 2 CHD risk factors. Marland Kitchen LDL-C is now calculated using the [name]  calculation, which is a validated novel method providing  better accuracy than the Friedewald equation in the  estimation of LDL-C.  [name] et al. [name]. 9509;326(71): 2061-2068  (http://education.[employer].com/faq/FAQ164)    TRIG 559 (H) 12/10/2019   CHOLHDL 6.9 (H) 12/10/2019    She has been working on diet and  exercise for T2 diabetes, numerous med intolerances and limited options due to insurance, patient absolutely declines to check glucose at home or consider insulin, did restart glimepiride 4 mg in AM and denies increased appetite, nausea, paresthesia of the feet, polydipsia, polyuria, visual disturbances and vomiting. Last A1C in the office was:  Lab Results  Component Value Date   HGBA1C 9.8 (H) 12/10/2019   BMI is Body mass index is 27.81 kg/m. Wt Readings from Last 3 Encounters:  01/07/20 157 lb (71.2 kg)  12/10/19 158 lb (71.7 kg)  11/28/19 155 lb (70.3 kg)     Past Medical History:  Diagnosis Date  . Anxiety   . Cervical dysplasia   . Colon cancer ([employer])   . Depression   . Diabetes ([place])   . History of colon cancer, stage II 09/18/2012  . Hyperlipidemia   . Right-sided Bell's palsy 01/03/2019  . Vitamin D deficiency      Allergies  Allergen Reactions  . Codeine   . Glipizide Nausea And Vomiting  . Lunesta [Eszopiclone]     Excessive fatigue  . Welchol [Colesevelam Hcl]     nausea     Current Outpatient Medications on File Prior to Visit  Medication Sig  . ALPRAZolam (XANAX) 0.5 MG tablet TAKE 1/2 TO 1 (ONE-HALF TO ONE) TABLET BY MOUTH ONCE DAILY AT BEDTIME AS NEEDED FOR ANXIETY OR  SLEEP  . CHOLECALCIFEROL PO Take 5,000 Units by mouth daily.  Marland Kitchen glimepiride (AMARYL) 4 MG tablet Take 1 tablet (4 mg total) by mouth daily with breakfast.  . metFORMIN (GLUCOPHAGE) 500 MG tablet Take 4 tabs daily with meals; spread over the course of the day, take 2 tabs with largest meal.  . nystatin cream (MYCOSTATIN) Apply 1 application topically 2 (two) times daily.  . promethazine (PHENERGAN) 25 MG tablet Take 1 tablet (25 mg total) by mouth every 6 (six) hours as needed for nausea or vomiting.  Marland Kitchen QUEtiapine (SEROQUEL) 25 MG tablet Take 1 tablet at Bedtime for Sleep   No current facility-administered medications on file prior to visit.    ROS: all negative except above.   Physical  Exam:  BP 108/60   Pulse 77   Temp (!) 97.5 F (36.4 C)   Wt 157 lb (71.2 kg)   SpO2 99%   BMI 27.81 kg/m   General Appearance: Well nourished, in no apparent distress. Eyes: PERRLA, EOMs, conjunctiva no swelling or erythema Sinuses: No Frontal/maxillary tenderness ENT/Mouth: Ext aud canals clear, TMs without erythema, bulging. No erythema, swelling, or exudate on post pharynx.  Tonsils not swollen or erythematous. Hearing normal.  Neck: Supple, thyroid normal.  Respiratory: Respiratory effort normal, BS equal bilaterally without rales, rhonchi, wheezing or stridor.  Cardio: RRR with no MRGs. Brisk peripheral pulses without edema.  Abdomen: Soft, + BS.  Non tender, no guarding, rebound, hernias, masses. Lymphatics: Non tender without lymphadenopathy.  Musculoskeletal: Full ROM, 5/5 strength, normal gait.  Skin: Warm, dry; previous open wound to left breast is closed without notable lump; no discharge; mild local pink discoloration without evidence of active infection Neuro: Cranial nerves intact. Normal muscle tone, no cerebellar symptoms. Sensation intact.  Psych: Awake and oriented X 3, normal affect, Insight and Judgment appropriate.  Izora Ribas, NP 5:31 PM [facility]

## 2020-01-07 ENCOUNTER — Other Ambulatory Visit: Payer: Self-pay

## 2020-01-07 ENCOUNTER — Ambulatory Visit (INDEPENDENT_AMBULATORY_CARE_PROVIDER_SITE_OTHER): Payer: Medicare Other | Admitting: Adult Health

## 2020-01-07 ENCOUNTER — Encounter: Payer: Self-pay | Admitting: Adult Health

## 2020-01-07 VITALS — BP 108/60 | HR 77 | Temp 97.5°F | Wt 157.0 lb

## 2020-01-07 DIAGNOSIS — N611 Abscess of the breast and nipple: Secondary | ICD-10-CM

## 2020-01-07 DIAGNOSIS — I2721 Secondary pulmonary arterial hypertension: Secondary | ICD-10-CM | POA: Diagnosis not present

## 2020-01-07 DIAGNOSIS — Z23 Encounter for immunization: Secondary | ICD-10-CM

## 2020-01-07 DIAGNOSIS — E1122 Type 2 diabetes mellitus with diabetic chronic kidney disease: Secondary | ICD-10-CM | POA: Diagnosis not present

## 2020-01-07 DIAGNOSIS — N181 Chronic kidney disease, stage 1: Secondary | ICD-10-CM

## 2020-01-07 DIAGNOSIS — I288 Other diseases of pulmonary vessels: Secondary | ICD-10-CM

## 2020-01-07 DIAGNOSIS — Z794 Long term (current) use of insulin: Secondary | ICD-10-CM

## 2020-01-07 MED ORDER — FENOFIBRATE 145 MG PO TABS: 160.0000 mg | ORAL_TABLET | Freq: Every day | ORAL | 1 refills | Status: DC

## 2020-01-07 MED ORDER — ROSUVASTATIN CALCIUM 40 MG PO TABS: 40.0000 mg | ORAL_TABLET | Freq: Every day | ORAL | 1 refills | Status: DC

## 2020-01-07 NOTE — Patient Instructions (Signed)
Continue fenofibrate daily for now  Start rosuvastatin 1 tab once a week for 2-3 weeks; if doing ok, increase to twice a week and continue    High Triglycerides Eating Plan Triglycerides are a type of fat in the blood. High levels of triglycerides can increase your risk of heart disease and stroke. If your triglyceride levels are high, choosing the right foods can help lower your triglycerides and keep your heart healthy. Work with your health care provider or a diet and nutrition specialist (dietitian) to develop an eating plan that is right for you. What are tips for following this plan? General guidelines   Lose weight, if you are overweight. For most people, losing 5-10 lbs (2-5 kg) helps lower triglyceride levels. A weight-loss plan may include. ? 30 minutes of exercise at least 5 days a week. ? Reducing the amount of calories, sugar, and fat you eat.  Eat a wide variety of fresh fruits, vegetables, and whole grains. These foods are high in fiber.  Eat foods that contain healthy fats, such as fatty fish, nuts, seeds, and olive oil.  Avoid foods that are high in added sugar, added salt (sodium), saturated fat, and trans fat.  Avoid low-fiber, refined carbohydrates such as white bread, crackers, noodles, and white rice.  Avoid foods with partially hydrogenated oils (trans fats), such as fried foods or stick margarine.  Limit alcohol intake to no more than 1 drink a day for nonpregnant women and 2 drinks a day for men. One drink equals 12 oz of beer, 5 oz of wine, or 1 oz of hard liquor. Your health care provider may recommend that you drink less depending on your overall health. Reading food labels  Check food labels for the amount of saturated fat. Choose foods with no or very little saturated fat.  Check food labels for the amount of trans fat. Choose foods with no trans fat.  Check food labels for the amount of cholesterol. Choose foods low in cholesterol. Ask your  dietitian how much cholesterol you should have each day.  Check food labels for the amount of sodium. Choose foods with less than 140 milligrams (mg) per serving. Shopping  Buy dairy products labeled as nonfat (skim) or low-fat (1%).  Avoid buying processed or prepackaged foods. These are often high in added sugar, sodium, and fat. Cooking  Choose healthy fats when cooking, such as olive oil or canola oil.  Cook foods using lower fat methods, such as baking, broiling, boiling, or grilling.  Make your own sauces, dressings, and marinades when possible, instead of buying them. Store-bought sauces, dressings, and marinades are often high in sodium and sugar. Meal planning  Eat more home-cooked food and less restaurant, buffet, and fast food.  Eat fatty fish at least 2 times each week. Examples of fatty fish include salmon, trout, mackerel, tuna, and herring.  If you eat whole eggs, do not eat more than 3 egg yolks per week. What foods are recommended? The items listed may not be a complete list. Talk with your dietitian about what dietary choices are best for you. Grains Whole wheat or whole grain breads, crackers, cereals, and pasta. Unsweetened oatmeal. Bulgur. Barley. Quinoa. Brown rice. Whole wheat flour tortillas. Vegetables Fresh or frozen vegetables. Low-sodium canned vegetables. Fruits All fresh, canned (in natural juice), or frozen fruits. Meats and other protein foods Skinless chicken or [place]. Ground chicken or [place]. Lean cuts of pork, trimmed of fat. Fish and seafood, especially salmon, trout,  and herring. Egg whites. Dried beans, peas, or lentils. Unsalted nuts or seeds. Unsalted canned beans. Natural peanut or almond butter. Dairy Low-fat dairy products. Skim or low-fat (1%) milk. Reduced fat (2%) and low-sodium cheese. Low-fat ricotta cheese. Low-fat cottage cheese. Plain, low-fat yogurt. Fats and oils Tub margarine without trans fats. Light or reduced-fat  mayonnaise. Light or reduced-fat salad dressings. Avocado. Safflower, olive, sunflower, soybean, and canola oils. What foods are not recommended? The items listed may not be a complete list. Talk with your dietitian about what dietary choices are best for you. Grains White bread. White (regular) pasta. White rice. Cornbread. Bagels. Pastries. Crackers that contain trans fat. Vegetables Creamed or fried vegetables. Vegetables in a cheese sauce. Fruits Sweetened dried fruit. Canned fruit in syrup. Fruit juice. Meats and other protein foods Fatty cuts of meat. Ribs. Chicken wings. Berniece Salines. Sausage. Bologna. Salami. Chitterlings. Fatback. Hot dogs. Bratwurst. Packaged lunch meats. Dairy Whole or reduced-fat (2%) milk. Half-and-half. Cream cheese. Full-fat or sweetened yogurt. Full-fat cheese. Nondairy creamers. Whipped toppings. Processed cheese or cheese spreads. Cheese curds. Beverages Alcohol. Sweetened drinks, such as soda, lemonade, fruit drinks, or punches. Fats and oils Butter. Stick margarine. Lard. Shortening. Ghee. Bacon fat. Tropical oils, such as coconut, palm kernel, or palm oils. Sweets and desserts Corn syrup. Sugars. Honey. Molasses. Candy. Jam and jelly. Syrup. Sweetened cereals. Cookies. Pies. Cakes. Donuts. Muffins. Ice cream. Condiments Store-bought sauces, dressings, and marinades that are high in sugar, such as ketchup and barbecue sauce. Summary  High levels of triglycerides can increase the risk of heart disease and stroke. Choosing the right foods can help lower your triglycerides.  Eat plenty of fresh fruits, vegetables, and whole grains. Choose low-fat dairy and lean meats. Eat fatty fish at least twice a week.  Avoid processed and prepackaged foods with added sugar, sodium, saturated fat, and trans fat.  If you need suggestions or have questions about what types of food are good for you, talk with your health care provider or a dietitian. This information is not  intended to replace advice given to you by your health care provider. Make sure you discuss any questions you have with your health care provider. Document Revised: 04/29/2017 Document Reviewed: 07/20/2016 Elsevier Patient Education  2020 [employer].

## 2020-01-10 LAB — FRUCTOSAMINE: Fructosamine: 307 umol/L — ABNORMAL HIGH (ref 205–285)

## 2020-01-17 ENCOUNTER — Encounter: Payer: Self-pay | Admitting: *Deleted

## 2020-01-28 ENCOUNTER — Other Ambulatory Visit: Payer: Self-pay | Admitting: Adult Health

## 2020-01-28 DIAGNOSIS — F3341 Major depressive disorder, recurrent, in partial remission: Secondary | ICD-10-CM

## 2020-02-20 ENCOUNTER — Other Ambulatory Visit (HOSPITAL_COMMUNITY): Payer: 59

## 2020-03-05 ENCOUNTER — Other Ambulatory Visit: Payer: Self-pay

## 2020-03-06 ENCOUNTER — Other Ambulatory Visit: Payer: Self-pay

## 2020-03-06 ENCOUNTER — Ambulatory Visit (HOSPITAL_COMMUNITY): Payer: Medicare Other | Attending: Internal Medicine

## 2020-03-06 DIAGNOSIS — I288 Other diseases of pulmonary vessels: Secondary | ICD-10-CM | POA: Diagnosis not present

## 2020-03-06 DIAGNOSIS — I2721 Secondary pulmonary arterial hypertension: Secondary | ICD-10-CM | POA: Diagnosis present

## 2020-03-06 LAB — ECHOCARDIOGRAM COMPLETE
Area-P 1/2: 2.83 cm2
S' Lateral: 3 cm

## 2020-03-07 ENCOUNTER — Encounter: Payer: Self-pay | Admitting: Adult Health

## 2020-03-07 DIAGNOSIS — I3481 Nonrheumatic mitral (valve) annulus calcification: Secondary | ICD-10-CM | POA: Insufficient documentation

## 2020-03-07 DIAGNOSIS — I059 Rheumatic mitral valve disease, unspecified: Secondary | ICD-10-CM | POA: Insufficient documentation

## 2020-03-07 NOTE — Progress Notes (Deleted)
FOLLOW UP  Assessment and Plan:   Atherosclerosis of aorta Per CT 2019 Control blood pressure, cholesterol, glucose, increase exercise.   Hypertension Well controlled without medications Monitor blood pressure at home; patient to call if consistently greater than 130/80 Continue DASH diet.   Reminder to go to the ER if any CP, SOB, nausea, dizziness, severe HA, changes vision/speech, left arm numbness and tingling and jaw pain.  Cholesterol Currently above goal; restart rosuvastatin, continue fenofibrate, nexlizet samples given  LDL goal <70 due to numerous risk factors She understands risk of MI/Stroke with poorly controlled diabetes, cholesterol and smoking Continue low cholesterol diet and exercise.  Check lipid panel.   Diabetes with other diabetic neurologic complication Overall poorly compliant and very poor control Scared of needles and refuses to check glucose Continue medication: taking glimepiride 4 mg daily and tolerating well, discussed resume metformin 500 mg 1-2 tabs daily- check with pharmacy about resuming XR  Does well with farxiga; given samples and will attempt to reorder, coupon given as now with commercial insurance Continue diet and exercise.  Perform daily foot/skin check, notify office of any concerning changes.  Check A1C  BMI 28 Long discussion about weight loss, diet, and exercise Recommended diet heavy in fruits and veggies and low in animal meats, cheeses, and dairy products, appropriate calorie intake Discussed ideal weight for height  Will follow up in 3 months  Vitamin D Def Taking 5000 IU daily  Check vitamin D at next OV once she has medicare  Tobacco use Discussed risks associated with tobacco use and advised to reduce or quit Patient is not ready to do so, but advised to consider strongly Continue annual CT screening   Leukocytosis Hass seen [name] recently, recommended continued follow up CBC today. She will follow up  ***  Depression in partial remission/ Insomnia Continue celexa 40 mg daily which has been helpful Limited benefit with seroquel though has improved mood and wants to continue; didn't do well with trazodone or gabapentin She reports does well with 0.25 mg PRN xanax; emphasized need to limit to <5 days/week, she feels this is reasonable, will monitor frequency/PDMP closely, no dose increases Insomnia- good sleep hygiene discussed, increase day time activity  Abdominal pain/constipation Generalied lower/R sided abdominal tenderness with constipation Had CT abdomen without evidence of diverticuloris UTI was treated without change in sx; yeast was mentioned but not treated - diflucan sent in  Syspect possible element of MSK due to paraspinal tenderness Abdominal pain I suspect is r/t constipation; discussed bowel regimen; try mag citrate - 1/2 bottle followed by 2 glasses of water, repeat in 4 hours if needed;  For maintenance increase water, esp with miralax, add sennakot, suspect element of gastroparesis r/t diabetes and colon hx Follow up if sx aren't improving  Continue diet and meds as discussed. Further disposition pending results of labs. Discussed med's effects and SE's.   Over 30 minutes of exam, counseling, chart review, and critical decision making was performed.   Future Appointments  Date Time Provider [place]  03/11/2020  3:30 PM Liane Comber, NP [facility] None    ----------------------------------------------------------------------------------------------------------------------  HPI 64 y.o. female, cash pay, presents for 3 month follow up on hypertension, cholesterol, diabetes, weight and vitamin D deficiency. She is self pay but will be signing up for medicare now than she is 63.   She has hx of colon cancer in 2010, s/p resection and established with [name]; she has had unexplained leukocytosis and microcytosis documented since 2010.  Etiology remains  unclear with possible myeloproliferative disorder and was referred back to Oncology [name] in 10/2017 for evaluation, apparently hx of negative biopsy, neg smear and JAK2 and recommended monitoring due to 15 year interval stability unless further WBC changes. ***  she has a diagnosis of depression and is currently on celexa 40 mg daily, reports symptoms are well controlled on current regimen.  she additionally has insomnia, taking seroquel 25 mg at night but while helps with mood hasn't improved sleep. After failing gabapentin and trazodone, she has been on xanax, takes 0.25 mg as needed for sleep. ***  She continues to smoke, has cut back to <1 pack a day. She had benign CT chest 01/04/2020.   BMI is There is no height or weight on file to calculate BMI., she has been working on diet, she is walking daily, 15 min daily. She is still drinking a some diet soda daily, drinks 2 cups of water daily. She admits eating only 1 meal a day, typically has sandwich with multigrain bread, crackers, etc, and will eat fruit.  Very poorly motivated to work on lifestyle. Wt Readings from Last 3 Encounters:  01/07/20 157 lb (71.2 kg)  12/10/19 158 lb (71.7 kg)  11/28/19 155 lb (70.3 kg)   She has aortic atherosclerosis per CT 2019. Had recent ECHO 03/07/2020 due to ? pulmonary artery dilation on CT, showed normal PA pressures, grade 1 diastolic dysfunction (relaxation) with EF 60-65%, mod/severe mitral valve annual calcification without regurgitation.   Her blood pressure has been controlled at home, today their BP is    She does not workout. She denies chest pain, shortness of breath, dizziness.   She is on cholesterol medication (rosuvastatin 40 mg but doesn't take frequently as causes GI discomfort - all meds do, only taking once a week or so, has been taking fenofibrate 145 mg daily as does fairly with this) and denies myalgias. Her cholesterol is not at goal. The cholesterol last visit was:   Lab Results   Component Value Date   CHOL 221 (H) 12/10/2019   HDL 32 (L) 12/10/2019   [place]  12/10/2019     Comment:     . LDL cholesterol not calculated. Triglyceride levels greater than 400 mg/dL invalidate calculated LDL results. . Reference range: <100 . Desirable range <100 mg/dL for primary prevention;   <70 mg/dL for patients with CHD or diabetic patients  with > or = 2 CHD risk factors. Marland Kitchen LDL-C is now calculated using the [name]  calculation, which is a validated novel method providing  better accuracy than the Friedewald equation in the  estimation of LDL-C.  [name] et al. [name]. 7124;580(99): 2061-2068  (http://education.[facility].com/faq/FAQ164)    TRIG 559 (H) 12/10/2019   CHOLHDL 6.9 (H) 12/10/2019    She has been working on diet and diabetes with CKD, and denies foot ulcerations, increased appetite, nausea, polydipsia, polyuria, vomiting and weight loss. She does not currently take sugars at home, adamantly refuses to do so. She has reported intolerance to multiple oral agents and has been poorly compliant with therapy, and is poorly compliant with dietary recommendations as well. Consequently A1Cs have been very poorly controlled.  She has not been taking metformin since last visit, reports did tolerate 500 mg BID, had diarrhea with 3 tabs, has tolerated glimepiride 4 mg daily and has been taking this daily.  Tried samples of onglyza, trajenta, jardiance, farxiga (liked farxiga but $400 with insurance).  Last A1C in the office was:  Lab Results  Component Value Date   HGBA1C 9.8 (H) 12/10/2019    CKD 1/2 monitored at this office:  Lab Results  Component Value Date   [facility] 91 12/10/2019   Patient is on Vitamin D supplement, Taking 5000 IU daily when she remembers.  Lab Results  Component Value Date   VD25OH 10 (L) 10/04/2018       Current Medications:  Current Outpatient Medications on File Prior to Visit  Medication Sig   ALPRAZolam (XANAX)  0.5 MG tablet TAKE 1/2 TO 1 (ONE-HALF TO ONE) TABLET BY MOUTH ONCE DAILY AT BEDTIME AS NEEDED FOR ANXIETY OR  SLEEP   CHOLECALCIFEROL PO Take 5,000 Units by mouth daily.   citalopram (CELEXA) 40 MG tablet Take 1 tablet by mouth once daily   fenofibrate (TRICOR) 145 MG tablet Take 1 tablet (145 mg total) by mouth daily.   glimepiride (AMARYL) 4 MG tablet Take 1 tablet (4 mg total) by mouth daily with breakfast.   metFORMIN (GLUCOPHAGE) 500 MG tablet Take 4 tabs daily with meals; spread over the course of the day, take 2 tabs with largest meal.   nystatin cream (MYCOSTATIN) Apply 1 application topically 2 (two) times daily.   promethazine (PHENERGAN) 25 MG tablet Take 1 tablet (25 mg total) by mouth every 6 (six) hours as needed for nausea or vomiting.   QUEtiapine (SEROQUEL) 25 MG tablet Take 1 tablet at Bedtime for Sleep   rosuvastatin (CRESTOR) 40 MG tablet Take 1 tablet (40 mg total) by mouth daily.   No current facility-administered medications on file prior to visit.     Allergies:  Allergies  Allergen Reactions   Codeine    Glipizide Nausea And Vomiting   Lunesta [Eszopiclone]     Excessive fatigue   Welchol [Colesevelam Hcl]     nausea      Medical History:  Past Medical History:  Diagnosis Date   Anxiety    Cervical dysplasia    Colon cancer ([name])    Depression    Diabetes ([place])    History of colon cancer, stage II 09/18/2012   Hyperlipidemia    Right-sided Bell's palsy 01/03/2019   Vitamin D deficiency    Family history- Reviewed and unchanged Social history- Reviewed and unchanged   Review of Systems: *** Review of Systems  Constitutional: Negative for malaise/fatigue and weight loss.  HENT: Negative for hearing loss and tinnitus.   Eyes: Negative for blurred vision and double vision.  Respiratory: Negative for cough, shortness of breath and wheezing.   Cardiovascular: Negative for chest pain, palpitations, orthopnea, claudication and leg  swelling.  Gastrointestinal: Positive for abdominal pain and constipation. Negative for blood in stool, diarrhea, heartburn, melena, nausea and vomiting.  Genitourinary: Negative.   Musculoskeletal: Negative for joint pain (feet improved with tumeric) and myalgias.  Skin: Negative for rash.  Neurological: Negative for dizziness, tingling, sensory change, weakness and headaches.  Endo/Heme/Allergies: Negative for polydipsia.  Psychiatric/Behavioral: Negative for depression, substance abuse and suicidal ideas. The patient has insomnia. The patient is not nervous/anxious.   All other systems reviewed and are negative.   Physical Exam: There were no vitals taken for this visit. Wt Readings from Last 3 Encounters:  01/07/20 157 lb (71.2 kg)  12/10/19 158 lb (71.7 kg)  11/28/19 155 lb (70.3 kg)   General Appearance: Well nourished, in no apparent distress. Eyes: PERRLA, EOMs, conjunctiva no swelling or erythema Sinuses: No Frontal/maxillary tenderness ENT/Mouth: Ext aud canals clear, TMs without erythema, bulging. No erythema,  swelling, or exudate on post pharynx.  Tonsils not swollen or erythematous. Hearing normal.  Neck: Supple, thyroid normal.  Respiratory: Respiratory effort normal, BS equal bilaterally without rales, rhonchi, wheezing or stridor.  Cardio: RRR with no MRGs. Brisk peripheral pulses without edema.  Abdomen: Soft, + BS.  Generalized superficial tenderness of R and lower abdomen, non-localized/specifit, no guarding, rebound, hernias, palpable masses. No CVA tenderness.  Lymphatics: Non tender without lymphadenopathy.  Musculoskeletal: Full ROM, 5/5 strength, Normal gait. No spinous tenderness, has paraspinal tenderness without spasm.  Skin: Warm, dry without rashes, lesions, ecchymosis.  Neuro: Cranial nerves intact. No cerebellar symptoms.  Psych: Awake and oriented X 3, depressed/sad affect, Insight and Judgment appropriate.    Dorothy Ribas, NP 2:25 PM [facility]  Adult & Adolescent Internal Medicine

## 2020-03-11 ENCOUNTER — Ambulatory Visit: Payer: Medicare Other | Admitting: Adult Health

## 2020-03-11 DIAGNOSIS — D72829 Elevated white blood cell count, unspecified: Secondary | ICD-10-CM

## 2020-03-11 DIAGNOSIS — E1169 Type 2 diabetes mellitus with other specified complication: Secondary | ICD-10-CM

## 2020-03-11 DIAGNOSIS — Z79899 Other long term (current) drug therapy: Secondary | ICD-10-CM

## 2020-03-11 DIAGNOSIS — E559 Vitamin D deficiency, unspecified: Secondary | ICD-10-CM

## 2020-03-11 DIAGNOSIS — E1122 Type 2 diabetes mellitus with diabetic chronic kidney disease: Secondary | ICD-10-CM

## 2020-03-11 DIAGNOSIS — I700 Atherosclerosis of aorta: Secondary | ICD-10-CM

## 2020-03-11 DIAGNOSIS — F17200 Nicotine dependence, unspecified, uncomplicated: Secondary | ICD-10-CM

## 2020-03-11 DIAGNOSIS — F419 Anxiety disorder, unspecified: Secondary | ICD-10-CM

## 2020-03-11 DIAGNOSIS — E663 Overweight: Secondary | ICD-10-CM

## 2020-03-11 DIAGNOSIS — I059 Rheumatic mitral valve disease, unspecified: Secondary | ICD-10-CM

## 2020-03-11 DIAGNOSIS — F3341 Major depressive disorder, recurrent, in partial remission: Secondary | ICD-10-CM

## 2020-03-13 ENCOUNTER — Ambulatory Visit (INDEPENDENT_AMBULATORY_CARE_PROVIDER_SITE_OTHER): Payer: Medicare Other | Admitting: Adult Health

## 2020-03-13 ENCOUNTER — Other Ambulatory Visit: Payer: Self-pay

## 2020-03-13 ENCOUNTER — Encounter: Payer: Self-pay | Admitting: Adult Health

## 2020-03-13 VITALS — BP 122/66 | HR 53 | Temp 96.8°F | Wt 161.0 lb

## 2020-03-13 DIAGNOSIS — F17200 Nicotine dependence, unspecified, uncomplicated: Secondary | ICD-10-CM

## 2020-03-13 DIAGNOSIS — E1169 Type 2 diabetes mellitus with other specified complication: Secondary | ICD-10-CM | POA: Diagnosis not present

## 2020-03-13 DIAGNOSIS — E663 Overweight: Secondary | ICD-10-CM

## 2020-03-13 DIAGNOSIS — I3481 Nonrheumatic mitral (valve) annulus calcification: Secondary | ICD-10-CM

## 2020-03-13 DIAGNOSIS — C189 Malignant neoplasm of colon, unspecified: Secondary | ICD-10-CM | POA: Diagnosis not present

## 2020-03-13 DIAGNOSIS — F419 Anxiety disorder, unspecified: Secondary | ICD-10-CM

## 2020-03-13 DIAGNOSIS — N182 Chronic kidney disease, stage 2 (mild): Secondary | ICD-10-CM

## 2020-03-13 DIAGNOSIS — I700 Atherosclerosis of aorta: Secondary | ICD-10-CM

## 2020-03-13 DIAGNOSIS — E538 Deficiency of other specified B group vitamins: Secondary | ICD-10-CM

## 2020-03-13 DIAGNOSIS — E785 Hyperlipidemia, unspecified: Secondary | ICD-10-CM

## 2020-03-13 DIAGNOSIS — F3341 Major depressive disorder, recurrent, in partial remission: Secondary | ICD-10-CM

## 2020-03-13 DIAGNOSIS — I059 Rheumatic mitral valve disease, unspecified: Secondary | ICD-10-CM

## 2020-03-13 DIAGNOSIS — D72829 Elevated white blood cell count, unspecified: Secondary | ICD-10-CM

## 2020-03-13 DIAGNOSIS — E559 Vitamin D deficiency, unspecified: Secondary | ICD-10-CM

## 2020-03-13 DIAGNOSIS — Z794 Long term (current) use of insulin: Secondary | ICD-10-CM

## 2020-03-13 DIAGNOSIS — E1122 Type 2 diabetes mellitus with diabetic chronic kidney disease: Secondary | ICD-10-CM

## 2020-03-13 MED ORDER — TRIAMCINOLONE ACETONIDE 0.1 % EX OINT
1.0000 | TOPICAL_OINTMENT | Freq: Two times a day (BID) | CUTANEOUS | 1 refills | Status: DC
Start: 2020-03-13 — End: 2022-06-25

## 2020-03-13 NOTE — Progress Notes (Signed)
FOLLOW UP  Assessment and Plan:   Atherosclerosis of aorta Per CT 2019 Control blood pressure, cholesterol, glucose, increase exercise.   Hypertension Well controlled without medications Monitor blood pressure at home; patient to call if consistently greater than 130/80 Continue DASH diet.   Reminder to go to the ER if any CP, SOB, nausea, dizziness, severe HA, changes vision/speech, left arm numbness and tingling and jaw pain.  Mitral valve annular calcification  Discussed; no regurgitation on recent  ECHO; no concerning sx at this time She declines cardiology referral today  Discussed need for better cholesterol control, STOP SMOKING Contact office or go to ED if any chest pain, dyspnea, sudden edema, syncope  Cholesterol Currently above goal; continue fenofibrate daily, try increasing rosuvastatin to twice weekly  LDL goal <70 due to numerous risk factors She understands risk of MI/Stroke with poorly controlled diabetes, cholesterol and smoking Continue low cholesterol diet and exercise.  Check lipid panel.   Diabetes with other diabetic neurologic complication Overall poorly compliant and very poor control Scared of needles and refuses to check glucose Continue medication: taking glimepiride 4 mg daily and tolerating well, discussed resume metformin 500 mg 1-2 tabs daily- check with pharmacy about resuming XR  Does well with farxiga; given samples and will attempt to reorder, coupon given as now with commercial insurance Continue diet and exercise.  Perform daily foot/skin check, notify office of any concerning changes.  Check A1C  BMI 28 Long discussion about weight loss, diet, and exercise Recommended diet heavy in fruits and veggies and low in animal meats, cheeses, and dairy products, appropriate calorie intake Discussed ideal weight for height  Will follow up in 3 months  Vitamin D Def Taking 5000 IU daily  Check vitamin D   Tobacco use Discussed risks  associated with tobacco use and advised to reduce or quit Patient is not ready to do so, but advised to consider strongly Continue annual CT screening   Leukocytosis Has seen [name] recently, recommended continued follow up, she declines at this time CBC today. Also check B12, folate. Discussed referral back if any changes  Depression in partial remission/ Insomnia Continue celexa 40 mg daily which has been helpful Limited benefit with seroquel though has improved mood and wants to continue; didn't do well with trazodone or gabapentin She reports does well with 0.25 mg PRN xanax; she is limiting to <5 days/week onitor frequency/PDMP closely, no dose increases Good sleep hygiene discussed, increase day time activity  Continue diet and meds as discussed. Further disposition pending results of labs. Discussed med's effects and SE's.   Over 30 minutes of exam, counseling, chart review, and critical decision making was performed.   No future appointments.  ----------------------------------------------------------------------------------------------------------------------  HPI 64 y.o. female, cash pay, presents for 3 month follow up on hypertension, cholesterol, diabetes, weight and vitamin D deficiency.   She has hx of colon cancer in 2010, s/p resection and established with [name]; she has had unexplained leukocytosis and microcytosis documented since 2010.  Etiology remains unclear with possible myeloproliferative disorder and was referred back to Oncology [name] in 10/2017 for evaluation, apparently hx of negative biopsy, neg smear and JAK2 and recommended monitoring due to 15 year interval stability unless further WBC changes.  she has a diagnosis of depression and is currently on celexa 40 mg daily, reports symptoms are well controlled on current regimen. she additionally has insomnia, taking seroquel 25 mg at night but while helps with mood hasn't improved sleep. After failing  gabapentin and trazodone, she has been on xanax, takes 0.25 mg as needed for sleep, limiting to every 3-4 days.   She continues to smoke, has cut back to <1 pack a day. She had benign CT chest 01/04/2020.   BMI is Body mass index is 28.52 kg/m., she has been working on diet, she was walking but foot pain, getting a stationary bike this weekend.  She is still drinking 1 bottle diet soda daily, drinks 1 cups of water daily. No sugar soda/tea.  She admits eating only 1 meal a day, typically has sandwich with multigrain bread, and will eat fruit if she has it. Will eat salad occasionally with grilled chicken.  Very poorly motivated to work on lifestyle. Wt Readings from Last 3 Encounters:  03/13/20 161 lb (73 kg)  01/07/20 157 lb (71.2 kg)  12/10/19 158 lb (71.7 kg)   She has aortic atherosclerosis per CT 2019. Had recent ECHO 03/07/2020 due to ? pulmonary artery dilation on CT, showed normal PA pressures, grade 1 diastolic dysfunction (relaxation) with EF 60-65%, mod/severe mitral valve annual calcification without regurgitation.   Her blood pressure has been controlled at home, today their BP is BP: 122/66  She does not workout. She denies chest pain, shortness of breath, dizziness.   She is on cholesterol medication (rosuvastatin 40 mg but doesn't take frequently as causes GI discomfort - all meds do, only taking once a week or so, has been taking fenofibrate 145 mg daily as does fairly with this) and denies myalgias. Her cholesterol is not at goal. The cholesterol last visit was:   Lab Results  Component Value Date   CHOL 221 (H) 12/10/2019   HDL 32 (L) 12/10/2019   [place]  12/10/2019     Comment:     . LDL cholesterol not calculated. Triglyceride levels greater than 400 mg/dL invalidate calculated LDL results. . Reference range: <100 . Desirable range <100 mg/dL for primary prevention;   <70 mg/dL for patients with CHD or diabetic patients  with > or = 2 CHD risk factors. Marland Kitchen LDL-C  is now calculated using the [name]  calculation, which is a validated novel method providing  better accuracy than the Friedewald equation in the  estimation of LDL-C.  [name] et al. [name]. 3300;762(26): 2061-2068  (http://education.[facility].com/faq/FAQ164)    TRIG 559 (H) 12/10/2019   CHOLHDL 6.9 (H) 12/10/2019    She has been working on diet and diabetes with CKD, and denies foot ulcerations, increased appetite, nausea, polydipsia, polyuria, vomiting and weight loss. She does not currently take sugars at home, adamantly refuses to do so. She has reported intolerance to multiple oral agents and has been poorly compliant with therapy, and is poorly compliant with dietary recommendations as well. Consequently A1Cs have been very poorly controlled.  She has not been taking metformin since last visit, 1 tab BID didn't cause diarrhea but GI pain, has tolerated glimepiride 4 mg daily and has been taking this daily.  Tried samples of onglyza, trajenta, jardiance, farxiga (liked farxiga but $400 with insurance).  Last A1C in the office was:  Lab Results  Component Value Date   HGBA1C 9.8 (H) 12/10/2019    CKD 1/2 monitored at this office:  Lab Results  Component Value Date   GFRNONAA 91 12/10/2019   Patient is on Vitamin D supplement, Taking 5000 IU daily when she remembers.  Lab Results  Component Value Date   VD25OH 10 (L) 10/04/2018     Lab Results  Component  Value Date   WBC 17.3 (H) 12/10/2019   HGB 13.1 12/10/2019   HCT 42.9 12/10/2019   MCV 62.9 (L) 12/10/2019   PLT 433 (H) 12/10/2019   Lab Results  Component Value Date   FXTKWIOX73 532 03/21/2014   Lab Results  Component Value Date   IRON 86 11/03/2017   TIBC 334 11/03/2017   FERRITIN 130 11/03/2017   No results found for: FOLATE   Current Medications:  Current Outpatient Medications on File Prior to Visit  Medication Sig  . ALPRAZolam (XANAX) 0.5 MG tablet TAKE 1/2 TO 1 (ONE-HALF TO ONE) TABLET  BY MOUTH ONCE DAILY AT BEDTIME AS NEEDED FOR ANXIETY OR  SLEEP  . CHOLECALCIFEROL PO Take 5,000 Units by mouth daily.  . citalopram (CELEXA) 40 MG tablet Take 1 tablet by mouth once daily  . fenofibrate (TRICOR) 145 MG tablet Take 1 tablet (145 mg total) by mouth daily.  Marland Kitchen glimepiride (AMARYL) 4 MG tablet Take 1 tablet (4 mg total) by mouth daily with breakfast.  . nystatin cream (MYCOSTATIN) Apply 1 application topically 2 (two) times daily.  . promethazine (PHENERGAN) 25 MG tablet Take 1 tablet (25 mg total) by mouth every 6 (six) hours as needed for nausea or vomiting.  Marland Kitchen QUEtiapine (SEROQUEL) 25 MG tablet Take 1 tablet at Bedtime for Sleep  . rosuvastatin (CRESTOR) 40 MG tablet Take 1 tablet (40 mg total) by mouth daily.  . metFORMIN (GLUCOPHAGE) 500 MG tablet Take 4 tabs daily with meals; spread over the course of the day, take 2 tabs with largest meal. (Patient not taking: Reported on 03/13/2020)   No current facility-administered medications on file prior to visit.     Allergies:  Allergies  Allergen Reactions  . Codeine   . Glipizide Nausea And Vomiting  . Lunesta [Eszopiclone]     Excessive fatigue  . Welchol [Colesevelam Hcl]     nausea      Medical History:  Past Medical History:  Diagnosis Date  . Anxiety   . Cervical dysplasia   . Colon cancer ([place])   . Depression   . Diabetes ([place])   . History of colon cancer, stage II 09/18/2012  . Hyperlipidemia   . Right-sided Bell's palsy 01/03/2019  . Vitamin D deficiency    Family history- Reviewed and unchanged Social history- Reviewed and unchanged   Review of Systems:  Review of Systems  Constitutional: Negative for malaise/fatigue and weight loss.  HENT: Negative for hearing loss and tinnitus.   Eyes: Negative for blurred vision and double vision.  Respiratory: Negative for cough, shortness of breath and wheezing.   Cardiovascular: Negative for chest pain, palpitations, orthopnea, claudication and leg swelling.   Gastrointestinal: Positive for abdominal pain and constipation. Negative for blood in stool, diarrhea, heartburn, melena, nausea and vomiting.  Genitourinary: Negative.   Musculoskeletal: Negative for joint pain (feet improved with tumeric) and myalgias.  Skin: Negative for rash.  Neurological: Negative for dizziness, tingling, sensory change, weakness and headaches.  Endo/Heme/Allergies: Negative for polydipsia.  Psychiatric/Behavioral: Negative for depression, substance abuse and suicidal ideas. The patient has insomnia. The patient is not nervous/anxious.   All other systems reviewed and are negative.   Physical Exam: BP 122/66   Pulse (!) 53   Temp (!) 96.8 F (36 C)   Wt 161 lb (73 kg)   SpO2 96%   BMI 28.52 kg/m  Wt Readings from Last 3 Encounters:  03/13/20 161 lb (73 kg)  01/07/20 157 lb (71.2 kg)  12/10/19 158 lb (71.7 kg)   General Appearance: Well nourished, in no apparent distress. Eyes: PERRLA, EOMs, conjunctiva no swelling or erythema Sinuses: No Frontal/maxillary tenderness ENT/Mouth: Ext aud canals clear, TMs without erythema, bulging. No erythema, swelling, or exudate on post pharynx.  Tonsils not swollen or erythematous. Hearing normal.  Neck: Supple, thyroid normal.  Respiratory: Respiratory effort normal, BS equal bilaterally without rales, rhonchi, wheezing or stridor.  Cardio: RRR with no MRGs. Brisk peripheral pulses without edema.  Abdomen: Soft, + BS.  Generalized superficial tenderness of R and lower abdomen, non-localized/specifit, no guarding, rebound, hernias, palpable masses.  Lymphatics: Non tender without lymphadenopathy.  Musculoskeletal: Full ROM, 5/5 strength, Normal gait.  Skin: Warm, dry without rashes, lesions; she has ecchymosis to bil upper arms, hands Neuro: Cranial nerves intact. No cerebellar symptoms.  Psych: Awake and oriented X 3, depressed/sad affect, Insight and Judgment appropriate.    Izora Ribas, NP 5:24 PM [facility]  Adult & Adolescent Internal Medicine

## 2020-03-13 NOTE — Patient Instructions (Signed)
Mitral Valve Stenosis  Mitral valve stenosis is a narrowing of the mitral valve, which is the valve between the upper left chamber (left atrium) and the lower left chamber (left ventricle) of the heart. This condition can limit blood flow between the left atrium and the left ventricle. If you have this condition, your health care provider may hear an abnormal sound (heart murmur) while listening to your heart. This condition can range from mild to severe. What are the causes? This condition may be caused by:  Rheumatic fever, which is a complication of a strep infection.  A buildup of calcium around the valve. This can occur with aging.  A problem that is present at birth (congenital defect).  Certain long-term (chronic) diseases. What are the signs or symptoms? Symptoms of this condition include:  Shortness of breath.  Cough.  Noisy breathing (wheezing).  Tiredness (fatigue) or decreased energy.  Fast or irregular heartbeats (palpitations).  Heart murmur.  Chest pain.  Pain in the arm, neck, jaw, or face.  Swollen feet or ankles.  Hoarse voice.  Pink and purple patches of skin on the face. How is this diagnosed? This condition may be diagnosed based on:  A physical exam. Your health care provider will check for a heart murmur by listening to your heart.  Tests, including: ? A test that creates ultrasound images of the heart that allow your health care provider to see how the heart valves work while your heart is beating (echocardiogram). ? A test that records the electrical impulses of the heart (electrocardiogram). ? Exercise stress tests. These are tests that evaluate the blood supply to your heart and your heart's response to exercise. ? A test to look at the structure and function of the heart (cardiac catheterization). A thin tube (catheter) is passed through the blood vessels and into the heart. Dye is injected into the blood vessels so the cardiac system can be  seen on images that are taken. ? Chest X-rays. How is this treated? Treatment for this condition depends on the severity of the condition. It may include:  Medicines to keep the heart rate regular.  Medicines to control blood pressure.  Blood thinners (anticoagulants) to prevent blood clots.  Antibiotic medicines to prevent infections. Defective heart valves are more likely to become infected.  A procedure in which a catheter is passed through a blood vessel to the heart and then a tiny balloon is inflated to open the mitral valve (percutaneous balloon valvuloplasty).  Open heart surgery to repair or replace the mitral valve. Follow these instructions at home: Medicines  Take over-the-counter and prescription medicines only as told by your health care provider.  If you were prescribed an antibiotic medicine, take it as told by your health care provider. Do not stop taking the antibiotic even if you start to feel better.  If you are taking blood thinners: ? Talk with your health care provider before you take any medicines that contain aspirin or NSAIDs. These medicines increase your risk for dangerous bleeding. ? Take your medicine exactly as told, at the same time every day. ? Avoid activities that could cause injury or bruising. ? Follow instructions about how to prevent falls. ? Wear a medical alert bracelet or carry a card that lists what medicines you take. Lifestyle  Do not use any products that contain nicotine or tobacco, such as cigarettes, e-cigarettes, and chewing tobacco. If you need help quitting, ask your health care provider.  Achieve and maintain a healthy  weight.  Ask your health care provider what kinds of exercise are safe for you.  If you are taking blood thinners, avoid activities that have a high risk of injury. Ask your health care provider what activities are safe for you. Eating and drinking  Limit your intake of caffeine and alcohol. Both of these  substances can affect your heart's rate and rhythm.  If you drink alcohol: ? Limit how much you use to:  0-1 drink a day for nonpregnant women.  0-2 drinks a day for men. ? Be aware of how much alcohol is in your drink. In the [place] one drink equals one 12 oz bottle of beer (355 mL), one 5 oz glass of wine (148 mL), or one 1 oz glass of hard liquor (44 mL).  Eat a heart-healthy diet that includes plenty of fresh fruits and vegetables, whole grains, low-fat (lean) proteins, and low-fat or nonfat dairy products. Consider working with a dietitian to help you make healthy food choices.  Limit the amount of salt (sodium) in your diet. Avoid adding salt to foods, and avoid foods that are high in salt, such as: ? Pickles. ? Smoked and cured meats. ? Processed foods.  General instructions  Before any dental procedures, tell your dentist that you have mitral valve stenosis. You may need antibiotics to prevent a heart infection.  If you plan to become pregnant, talk with your health care provider first.  Keep all follow-up visits as told by your health care provider. This is important. Contact a health care provider if you:  Have a fever.  Feel more tired than usual when doing physical activity.  Have a dry cough. Get help right away if you:  Have pain or pressure in your chest that does not go away.  Have difficulty breathing.  Have palpitations.  Have a sudden weight gain.  Have swelling in your feet, ankles, or legs.  Have trouble staying awake or you faint.  Feel confused. These symptoms may represent a serious problem that is an emergency. Do not wait to see if the symptoms will go away. Get medical help right away. Call your local emergency services (911 in the [place] Do not drive yourself to the hospital. Summary  Mitral valve stenosis is a narrowing of the mitral valve. This condition can limit blood flow on the left side of your heart.  Depending on how severe your  condition is, you may be treated with medicines, a procedure, or surgery.  Practice heart-healthy habits to manage this condition. These include limiting alcohol, avoiding nicotine and tobacco, and eating a heart-healthy diet that is low in salt (sodium). This information is not intended to replace advice given to you by your health care provider. Make sure you discuss any questions you have with your health care provider. Document Revised: 04/30/2018 Document Reviewed: 04/30/2018 Elsevier Patient Education  2020 [employer].

## 2020-03-14 ENCOUNTER — Other Ambulatory Visit: Payer: Self-pay | Admitting: Adult Health

## 2020-03-14 LAB — CBC WITH DIFFERENTIAL/PLATELET
Absolute Monocytes: 621 cells/uL (ref 200–950)
Basophils Absolute: 97 cells/uL (ref 0–200)
Basophils Relative: 0.7 %
Eosinophils Absolute: 276 cells/uL (ref 15–500)
Eosinophils Relative: 2 %
HCT: 40 % (ref 35.0–45.0)
Hemoglobin: 12.2 g/dL (ref 11.7–15.5)
Lymphs Abs: 5009 cells/uL — ABNORMAL HIGH (ref 850–3900)
MCH: 19.1 pg — ABNORMAL LOW (ref 27.0–33.0)
MCHC: 30.5 g/dL — ABNORMAL LOW (ref 32.0–36.0)
MCV: 62.6 fL — ABNORMAL LOW (ref 80.0–100.0)
MPV: 9.7 fL (ref 7.5–12.5)
Monocytes Relative: 4.5 %
Neutro Abs: 7797 cells/uL (ref 1500–7800)
Neutrophils Relative %: 56.5 %
Platelets: 473 10*3/uL — ABNORMAL HIGH (ref 140–400)
RBC: 6.39 10*6/uL — ABNORMAL HIGH (ref 3.80–5.10)
RDW: 18.4 % — ABNORMAL HIGH (ref 11.0–15.0)
Total Lymphocyte: 36.3 %
WBC: 13.8 10*3/uL — ABNORMAL HIGH (ref 3.8–10.8)

## 2020-03-14 LAB — COMPLETE METABOLIC PANEL WITH GFR
AG Ratio: 1.6 (calc) (ref 1.0–2.5)
ALT: 13 U/L (ref 6–29)
AST: 12 U/L (ref 10–35)
Albumin: 4.7 g/dL (ref 3.6–5.1)
Alkaline phosphatase (APISO): 50 U/L (ref 37–153)
BUN: 16 mg/dL (ref 7–25)
CO2: 27 mmol/L (ref 20–32)
Calcium: 10.2 mg/dL (ref 8.6–10.4)
Chloride: 100 mmol/L (ref 98–110)
Creat: 0.96 mg/dL (ref 0.50–0.99)
GFR, Est African American: 71 mL/min/{1.73_m2} (ref >=60)
GFR, Est Non African American: 62 mL/min/{1.73_m2} (ref >=60)
Globulin: 2.9 g/dL (calc) (ref 1.9–3.7)
Glucose, Bld: 263 mg/dL — ABNORMAL HIGH (ref 65–99)
Potassium: 4.9 mmol/L (ref 3.5–5.3)
Sodium: 137 mmol/L (ref 135–146)
Total Bilirubin: 0.4 mg/dL (ref 0.2–1.2)
Total Protein: 7.6 g/dL (ref 6.1–8.1)

## 2020-03-14 LAB — LIPID PANEL
Cholesterol: 189 mg/dL (ref <200)
HDL: 35 mg/dL — ABNORMAL LOW (ref >=50)
LDL Cholesterol (Calc): 127 mg/dL (calc) — ABNORMAL HIGH
Non-HDL Cholesterol (Calc): 154 mg/dL (calc) — ABNORMAL HIGH (ref <130)
Total CHOL/HDL Ratio: 5.4 (calc) — ABNORMAL HIGH (ref <5.0)
Triglycerides: 155 mg/dL — ABNORMAL HIGH (ref <150)

## 2020-03-14 LAB — URINALYSIS, ROUTINE W REFLEX MICROSCOPIC
Bilirubin Urine: NEGATIVE
Hgb urine dipstick: NEGATIVE
Ketones, ur: NEGATIVE
Leukocytes,Ua: NEGATIVE
Nitrite: NEGATIVE
Protein, ur: NEGATIVE
Specific Gravity, Urine: 1.033 (ref 1.001–1.03)
pH: 6.5 (ref 5.0–8.0)

## 2020-03-14 LAB — VITAMIN B12: Vitamin B-12: 324 pg/mL (ref 200–1100)

## 2020-03-14 LAB — HEMOGLOBIN A1C
Hgb A1c MFr Bld: 9.1 % of total Hgb — ABNORMAL HIGH (ref <5.7)
Mean Plasma Glucose: 214 (calc)
eAG (mmol/L): 11.9 (calc)

## 2020-03-14 LAB — VITAMIN D 25 HYDROXY (VIT D DEFICIENCY, FRACTURES): Vit D, 25-Hydroxy: 15 ng/mL — ABNORMAL LOW (ref 30–100)

## 2020-03-14 LAB — FOLATE RBC: RBC Folate: 730 ng/mL RBC (ref >280)

## 2020-03-14 LAB — TSH: TSH: 4.23 mIU/L (ref 0.40–4.50)

## 2020-03-14 MED ORDER — GLIMEPIRIDE 4 MG PO TABS
6.0000 mg | ORAL_TABLET | Freq: Every day | ORAL | 1 refills | Status: DC
Start: 2020-03-14 — End: 2020-03-17

## 2020-03-14 MED ORDER — METFORMIN HCL 500 MG PO TABS: ORAL_TABLET | ORAL | 1 refills | Status: DC

## 2020-03-17 ENCOUNTER — Other Ambulatory Visit: Payer: Self-pay | Admitting: Adult Health

## 2020-03-17 DIAGNOSIS — G4700 Insomnia, unspecified: Secondary | ICD-10-CM

## 2020-03-17 DIAGNOSIS — F3341 Major depressive disorder, recurrent, in partial remission: Secondary | ICD-10-CM

## 2020-03-17 MED ORDER — ALPRAZOLAM 0.5 MG PO TABS: ORAL_TABLET | ORAL | 0 refills | Status: DC

## 2020-03-17 MED ORDER — QUETIAPINE FUMARATE 25 MG PO TABS: ORAL_TABLET | ORAL | 1 refills | Status: DC

## 2020-03-17 MED ORDER — GLIMEPIRIDE 4 MG PO TABS: 6.0000 mg | ORAL_TABLET | Freq: Every day | ORAL | 1 refills | Status: DC

## 2020-03-17 MED ORDER — CITALOPRAM HYDROBROMIDE 20 MG PO TABS: 20.0000 mg | ORAL_TABLET | Freq: Every day | ORAL | 1 refills | Status: DC

## 2020-03-17 NOTE — Progress Notes (Signed)
Future Appointments  Date Time Provider [place]  06/24/2020  2:00 PM Liane Comber, NP [facility] None   PDMP rechecked for xanax refill request.

## 2020-06-24 ENCOUNTER — Other Ambulatory Visit: Payer: Self-pay

## 2020-06-24 ENCOUNTER — Encounter: Payer: Self-pay | Admitting: Adult Health

## 2020-06-24 ENCOUNTER — Ambulatory Visit (INDEPENDENT_AMBULATORY_CARE_PROVIDER_SITE_OTHER): Payer: Medicare Other | Admitting: Adult Health

## 2020-06-24 VITALS — BP 110/68 | HR 82 | Temp 97.3°F | Ht 62.5 in | Wt 163.0 lb

## 2020-06-24 DIAGNOSIS — Z794 Long term (current) use of insulin: Secondary | ICD-10-CM

## 2020-06-24 DIAGNOSIS — Z1389 Encounter for screening for other disorder: Secondary | ICD-10-CM

## 2020-06-24 DIAGNOSIS — C189 Malignant neoplasm of colon, unspecified: Secondary | ICD-10-CM

## 2020-06-24 DIAGNOSIS — D72829 Elevated white blood cell count, unspecified: Secondary | ICD-10-CM

## 2020-06-24 DIAGNOSIS — I444 Left anterior fascicular block: Secondary | ICD-10-CM | POA: Insufficient documentation

## 2020-06-24 DIAGNOSIS — E1169 Type 2 diabetes mellitus with other specified complication: Secondary | ICD-10-CM

## 2020-06-24 DIAGNOSIS — E1122 Type 2 diabetes mellitus with diabetic chronic kidney disease: Secondary | ICD-10-CM

## 2020-06-24 DIAGNOSIS — E785 Hyperlipidemia, unspecified: Secondary | ICD-10-CM

## 2020-06-24 DIAGNOSIS — M25571 Pain in right ankle and joints of right foot: Secondary | ICD-10-CM

## 2020-06-24 DIAGNOSIS — Z0000 Encounter for general adult medical examination without abnormal findings: Secondary | ICD-10-CM

## 2020-06-24 DIAGNOSIS — F17200 Nicotine dependence, unspecified, uncomplicated: Secondary | ICD-10-CM

## 2020-06-24 DIAGNOSIS — I3481 Nonrheumatic mitral (valve) annulus calcification: Secondary | ICD-10-CM

## 2020-06-24 DIAGNOSIS — N182 Chronic kidney disease, stage 2 (mild): Secondary | ICD-10-CM

## 2020-06-24 DIAGNOSIS — I059 Rheumatic mitral valve disease, unspecified: Secondary | ICD-10-CM | POA: Diagnosis not present

## 2020-06-24 DIAGNOSIS — F419 Anxiety disorder, unspecified: Secondary | ICD-10-CM

## 2020-06-24 DIAGNOSIS — I700 Atherosclerosis of aorta: Secondary | ICD-10-CM

## 2020-06-24 DIAGNOSIS — Z136 Encounter for screening for cardiovascular disorders: Secondary | ICD-10-CM

## 2020-06-24 DIAGNOSIS — R140 Abdominal distension (gaseous): Secondary | ICD-10-CM

## 2020-06-24 DIAGNOSIS — R1084 Generalized abdominal pain: Secondary | ICD-10-CM

## 2020-06-24 DIAGNOSIS — M25572 Pain in left ankle and joints of left foot: Secondary | ICD-10-CM

## 2020-06-24 DIAGNOSIS — I2510 Atherosclerotic heart disease of native coronary artery without angina pectoris: Secondary | ICD-10-CM | POA: Insufficient documentation

## 2020-06-24 DIAGNOSIS — K5900 Constipation, unspecified: Secondary | ICD-10-CM

## 2020-06-24 DIAGNOSIS — E663 Overweight: Secondary | ICD-10-CM

## 2020-06-24 DIAGNOSIS — E559 Vitamin D deficiency, unspecified: Secondary | ICD-10-CM

## 2020-06-24 DIAGNOSIS — R030 Elevated blood-pressure reading, without diagnosis of hypertension: Secondary | ICD-10-CM

## 2020-06-24 DIAGNOSIS — F3341 Major depressive disorder, recurrent, in partial remission: Secondary | ICD-10-CM

## 2020-06-24 MED ORDER — GLIMEPIRIDE 4 MG PO TABS: 4.0000 mg | ORAL_TABLET | Freq: Two times a day (BID) | ORAL | 1 refills | Status: DC

## 2020-06-24 NOTE — Progress Notes (Signed)
CPE AND FOLLOW UP  Assessment:    Dorothy Boyd was seen today for follow-up and wellness.  Diagnoses and all orders for this visit:  Encounter for routine adult health examination without abnormal findings Due annually Declines vaccines  Declines colon cancer screening or follow up  DEXA ordered, patient is declining to schedule at this time Declines PAP/pelvic exam   Aortic atherosclerosis ([place]) Per CT 2019 Control blood pressure, cholesterol, glucose, increase exercise.  -     Lipid panel  3 Vessel CAD STOP SMOKING Denies angina Control blood pressure, cholesterol, glucose, increase exercise.  Declines cardiology evaluation despite discussion of risks Recommend bASA  Mitral valve annular calcificiation Denies concerning sx; stop smoking, control cholesterol and sugars Recommended cardiology evaluation; she declines firmly   Malignant neoplasm of colon, unspecified part of colon ([place]) Hx of, s/p resection, overdue for screening/follow up, PATIENT DECLINES ADAMANTLY   Hyperlipidemia associated with type 2 diabetes mellitus ([place]) Complicated by poor compliance and med SE Reports tolerating rosuvastatin 40 mg daily LDL goal <70 Continue low cholesterol diet and exercise.  Check lipid panel.  -     Lipid panel -     TSH  Type 2 diabetes mellitus with stage 1 chronic kidney disease, with long-term current use of insulin ([name]) Type 2 Diabetes Mellitus - poorly controlled Complicated by non-compliance, med intolerance Intolerance: metformin, trulicity, LMBE6L (numerous) ABSOLUTELY DECLINES INSULIN OR CHECKING GLUCOSE Maximized on glimepiride  Lifestyle emphasized  Education: Reviewed 'ABCs' of diabetes management (respective goals in parentheses):  A1C (<7), blood pressure (<130/80), and cholesterol (LDL <70) Eye Exam yearly and Dental Exam every 6 months.- eye report requested Dietary recommendations Physical Activity recommendations -     COMPLETE METABOLIC PANEL WITH  GFR -     Hemoglobin A1c  CKD stage 2 due to type 2 diabetes mellitus ([name]) Increase fluids, avoid NSAIDS, monitor sugars, will monitor  Depression, major, recurrent, in partial remission ([name]) Celexa 20 mg daily  Follow up sooner if SE or other concerns, continue seroquel Lifestyle discussed: diet/exerise, sleep hygiene, stress management, hydration  Vitamin D deficiency Encouraged taking supplement consistently, check levels next visit   Smoker Discussed risks associated with tobacco use and advised to reduce or quit Patient is not ready to do so, but advised to consider strongly Will follow up at the next visit -lung cancer screening with low dose CT discussed as recommended by guidelines based on age, number of pack year history.  Discussed risks of screening including but not limited to false positives on xray, further testing or consultation with specialist, and possible false negative CT as well. Understanding expressed and wishes to proceed with CT testing. Continue annually, due in 11/2020 -     EKG 12-Lead  Overweight (BMI 25.0-29.9) Continue to recommend diet heavy in fruits and veggies and low in animal meats, cheeses, and dairy products, appropriate calorie intake Discuss exercise recommendations routinely Continue to monitor weight at each visit  Leukocytosis, unspecified type Follow up [name] if trending up; monitoring q48mhere -     CBC with Differential/Platelet  Arthralgia of both feet Continue tylenol, monitor   Anxiety Continue celexa Stress management techniques discussed, increase water, good sleep hygiene discussed, increase exercise, and increase veggies.  Call the office if any new AE's from medications and we will switch them  Poor compliance Continue to discuss barriers, encourage compliance  Estrogen deficiency  Order in system; patient is declining to schedule at this time -     DG Bone  Density; Future  Generalized abdominal  pain/tenderness Without rebound but very tender overall With distention and no BM in over 1 week Will order CT r/o or obstruction, discussed risk perforation Hx of GI surgeries, colon cancer s/p resection and declined follow up R/o last mass, may have adhesions Also reporting persistent pelvic fullness/discomfort x 1 year; recommended pelvic but declines adamantly Constipation: r/o obstruction, then can do mag citrate 1/2 bottle with 2 glasses of water, repeat in 4 hours if needed Start daily miralax, and bisacodyl or senna Increase water intake, fiber intake, increase exercise Discussed needs to have BM at least every other day  Orders Placed This Encounter  Procedures  . CT Abdomen Pelvis W Contrast  . CBC with Differential/Platelet  . COMPLETE METABOLIC PANEL WITH GFR  . Magnesium  . Lipid panel  . TSH  . Hemoglobin A1c  . Microalbumin / creatinine urine ratio  . Urinalysis, Routine w reflex microscopic  . EKG 12-Lead  . HM DIABETES FOOT EXAM    Over 40 minutes of exam, counseling, chart review and critical decision making was performed Future Appointments  Date Time Provider [place]  06/26/2020 10:40 AM [facility] 1 [facility]  09/23/2020  3:30 PM Liane Comber, NP [facility] None  12/24/2020  4:00 PM Unk Pinto, MD [facility] None  06/23/2021  2:00 PM Liane Comber, NP [facility] None     Plan:   During the course of the visit the patient was educated and counseled about appropriate screening and preventive services including:    Pneumococcal vaccine   Prevnar 13  Influenza vaccine  Td vaccine  Screening electrocardiogram  Bone densitometry screening  Colorectal cancer screening  Diabetes screening  Glaucoma screening  Nutrition counseling   Advanced directives: requested   Subjective:  Dorothy Boyd is a 65 y.o. female who presents for CPE and 3 month follow up. She has Leukocytosis; Hyperlipidemia associated with type 2  diabetes mellitus ([place]); Diabetes ([place]); Colon cancer ([place]); Vitamin D deficiency; Anxiety; Overweight (BMI 25.0-29.9); Arthralgia of both feet; Depression, major, recurrent, in partial remission ([place]); Smoker; CKD stage 2 due to type 2 diabetes mellitus ([place]); Aortic atherosclerosis ([place]); Poor compliance; and Mitral valve annular calcification on their problem list.  She is married, 2 children, 5 grandchildren. She is retired Medical sales representative work.   She has hx of colon cancer in 2010, s/p resection and established with [name]; she has had unexplained leukocytosis and microcytosis documented since 2010.  Etiology remains unclear with possible myeloproliferative disorder and was referred back to Oncology for evaluation and was felt to be stable in 01/2018 by [name], was recommended monitoring and repeat bone marrow biopsy if trending up. She was cash pay at the time and did not follow up, CBCs are monitored q35mhere. She is recommended colonoscopy follow up for colon cancer screening, has been adamantly declining this.   she has a diagnosis of depression and is currently on celexa 20 mg daily daily for mood and doing well. Also on seroquel and finds this helpful for sleep, failed trazodone and gabeptnin. She has been on xanax, takes 0.25 mg as needed for sleep a few days a week.   She continues to smoke, has cut back to <1 pack a day, but has 30+ year smoking history, since 1978. Denies cough, dyspnea.  She had CT lung cancer screening 12/2019 without concerning nodule but suggestive of small airway disease.   BMI is Body mass index is 29.34 kg/m., she has not been  working on diet and exercise. she has been working on diet, she is walking doing stairs a few days a week, 10-15 min.  Feet aching and bother her, saw podiatry and advised arthritis and taking tylenol with some benefit though limits time on her feet, wearing copper foot bands with some perceived benefit. She is still drinking a some diet soda  daily (3 x 12 oz), admits minimal water.  She admits eating irregular, some times only 1 meal a day, typically has sandwich with multigrain bread, crackers, etc, and will eat fruit. Very poorly motivated to work on lifestyle. Wt Readings from Last 3 Encounters:  06/24/20 163 lb (73.9 kg)  03/13/20 161 lb (73 kg)  01/07/20 157 lb (71.2 kg)   Has aortic atherosclerosis and 3 vessel CAD per CT 11/2017, 11/2019.  CT chest showed dilated pulm arteries; underwent ECHO 03/06/2020 which showed normal pulmonary artery pressure, no atypical dilation, no hypertrophy. Did incidentally note mitral valve has moderate to severe calcification (hardening) - no regurgitation or noted stenosis. Also known 3 vessel CAD per CT chest. Recommended cardiology evluation which she continues to decline. Discussed risk of MI, death.   She does have BP cuff, 120s/70s at home is BP: 110/68 She does workout. She denies chest pain, shortness of breath, dizziness.   She is on cholesterol medication (rosuvastatin 40 mg daily, new since last visit) and denies myalgias. Her cholesterol is not at goal. The cholesterol last visit was:   Lab Results  Component Value Date   CHOL 189 03/13/2020   HDL 35 (L) 03/13/2020   LDLCALC 127 (H) 03/13/2020   TRIG 155 (H) 03/13/2020   CHOLHDL 5.4 (H) 03/13/2020   She has had diabetes for 10 + years.   She has been working on diet (limiting sweets) for T2 diabetes with CKD, no longer taking on metformin (abdominal pain and nausesa with 1 tab) - Taking glimepiride 4 mg BID daily Didn't do well with SGLT2i (was given numerous samples, reports had persistent yeast while taking) and denies foot ulcerations, increased appetite, nausea, paresthesia of the feet, polydipsia, polyuria, visual disturbances and weight loss.  Patient does NOT check glucose - needle phobia and adamantly declines,  Admittedly poorly compliant with lifestyle and medications due to intolerance, typically GI sx Last A1C in  the office was:  Lab Results  Component Value Date   HGBA1C 9.1 (H) 03/13/2020   She has CKD I/II associated with T2DM monitored at this office:  Lab Results  Component Value Date   [facility] 62 03/13/2020   Patient is on Vitamin D supplement, 5000 IU gummies, when she remembers  Lab Results  Component Value Date   VD25OH 15 (L) 03/13/2020        Medication Review: Current Outpatient Medications on File Prior to Visit  Medication Sig Dispense Refill  . ALPRAZolam (XANAX) 0.5 MG tablet TAKE 1/2 TO 1 (ONE-HALF TO ONE) TABLET BY MOUTH ONCE DAILY AT BEDTIME AS NEEDED FOR ANXIETY OR  SLEEP 30 tablet 0  . CHOLECALCIFEROL PO Take 5,000 Units by mouth daily.    . citalopram (CELEXA) 20 MG tablet Take 1 tablet (20 mg total) by mouth daily. 90 tablet 1  . nystatin cream (MYCOSTATIN) Apply 1 application topically 2 (two) times daily. 30 g 1  . promethazine (PHENERGAN) 25 MG tablet Take 1 tablet (25 mg total) by mouth every 6 (six) hours as needed for nausea or vomiting. 30 tablet 3  . QUEtiapine (SEROQUEL) 25 MG tablet Take 1  tablet at Bedtime for Sleep 90 tablet 1  . rosuvastatin (CRESTOR) 40 MG tablet Take 1 tablet (40 mg total) by mouth daily. 90 tablet 1  . triamcinolone ointment (KENALOG) 0.1 % Apply 1 application topically 2 (two) times daily. 80 g 1  . fenofibrate (TRICOR) 145 MG tablet Take 1 tablet (145 mg total) by mouth daily. (Patient not taking: Reported on 06/24/2020) 90 tablet 1   No current facility-administered medications on file prior to visit.    Allergies  Allergen Reactions  . Codeine   . Glipizide Nausea And Vomiting  . Lunesta [Eszopiclone]     Excessive fatigue  . Metformin And Related     Nausea and abdominal pain  . Welchol [Colesevelam Hcl]     nausea      Current Problems (verified) Patient Active Problem List   Diagnosis Date Noted  . Mitral valve annular calcification 03/07/2020  . Poor compliance 12/07/2019  . Aortic atherosclerosis ([place])  08/01/2019  . CKD stage 2 due to type 2 diabetes mellitus ([place]) 07/20/2019  . Smoker 04/16/2019  . Depression, major, recurrent, in partial remission ([place]) 01/10/2019  . Arthralgia of both feet 10/04/2018  . Overweight (BMI 25.0-29.9) 09/29/2017  . Hyperlipidemia associated with type 2 diabetes mellitus ([place])   . Diabetes ([place])   . Colon cancer ([place])   . Vitamin D deficiency   . Anxiety   . Leukocytosis 09/18/2012    Screening Tests Immunization History  Administered Date(s) Administered  . Pneumococcal Polysaccharide-23 01/07/2020    Preventative care: Last colonoscopy: 2010 Dr. Earlean Shawl, overdue, hx of colon cancer, patient DECLINES - would not pursue treatment even if diagnosed, states understands risks of death Last mammogram: Dec 30, 2019  Last pap smear/pelvic exam: 05/2013, hx of abnormal but had normal x 2 last checks, declines further DEXA: ordered but patient declines to schedule at this time   Prior vaccinations: TD or Tdap: declines   Influenza: declines Pneumococcal: 12/2019 Prevnar13: declines Shingles/Zostavax: declines  Covid 19: declines   Names of Other Physician/Practitioners you currently use: 1. [facility] here for primary care 2. [facility], eye doctor, last visit 08/2019, reminded to schedule this year 3. Dr. Marland Kitchen dentist, last visit 2019, declines follow up, has full dentures  Patient Care Team: Unk Pinto, MD as PCP - General (Internal Medicine) Wyatt Portela, MD as Consulting Physician (Oncology)  SURGICAL HISTORY She  has a past surgical history that includes Appendectomy; Partial colectomy (2010); Laparoscopic cholecystectomy (2010); and Colonoscopy. FAMILY HISTORY Her family history includes Cancer - Ovarian in her mother; Diabetes in her paternal grandmother and sister; Gastric cancer in her paternal aunt; Heart disease in her brother; Hyperlipidemia in her mother and sister; Hypertension in her  mother. SOCIAL HISTORY She  reports that she has been smoking cigarettes. She started smoking about 44 years ago. She has a 33.00 pack-year smoking history. She has never used smokeless tobacco. She reports that she does not drink alcohol and does not use drugs.  Depression/mood screen:   Depression screen [facility] 2/9 06/24/2020  Decreased Interest 0  Down, Depressed, Hopeless 0  PHQ - 2 Score 0  Altered sleeping 0  Tired, decreased energy 1  Change in appetite 1  Feeling bad or failure about yourself  0  Trouble concentrating 0  Moving slowly or fidgety/restless 0  Suicidal thoughts 0  PHQ-9 Score 2  Difficult doing work/chores Not difficult at all     Review of Systems  Constitutional: Negative for  malaise/fatigue and weight loss.  HENT: Negative for hearing loss and tinnitus.   Eyes: Negative for blurred vision and double vision.  Respiratory: Negative for cough, sputum production, shortness of breath and wheezing.   Cardiovascular: Negative for chest pain, palpitations, orthopnea, claudication, leg swelling and PND.  Gastrointestinal: Positive for abdominal pain (generalized, pelvic/lower abdominal pain x 1 year) and constipation (no BM in 1.5 weeks). Negative for blood in stool, diarrhea, heartburn, melena, nausea and vomiting.  Genitourinary: Negative.   Musculoskeletal: Negative for falls (1 fall in 12 months ), joint pain (bil feet) and myalgias.  Skin: Negative for rash.  Neurological: Negative for dizziness, tingling, sensory change, weakness and headaches.  Endo/Heme/Allergies: Negative for polydipsia.  Psychiatric/Behavioral: Negative for depression, memory loss, substance abuse and suicidal ideas. The patient is not nervous/anxious and does not have insomnia.   All other systems reviewed and are negative.    Objective:     Today's Vitals   06/24/20 1355  BP: 110/68  Pulse: 82  Temp: (!) 97.3 F (36.3 C)  SpO2: 95%  Weight: 163 lb (73.9 kg)  Height: 5' 2.5"  (1.588 m)   Body mass index is 29.34 kg/m.  General appearance: alert, no distress, WD/WN, female HEENT: normocephalic, sclerae anicteric, TMs pearly, nares patent, no discharge or erythema, pharynx normal Oral cavity: MMM, no lesions Neck: supple, no lymphadenopathy, no thyromegaly, no masses Heart: RRR, normal S1, S2, no murmurs  Lungs: CTA bilaterally, no wheezes, rhonchi, or rales Abdomen: +bs, firm, mildly distended, generalized tenderness with light palpation, no distinct palpable masses or organomegaly; tenderness limiting exam Musculoskeletal: nontender, no swelling, no obvious deformity Extremities: no edema, no cyanosis, no clubbing Pulses: 2+ symmetric, upper and lower extremities, normal cap refill Neurological: alert, oriented x 3, CN2-12 intact, strength normal upper extremities and lower extremities, sensation normal throughout, DTRs 2+ throughout, no cerebellar signs, gait slow steady Psychiatric: normal affect, behavior normal, pleasant  Breasts: declines today  GU: declines Rectal: declines  EKG: Sinus rhythm, LAFB   Izora Ribas, NP   06/24/2020

## 2020-06-24 NOTE — Patient Instructions (Addendum)
Dorothy Boyd , Thank you for taking time to come for your Annual Wellness Visit. I appreciate your ongoing commitment to your health goals. Please review the following plan we discussed and let me know if I can assist you in the future.   These are the goals we discussed: Goals    . DIET - EAT MORE FRUITS AND VEGETABLES     Aim for 5-7 servings daily (1/2 cup each)     . DIET - INCREASE WATER INTAKE     Aim to work up to 65+ fluid ounces daily     . Exercise 150 min per week    . Quit Smoking    . Weight (lb) < 150 lb (68 kg)       This is a list of the screening recommended for you and due dates:  Health Maintenance  Topic Date Due  . Urine Protein Check  06/26/2014  . DEXA scan (bone density measurement)  Never done  . COVID-19 Vaccine (1) 07/10/2020*  . Flu Shot  08/28/2020*  . Colon Cancer Screening  12/09/2020*  . Tetanus Vaccine  12/09/2020*  . Hemoglobin A1C  09/11/2020  . Eye exam for diabetics  09/20/2020  . Pneumonia vaccines (2 of 2 - PCV13) 01/06/2021  . Complete foot exam   06/24/2021  . Mammogram  12/25/2021  .  Hepatitis C: One time screening is recommended by Center for Disease Control  (CDC) for  adults born from [birth year] through 1967.   Completed  *Topic was postponed. The date shown is not the original due date.    Recommend get on subligual B12 daily -   Can try colace or miralax (softener) and/or dulcolax or sennakot (stimulant)   Need to have a BM at least every other day  Can do miralax with water every 2 hours until your bowels move Or magnesium citrate, 1/2 bottle with 2 tall glasses of water, repeat in 4 hours if needed Will cause diarrhea and clear you out     Constipation, Adult Constipation is when a person has fewer than three bowel movements in a week, has difficulty having a bowel movement, or has stools (feces) that are dry, hard, or larger than normal. Constipation may be caused by an underlying condition. It may become worse with age  if a person takes certain medicines and does not take in enough fluids. Follow these instructions at home: Eating and drinking  Eat foods that have a lot of fiber, such as beans, whole grains, and fresh fruits and vegetables.  Limit foods that are low in fiber and high in fat and processed sugars, such as fried or sweet foods. These include french fries, hamburgers, cookies, candies, and soda.  Drink enough fluid to keep your urine pale yellow.   General instructions  Exercise regularly or as told by your health care provider. Try to do 150 minutes of moderate exercise each week.  Use the bathroom when you have the urge to go. Do not hold it in.  Take over-the-counter and prescription medicines only as told by your health care provider. This includes any fiber supplements.  During bowel movements: ? Practice deep breathing while relaxing the lower abdomen. ? Practice pelvic floor relaxation.  Watch your condition for any changes. Let your health care provider know about them.  Keep all follow-up visits as told by your health care provider. This is important. Contact a health care provider if:  You have pain that gets worse.  You  have a fever.  You do not have a bowel movement after 4 days.  You vomit.  You are not hungry or you lose weight.  You are bleeding from the opening between the buttocks (anus).  You have thin, pencil-like stools. Get help right away if:  You have a fever and your symptoms suddenly get worse.  You leak stool or have blood in your stool.  Your abdomen is bloated.  You have severe pain in your abdomen.  You feel dizzy or you faint. Summary  Constipation is when a person has fewer than three bowel movements in a week, has difficulty having a bowel movement, or has stools (feces) that are dry, hard, or larger than normal.  Eat foods that have a lot of fiber, such as beans, whole grains, and fresh fruits and vegetables.  Drink enough fluid  to keep your urine pale yellow.  Take over-the-counter and prescription medicines only as told by your health care provider. This includes any fiber supplements. This information is not intended to replace advice given to you by your health care provider. Make sure you discuss any questions you have with your health care provider. Document Revised: 04/04/2019 Document Reviewed: 04/04/2019 Elsevier Patient Education  [employer].

## 2020-06-25 ENCOUNTER — Other Ambulatory Visit: Payer: Self-pay | Admitting: Adult Health

## 2020-06-25 DIAGNOSIS — Z9114 Patient's other noncompliance with medication regimen: Secondary | ICD-10-CM

## 2020-06-25 LAB — LIPID PANEL
Cholesterol: 215 mg/dL — ABNORMAL HIGH (ref <200)
HDL: 28 mg/dL — ABNORMAL LOW (ref >=50)
LDL Cholesterol (Calc): 145 mg/dL (calc) — ABNORMAL HIGH
Non-HDL Cholesterol (Calc): 187 mg/dL (calc) — ABNORMAL HIGH (ref <130)
Total CHOL/HDL Ratio: 7.7 (calc) — ABNORMAL HIGH (ref <5.0)
Triglycerides: 276 mg/dL — ABNORMAL HIGH (ref <150)

## 2020-06-25 LAB — CBC WITH DIFFERENTIAL/PLATELET
Absolute Monocytes: 685 cells/uL (ref 200–950)
Basophils Absolute: 134 cells/uL (ref 0–200)
Basophils Relative: 0.8 %
Eosinophils Absolute: 384 cells/uL (ref 15–500)
Eosinophils Relative: 2.3 %
HCT: 40.9 % (ref 35.0–45.0)
Hemoglobin: 12.8 g/dL (ref 11.7–15.5)
Lymphs Abs: 5511 cells/uL — ABNORMAL HIGH (ref 850–3900)
MCH: 19.2 pg — ABNORMAL LOW (ref 27.0–33.0)
MCHC: 31.3 g/dL — ABNORMAL LOW (ref 32.0–36.0)
MCV: 61.2 fL — ABNORMAL LOW (ref 80.0–100.0)
MPV: 10.4 fL (ref 7.5–12.5)
Monocytes Relative: 4.1 %
Neutro Abs: 9987 cells/uL — ABNORMAL HIGH (ref 1500–7800)
Neutrophils Relative %: 59.8 %
Platelets: 468 10*3/uL — ABNORMAL HIGH (ref 140–400)
RBC: 6.68 10*6/uL — ABNORMAL HIGH (ref 3.80–5.10)
RDW: 17.7 % — ABNORMAL HIGH (ref 11.0–15.0)
Total Lymphocyte: 33 %
WBC: 16.7 10*3/uL — ABNORMAL HIGH (ref 3.8–10.8)

## 2020-06-25 LAB — CBC MORPHOLOGY

## 2020-06-25 LAB — URINALYSIS, ROUTINE W REFLEX MICROSCOPIC
Bacteria, UA: NONE SEEN /HPF
Bilirubin Urine: NEGATIVE
Hgb urine dipstick: NEGATIVE
Hyaline Cast: NONE SEEN /LPF
Ketones, ur: NEGATIVE
Leukocytes,Ua: NEGATIVE
Nitrite: NEGATIVE
RBC / HPF: NONE SEEN /HPF (ref 0–2)
Specific Gravity, Urine: 1.023 (ref 1.001–1.03)
Squamous Epithelial / HPF: NONE SEEN /HPF (ref <=5)
pH: 5.5 (ref 5.0–8.0)

## 2020-06-25 LAB — HEMOGLOBIN A1C
Hgb A1c MFr Bld: 9.5 % of total Hgb — ABNORMAL HIGH (ref <5.7)
Mean Plasma Glucose: 226 mg/dL
eAG (mmol/L): 12.5 mmol/L

## 2020-06-25 LAB — MICROALBUMIN / CREATININE URINE RATIO
Creatinine, Urine: 147 mg/dL (ref 20–275)
Microalb Creat Ratio: 24 mcg/mg creat (ref <30)
Microalb, Ur: 3.5 mg/dL

## 2020-06-25 LAB — MAGNESIUM: Magnesium: 2.3 mg/dL (ref 1.5–2.5)

## 2020-06-25 LAB — COMPLETE METABOLIC PANEL WITH GFR
AG Ratio: 1.6 (calc) (ref 1.0–2.5)
ALT: 14 U/L (ref 6–29)
AST: 15 U/L (ref 10–35)
Albumin: 4.4 g/dL (ref 3.6–5.1)
Alkaline phosphatase (APISO): 72 U/L (ref 37–153)
BUN: 12 mg/dL (ref 7–25)
CO2: 25 mmol/L (ref 20–32)
Calcium: 9.9 mg/dL (ref 8.6–10.4)
Chloride: 100 mmol/L (ref 98–110)
Creat: 0.79 mg/dL (ref 0.50–0.99)
GFR, Est African American: 90 mL/min/{1.73_m2} (ref >=60)
GFR, Est Non African American: 78 mL/min/{1.73_m2} (ref >=60)
Globulin: 2.8 g/dL (calc) (ref 1.9–3.7)
Glucose, Bld: 172 mg/dL — ABNORMAL HIGH (ref 65–99)
Potassium: 4.8 mmol/L (ref 3.5–5.3)
Sodium: 135 mmol/L (ref 135–146)
Total Bilirubin: 0.6 mg/dL (ref 0.2–1.2)
Total Protein: 7.2 g/dL (ref 6.1–8.1)

## 2020-06-25 LAB — TSH: TSH: 3.11 mIU/L (ref 0.40–4.50)

## 2020-06-26 ENCOUNTER — Other Ambulatory Visit: Payer: 59

## 2020-07-01 ENCOUNTER — Ambulatory Visit
Admission: RE | Admit: 2020-07-01 | Discharge: 2020-07-01 | Disposition: A | Discharge status: Home / Self Care | Payer: Medicare Other | Source: Ambulatory Visit | Attending: Adult Health | Admitting: Adult Health

## 2020-07-01 DIAGNOSIS — R140 Abdominal distension (gaseous): Secondary | ICD-10-CM

## 2020-07-01 DIAGNOSIS — R1084 Generalized abdominal pain: Secondary | ICD-10-CM

## 2020-07-01 MED ORDER — IOPAMIDOL (ISOVUE-300) INJECTION 61%
100.0000 mL | Freq: Once | INTRAVENOUS | Status: AC | PRN
  Administered 2020-07-01: 100 mL via INTRAVENOUS

## 2020-07-02 ENCOUNTER — Other Ambulatory Visit: Payer: Self-pay | Admitting: Adult Health

## 2020-07-02 ENCOUNTER — Encounter: Payer: Self-pay | Admitting: Adult Health

## 2020-07-02 DIAGNOSIS — I700 Atherosclerosis of aorta: Secondary | ICD-10-CM

## 2020-07-02 DIAGNOSIS — I059 Rheumatic mitral valve disease, unspecified: Secondary | ICD-10-CM

## 2020-07-02 DIAGNOSIS — G4700 Insomnia, unspecified: Secondary | ICD-10-CM

## 2020-07-02 DIAGNOSIS — I3481 Nonrheumatic mitral (valve) annulus calcification: Secondary | ICD-10-CM

## 2020-07-02 DIAGNOSIS — I444 Left anterior fascicular block: Secondary | ICD-10-CM

## 2020-07-02 DIAGNOSIS — D3502 Benign neoplasm of left adrenal gland: Secondary | ICD-10-CM

## 2020-07-02 DIAGNOSIS — I2510 Atherosclerotic heart disease of native coronary artery without angina pectoris: Secondary | ICD-10-CM

## 2020-07-02 MED ORDER — LINACLOTIDE 72 MCG PO CAPS: 72.0000 ug | ORAL_CAPSULE | Freq: Every day | ORAL | 1 refills | Status: DC

## 2020-07-02 MED ORDER — ASPIRIN EC 81 MG PO TBEC: 81.0000 mg | DELAYED_RELEASE_TABLET | Freq: Every day | ORAL | 3 refills | Status: AC

## 2020-07-25 ENCOUNTER — Other Ambulatory Visit: Payer: Self-pay | Admitting: Adult Health

## 2020-07-25 DIAGNOSIS — F3341 Major depressive disorder, recurrent, in partial remission: Secondary | ICD-10-CM

## 2020-07-25 DIAGNOSIS — G4700 Insomnia, unspecified: Secondary | ICD-10-CM

## 2020-08-05 ENCOUNTER — Ambulatory Visit: Payer: Medicare Other | Admitting: Cardiovascular Disease

## 2020-08-14 ENCOUNTER — Other Ambulatory Visit: Payer: Self-pay | Admitting: Adult Health

## 2020-08-14 DIAGNOSIS — F3341 Major depressive disorder, recurrent, in partial remission: Secondary | ICD-10-CM

## 2020-08-18 ENCOUNTER — Other Ambulatory Visit: Payer: Self-pay | Admitting: Adult Health

## 2020-08-18 DIAGNOSIS — F3341 Major depressive disorder, recurrent, in partial remission: Secondary | ICD-10-CM

## 2020-08-19 ENCOUNTER — Other Ambulatory Visit: Payer: Self-pay | Admitting: Adult Health

## 2020-08-19 DIAGNOSIS — F3341 Major depressive disorder, recurrent, in partial remission: Secondary | ICD-10-CM

## 2020-08-19 MED ORDER — QUETIAPINE FUMARATE 25 MG PO TABS: ORAL_TABLET | ORAL | 0 refills | Status: DC

## 2020-09-01 LAB — HM COLONOSCOPY

## 2020-09-23 ENCOUNTER — Ambulatory Visit: Payer: Medicare Other | Admitting: Adult Health

## 2020-10-02 ENCOUNTER — Other Ambulatory Visit: Payer: Self-pay | Admitting: Adult Health

## 2020-10-08 ENCOUNTER — Other Ambulatory Visit: Payer: Self-pay | Admitting: Adult Health

## 2020-10-08 MED ORDER — PROMETHAZINE HCL 25 MG PO TABS: 25.0000 mg | ORAL_TABLET | Freq: Four times a day (QID) | ORAL | 3 refills | Status: DC | PRN

## 2020-10-08 MED ORDER — FENOFIBRATE 145 MG PO TABS
160.0000 mg | ORAL_TABLET | Freq: Every day | ORAL | 1 refills | Status: DC
Start: 2020-10-08 — End: 2020-10-08

## 2020-10-27 ENCOUNTER — Other Ambulatory Visit: Payer: Self-pay | Admitting: Adult Health

## 2020-10-27 DIAGNOSIS — F3341 Major depressive disorder, recurrent, in partial remission: Secondary | ICD-10-CM

## 2020-10-27 DIAGNOSIS — G4700 Insomnia, unspecified: Secondary | ICD-10-CM

## 2020-12-11 ENCOUNTER — Other Ambulatory Visit: Payer: Self-pay | Admitting: Adult Health

## 2020-12-11 DIAGNOSIS — F3341 Major depressive disorder, recurrent, in partial remission: Secondary | ICD-10-CM

## 2020-12-24 ENCOUNTER — Other Ambulatory Visit: Payer: Self-pay

## 2020-12-24 ENCOUNTER — Ambulatory Visit (INDEPENDENT_AMBULATORY_CARE_PROVIDER_SITE_OTHER): Payer: Medicare Other | Admitting: Internal Medicine

## 2020-12-24 ENCOUNTER — Encounter: Payer: Self-pay | Admitting: Internal Medicine

## 2020-12-24 VITALS — BP 122/62 | HR 95 | Temp 97.8°F | Resp 16 | Ht 62.5 in | Wt 159.0 lb

## 2020-12-24 DIAGNOSIS — E559 Vitamin D deficiency, unspecified: Secondary | ICD-10-CM | POA: Diagnosis not present

## 2020-12-24 DIAGNOSIS — N182 Chronic kidney disease, stage 2 (mild): Secondary | ICD-10-CM

## 2020-12-24 DIAGNOSIS — R030 Elevated blood-pressure reading, without diagnosis of hypertension: Secondary | ICD-10-CM

## 2020-12-24 DIAGNOSIS — Z794 Long term (current) use of insulin: Secondary | ICD-10-CM

## 2020-12-24 DIAGNOSIS — E1122 Type 2 diabetes mellitus with diabetic chronic kidney disease: Secondary | ICD-10-CM | POA: Diagnosis not present

## 2020-12-24 DIAGNOSIS — Z79899 Other long term (current) drug therapy: Secondary | ICD-10-CM

## 2020-12-24 DIAGNOSIS — E785 Hyperlipidemia, unspecified: Secondary | ICD-10-CM

## 2020-12-24 DIAGNOSIS — I2510 Atherosclerotic heart disease of native coronary artery without angina pectoris: Secondary | ICD-10-CM

## 2020-12-24 DIAGNOSIS — E1169 Type 2 diabetes mellitus with other specified complication: Secondary | ICD-10-CM | POA: Diagnosis not present

## 2020-12-24 DIAGNOSIS — D471 Chronic myeloproliferative disease: Secondary | ICD-10-CM

## 2020-12-24 NOTE — Patient Instructions (Signed)

## 2020-12-24 NOTE — Progress Notes (Signed)
Future Appointments  Date Time Provider [place]  12/24/2020  4:00 PM Unk Pinto, MD [facility] None  06/23/2021  - CPE   2:00 PM Liane Comber, NP [facility] None    History of Present Illness:       This very nice but poorly compliant 65 y.o. MWF presents for 6 month follow up with HTN, HLD, T2_NIDDM and Vitamin D Deficiency. In 2010, patient had a sigmoid Colon Cancer resected and underwent ChemoTx  by Dr Marin Olp and is on active surveillance.                                                    Review of patient's CBC's for several years finds elevated RBC's, WBC's & platelets suspect for a myeloproliferative disorder as PRV.  She had a bone marrow done in 2005 - ->>17 years ago for similar concerns.       Patient is monitored expectantly for elevated BP & BP has been controlled at home. Today's BP is at goal - 122/62. Patient has had no complaints of any cardiac type chest pain, palpitations, dyspnea / orthopnea / PND, dizziness, claudication, or dependent edema.       Hyperlipidemia is not controlled with diet & alleged statin intolerance and poor compliance  with her meds. Patient denies myalgias or other med SE's. Last Lipids were not at goal:  Lab Results  Component Value Date   CHOL 215 (H) 06/24/2020   HDL 28 (L) 06/24/2020   LDLCALC 145 (H) 06/24/2020   TRIG 276 (H) 06/24/2020   CHOLHDL 7.7 (H) 06/24/2020    Also, the patient has history of poorly controlled T2_NIDDM  (Jan 2012) w/CKD2 (GFR 78). Patient is overweight with a BMI 29+ and has been poorly compliant with diet and recommended therapies over the years.  Patient has had no symptoms of reactive hypoglycemia, diabetic polys, paresthesias or visual blurring.  Last A1c was not at goal:   Lab Results  Component Value Date   HGBA1C 9.5 (H) 06/24/2020                                                    Further, the patient also has history of Vitamin D Deficiency  ("33" /2013) and apparently  does not supplement vitamin D as previously recommended.  Last vitamin D was still not at goal:  Lab Results  Component Value Date   VD25OH 15 (L) 03/13/2020     Current Outpatient Medications on File Prior to Visit  Medication Sig   fenofibrate (TRICOR) 145 MG tablet Take 1 tablet by mouth once daily   ALPRAZolam (XANAX) 0.5 MG tablet TAKE 1/2 TO 1 (ONE-HALF TO ONE) TABLET BY MOUTH AT BEDTIME AS NEEDED FOR ANXIETY OR SLEEP   aspirin EC 81 MG tablet Take 1 tablet (81 mg total) by mouth daily. Swallow whole.   CHOLECALCIFEROL PO Take 5,000 Units by mouth daily.   citalopram (CELEXA) 20 MG tablet Take 1 tablet by mouth once daily   glimepiride (AMARYL) 4 MG tablet Take 1 tablet (4 mg total) by mouth 2 (two) times daily with a meal.   linaclotide (LINZESS) 72 MCG capsule  Take 1 capsule (72 mcg total) by mouth daily before breakfast.   nystatin cream (MYCOSTATIN) Apply 1 application topically 2 (two) times daily.   promethazine (PHENERGAN) 25 MG tablet Take 1 tablet (25 mg total) by mouth every 6 (six) hours as needed for nausea or vomiting.   QUEtiapine (SEROQUEL) 25 MG tablet Take 1 tablet at Bedtime as needed for Sleep   rosuvastatin (CRESTOR) 40 MG tablet Take 1 tablet (40 mg total) by mouth daily.   triamcinolone ointment (KENALOG) 0.1 % Apply 1 application topically 2 (two) times daily.    Allergies  Allergen Reactions   Codeine    Glipizide Nausea And Vomiting   Lunesta [Eszopiclone]     Excessive fatigue   Metformin And Related     Nausea and abdominal pain   Welchol [Colesevelam Hcl]     nausea     PMHx:   Past Medical History:  Diagnosis Date   Anxiety    Cervical dysplasia    Colon cancer ([name])    Depression    Diabetes ([place])    History of colon cancer, stage II 09/18/2012   Hyperlipidemia    Right-sided Bell's palsy 01/03/2019   Vitamin D deficiency     Immunization History  Administered Date(s) Administered   Pneumococcal Polysaccharide-23 01/07/2020     Past Surgical History:  Procedure Laterality Date   APPENDECTOMY     COLONOSCOPY     LAPAROSCOPIC CHOLECYSTECTOMY  2010   PARTIAL COLECTOMY  2010   Colon cancer resection, Dr. Rise Patience    FHx:    Reviewed / unchanged  SHx:    Reviewed / unchanged   Systems Review:  Constitutional: Denies fever, chills, wt changes, headaches, insomnia, fatigue, night sweats, change in appetite. Eyes: Denies redness, blurred vision, diplopia, discharge, itchy, watery eyes.  ENT: Denies discharge, congestion, post nasal drip, epistaxis, sore throat, earache, hearing loss, dental pain, tinnitus, vertigo, sinus pain, snoring.  CV: Denies chest pain, palpitations, irregular heartbeat, syncope, dyspnea, diaphoresis, orthopnea, PND, claudication or edema. Respiratory: denies cough, dyspnea, DOE, pleurisy, hoarseness, laryngitis, wheezing.  Gastrointestinal: Denies dysphagia, odynophagia, heartburn, reflux, water brash, abdominal pain or cramps, nausea, vomiting, bloating, diarrhea, constipation, hematemesis, melena, hematochezia  or hemorrhoids. Genitourinary: Denies dysuria, frequency, urgency, nocturia, hesitancy, discharge, hematuria or flank pain. Musculoskeletal: Denies arthralgias, myalgias, stiffness, jt. swelling, pain, limping or strain/sprain.  Skin: Denies pruritus, rash, hives, warts, acne, eczema or change in skin lesion(s). Neuro: No weakness, tremor, incoordination, spasms, paresthesia or pain. Psychiatric: Denies confusion, memory loss or sensory loss. Endo: Denies change in weight, skin or hair change.  Heme/Lymph: No excessive bleeding, bruising or enlarged lymph nodes.  Physical Exam  BP 122/62   Pulse 95   Temp 97.8 F (36.6 C)   Resp 16   Ht 5' 2.5" (1.588 m)   Wt 159 lb (72.1 kg)   SpO2 96%   BMI 28.62 kg/m   Appears  over nourished and in no distress.  Eyes: PERRLA, EOMs, conjunctiva no swelling or erythema. Sinuses: No frontal/maxillary tenderness ENT/Mouth: EAC's  clear, TM's nl w/o erythema, bulging. Nares clear w/o erythema, swelling, exudates. Oropharynx clear without erythema or exudates. Oral hygiene is good. Tongue normal, non obstructing. Hearing intact.  Neck: Supple. Thyroid not palpable. Car 2+/2+ without bruits, nodes or JVD. Chest: Respirations nl with BS clear & equal w/o rales, rhonchi, wheezing or stridor.  Cor: Heart sounds normal w/ regular rate and rhythm without sig. murmurs, gallops, clicks or rubs. Peripheral pulses normal and equal  without edema.  Abdomen: Soft & bowel sounds normal. Non-tender w/o guarding, rebound, hernias, masses or organomegaly.  Lymphatics: Unremarkable.  Musculoskeletal: Full ROM all peripheral extremities, joint stability, 5/5 strength and normal gait.  Skin: Warm, dry without exposed rashes, lesions or ecchymosis apparent.  Neuro: Cranial nerves intact, reflexes equal bilaterally. Sensory-motor testing grossly intact. Tendon reflexes grossly intact.  Pysch: Alert & oriented x 3.  Insight and judgement nl & appropriate. No ideations.  Assessment and Plan:  1. Elevated BP without diagnosis of hypertension  - Continue medication, monitor blood pressure at home.  - Continue DASH diet.  Reminder to go to the ER if any CP,  SOB, nausea, dizziness, severe HA, changes vision/speech.   - CBC with Differential/Platelet - COMPLETE METABOLIC PANEL WITH GFR - Magnesium - TSH  2. Hyperlipidemia associated with type 2 diabetes mellitus ([place])  - Continue diet/meds, exercise,& lifestyle modifications.  - Continue monitor periodic cholesterol/liver & renal functions    - Lipid panel - TSH  3. Type 2 diabetes mellitus with stage 2 chronic kidney  disease, with long-term current use of insulin ([name])  - Continue diet, exercise  - Lifestyle modifications.  - Monitor appropriate labs    - Hemoglobin A1c - Insulin, random  4. Vitamin D deficiency  - Continue supplementation     - VITAMIN D 25 Hydroxy   5.  Myeloproliferative disorder [facility])  - Anticipate referral back to Dr Alen Blew when labs resulted   6. Medication management  - CBC with Differential/Platelet - COMPLETE METABOLIC PANEL WITH GFR - Magnesium - Lipid panel - TSH - Hemoglobin A1c - Insulin, random - VITAMIN D 25 Hydroxy          Discussed  regular exercise, BP monitoring, weight control to achieve/maintain BMI less than 25 and discussed med and SE's. Recommended labs to assess and monitor clinical status with further disposition pending results of labs.  I discussed the assessment and treatment plan with the patient. The patient was provided an opportunity to ask questions and all were answered. The patient agreed with the plan and demonstrated an understanding of the instructions.  I provided over 30 minutes of exam, counseling, chart review and  complex critical decision making.        The patient was advised to call back or seek an in-person evaluation if the symptoms worsen or if the condition fails to improve as anticipated.   Kirtland Bouchard, MD

## 2020-12-25 ENCOUNTER — Other Ambulatory Visit: Payer: Self-pay | Admitting: Internal Medicine

## 2020-12-25 DIAGNOSIS — D45 Polycythemia vera: Secondary | ICD-10-CM

## 2020-12-25 DIAGNOSIS — C9000 Multiple myeloma not having achieved remission: Secondary | ICD-10-CM

## 2020-12-25 LAB — LIPID PANEL
Cholesterol: 174 mg/dL (ref <200)
HDL: 29 mg/dL — ABNORMAL LOW (ref >=50)
LDL Cholesterol (Calc): 110 mg/dL (calc) — ABNORMAL HIGH
Non-HDL Cholesterol (Calc): 145 mg/dL (calc) — ABNORMAL HIGH (ref <130)
Total CHOL/HDL Ratio: 6 (calc) — ABNORMAL HIGH (ref <5.0)
Triglycerides: 232 mg/dL — ABNORMAL HIGH (ref <150)

## 2020-12-25 LAB — COMPLETE METABOLIC PANEL WITH GFR
AG Ratio: 1.4 (calc) (ref 1.0–2.5)
ALT: 11 U/L (ref 6–29)
AST: 12 U/L (ref 10–35)
Albumin: 4.2 g/dL (ref 3.6–5.1)
Alkaline phosphatase (APISO): 56 U/L (ref 37–153)
BUN: 19 mg/dL (ref 7–25)
CO2: 25 mmol/L (ref 20–32)
Calcium: 9.6 mg/dL (ref 8.6–10.4)
Chloride: 100 mmol/L (ref 98–110)
Creat: 0.93 mg/dL (ref 0.50–1.05)
Globulin: 2.9 g/dL (calc) (ref 1.9–3.7)
Glucose, Bld: 290 mg/dL — ABNORMAL HIGH (ref 65–99)
Potassium: 4.8 mmol/L (ref 3.5–5.3)
Sodium: 135 mmol/L (ref 135–146)
Total Bilirubin: 0.4 mg/dL (ref 0.2–1.2)
Total Protein: 7.1 g/dL (ref 6.1–8.1)
eGFR: 68 mL/min/{1.73_m2} (ref >=60)

## 2020-12-25 LAB — CBC WITH DIFFERENTIAL/PLATELET
Absolute Monocytes: 615 cells/uL (ref 200–950)
Basophils Absolute: 120 cells/uL (ref 0–200)
Basophils Relative: 0.8 %
Eosinophils Absolute: 300 cells/uL (ref 15–500)
Eosinophils Relative: 2 %
HCT: 39.5 % (ref 35.0–45.0)
Hemoglobin: 12.2 g/dL (ref 11.7–15.5)
Lymphs Abs: 4965 cells/uL — ABNORMAL HIGH (ref 850–3900)
MCH: 18.9 pg — ABNORMAL LOW (ref 27.0–33.0)
MCHC: 30.9 g/dL — ABNORMAL LOW (ref 32.0–36.0)
MCV: 61.2 fL — ABNORMAL LOW (ref 80.0–100.0)
MPV: 9.9 fL (ref 7.5–12.5)
Monocytes Relative: 4.1 %
Neutro Abs: 9000 cells/uL — ABNORMAL HIGH (ref 1500–7800)
Neutrophils Relative %: 60 %
Platelets: 448 10*3/uL — ABNORMAL HIGH (ref 140–400)
RBC: 6.45 10*6/uL — ABNORMAL HIGH (ref 3.80–5.10)
RDW: 17.6 % — ABNORMAL HIGH (ref 11.0–15.0)
Total Lymphocyte: 33.1 %
WBC: 15 10*3/uL — ABNORMAL HIGH (ref 3.8–10.8)

## 2020-12-25 LAB — HEMOGLOBIN A1C
Hgb A1c MFr Bld: 10 % of total Hgb — ABNORMAL HIGH (ref <5.7)
Mean Plasma Glucose: 240 mg/dL
eAG (mmol/L): 13.3 mmol/L

## 2020-12-25 LAB — INSULIN, RANDOM: Insulin: 31.2 u[IU]/mL — ABNORMAL HIGH

## 2020-12-25 LAB — VITAMIN D 25 HYDROXY (VIT D DEFICIENCY, FRACTURES): Vit D, 25-Hydroxy: 15 ng/mL — ABNORMAL LOW (ref 30–100)

## 2020-12-25 LAB — MAGNESIUM: Magnesium: 2 mg/dL (ref 1.5–2.5)

## 2020-12-25 LAB — TSH: TSH: 3.59 mIU/L (ref 0.40–4.50)

## 2020-12-25 NOTE — Progress Notes (Signed)
============================================================ - Test results slightly outside the reference range are not unusual. If there is anything important, I will review this with you,  otherwise it is considered normal test values.  If you have further questions,  please do not hesitate to contact me at the office or via My Chart.  ============================================================ ============================================================  -  CBC shows all bone marrow cell types                                          - Red cells  -  White cells   & Platelets are increased   - Requesting Referral back to Hematologist / Blood Specialist - Dr Alen Blew    To evaluate for a condition called Polycythemia Rubra Vera ============================================================ ============================================================  - Glucose = 290 mg% is to high - as is A1c= 10 % - way too high   - Recommend schedule office visit to discuss different                                                                   treatments to help lower blood sugars.  - in the meantime - recommend increase                                                            Glimepiride  up to 3 x /day with each meal   ============================================================ - Also diet is very Important Your blood sugar and A1c are elevated.    Being diabetic has a  300% increased risk for heart attack,  stroke, cancer, and alzheimer- type vascular dementia.   It is very important that you work harder with diet by  avoiding all foods that are white except chicken,   fish & calliflower.  - Avoid white rice  (brown & wild rice is OK),   - Avoid white potatoes  (sweet potatoes in moderation is OK),   White bread or wheat bread or anything made out of   white flour like bagels, donuts, rolls, buns, biscuits, cakes,  - pastries, cookies, pizza crust, and pasta (made from   white flour & egg whites)   - vegetarian pasta or spinach or wheat pasta is OK.  - Multigrain breads like Arnold's, Pepperidge Farm or   multigrain sandwich thins or high fiber breads like   [place] bread or "Dave's Killer" breads that are  4 to 5 grams fiber per slice !  are best.    Diet, exercise and weight loss can reverse and cure  diabetes in the early stages.    - Diet, exercise and weight loss is very important in the   control and prevention of complications of diabetes which  affects every system in your body, ie.   -Brain - dementia/stroke,  - eyes - glaucoma/blindness,  - heart - heart attack/heart failure,  - kidneys - dialysis,  - stomach - gastric paralysis,  - intestines - malabsorption,  - nerves - severe painful neuritis,  -  circulation - gangrene & loss of a leg(s)  - and finally  . . . . . . . . . . . . . . . . . .    - cancer and Alzheimers. ============================================================ ============================================================  - Vitamin D = 15 is extremely & dangerously LOW ! ! !   - Obviously you're not taking the recommended Vitamin  D 5,000 units /day   - Recommend Please start taking 10,000 units /daily  ============================================================ ============================================================  -

## 2020-12-25 NOTE — Progress Notes (Signed)
Please call results to patient

## 2020-12-31 ENCOUNTER — Telehealth: Payer: Self-pay | Admitting: Oncology

## 2020-12-31 NOTE — Telephone Encounter (Signed)
Scheduled appt per 8/3 referral. Pt aware.

## 2021-01-29 ENCOUNTER — Other Ambulatory Visit: Payer: Self-pay | Admitting: Adult Health

## 2021-02-18 ENCOUNTER — Inpatient Hospital Stay: Payer: Medicare Other | Attending: Oncology | Admitting: Oncology

## 2021-02-18 ENCOUNTER — Other Ambulatory Visit: Payer: Self-pay

## 2021-02-18 VITALS — BP 144/61 | HR 98 | Temp 97.9°F | Resp 17 | Ht 62.5 in | Wt 163.6 lb

## 2021-02-18 DIAGNOSIS — Z7984 Long term (current) use of oral hypoglycemic drugs: Secondary | ICD-10-CM | POA: Diagnosis not present

## 2021-02-18 DIAGNOSIS — D75839 Thrombocytosis, unspecified: Secondary | ICD-10-CM | POA: Insufficient documentation

## 2021-02-18 DIAGNOSIS — Z7982 Long term (current) use of aspirin: Secondary | ICD-10-CM | POA: Insufficient documentation

## 2021-02-18 DIAGNOSIS — Z79899 Other long term (current) drug therapy: Secondary | ICD-10-CM | POA: Diagnosis not present

## 2021-02-18 DIAGNOSIS — Z85048 Personal history of other malignant neoplasm of rectum, rectosigmoid junction, and anus: Secondary | ICD-10-CM | POA: Diagnosis not present

## 2021-02-18 DIAGNOSIS — D72829 Elevated white blood cell count, unspecified: Secondary | ICD-10-CM

## 2021-02-18 DIAGNOSIS — F17210 Nicotine dependence, cigarettes, uncomplicated: Secondary | ICD-10-CM | POA: Diagnosis not present

## 2021-02-18 DIAGNOSIS — R718 Other abnormality of red blood cells: Secondary | ICD-10-CM | POA: Diagnosis not present

## 2021-02-18 DIAGNOSIS — C189 Malignant neoplasm of colon, unspecified: Secondary | ICD-10-CM | POA: Diagnosis not present

## 2021-02-18 NOTE — Progress Notes (Signed)
Hematology and Oncology Follow Up Visit  Dorothy Boyd [id number] 1955-11-13 65 y.o. 02/18/2021 3:20 PM Dorothy Boyd, MDMcKeown, Dorothy Saxon, MD   Principle Diagnosis: 65 year old with:  1.  Stage II (T3, N0, M0) adenocarcinoma of the sigmoid colon diagnosed in 2010. 2.  Leukocytosis, thrombocytosis and microcytosis dating back to 2005.  Her work-up has been unrevealing for a myeloproliferative disorder including a bone marrow biopsy.  JAK2 mutation analysis was negative in 2019.  Prior Therapy:  She is status post a bone marrow biopsy contained in 2005 which showed no evidence of myeloproliferative disorder. She is status post a rectosigmoid resection which showed invasive moderately differentiated adenocarcinoma spanning 6 cm. 0/16 lymph nodes sampled none had  any evidence of cancer. This was done in August of 2010. She was treated with adjuvant FOLFOX chemotherapy therapy completed in 2011 under the care of Dr. Marin Olp.    Current therapy: Active surveillance.  Interim History: Dorothy Boyd presents today for repeat evaluation.  Since her last visit, she reports no major changes in her health.  She denies any recent hospitalizations or illnesses.  She denies any shortness of breath or difficulty breathing.  She continues to have issues with anxiety although manageable.  She still smoking heavily at least half a pack a day.   Medications: Reviewed without changes. Current Outpatient Medications  Medication Sig Dispense Refill   fenofibrate (TRICOR) 145 MG tablet Take 1 tablet by mouth once daily 90 tablet 1   ALPRAZolam (XANAX) 0.5 MG tablet TAKE 1/2 TO 1 (ONE-HALF TO ONE) TABLET BY MOUTH AT BEDTIME AS NEEDED FOR ANXIETY OR SLEEP 30 tablet 0   aspirin EC 81 MG tablet Take 1 tablet (81 mg total) by mouth daily. Swallow whole. 90 tablet 3   CHOLECALCIFEROL PO Take 5,000 Units by mouth daily.     citalopram (CELEXA) 20 MG tablet Take 1 tablet by mouth once daily 90 tablet 3   glimepiride  (AMARYL) 4 MG tablet TAKE 1 TABLET BY MOUTH TWICE DAILY WITH A MEAL 180 tablet 0   nystatin cream (MYCOSTATIN) Apply 1 application topically 2 (two) times daily. 30 g 1   promethazine (PHENERGAN) 25 MG tablet Take 1 tablet (25 mg total) by mouth every 6 (six) hours as needed for nausea or vomiting. 30 tablet 3   QUEtiapine (SEROQUEL) 25 MG tablet Take 1 tablet at Bedtime as needed for Sleep 90 tablet 1   rosuvastatin (CRESTOR) 40 MG tablet Take 1 tablet (40 mg total) by mouth daily. 90 tablet 1   triamcinolone ointment (KENALOG) 0.1 % Apply 1 application topically 2 (two) times daily. 80 g 1   No current facility-administered medications for this visit.     Allergies:  Allergies  Allergen Reactions   Codeine    Glipizide Nausea And Vomiting   Lunesta [Eszopiclone]     Excessive fatigue   Metformin And Related     Nausea and abdominal pain   Welchol [Colesevelam Hcl]     nausea        Physical Exam: Blood pressure (!) 144/61, pulse 98, temperature 97.9 F (36.6 C), temperature source Oral, resp. rate 17, height 5' 2.5" (1.588 m), weight 163 lb 9.6 oz (74.2 kg), SpO2 98 %.  ECOG: 1  General appearance: Alert, awake without any distress. Head: Atraumatic without abnormalities Oropharynx: Without any thrush or ulcers. Eyes: No scleral icterus. Lymph nodes: No lymphadenopathy noted in the cervical, supraclavicular, or axillary nodes Heart:regular rate and rhythm, without any murmurs or gallops.  Lung: Clear to auscultation without any rhonchi, wheezes or dullness to percussion. Abdomin: Soft, nontender without any shifting dullness or ascites. Musculoskeletal: No clubbing or cyanosis. Neurological: No motor or sensory deficits. Skin: No rashes or lesions.     Lab Results: Lab Results  Component Value Date   WBC 15.0 (H) 12/24/2020   HGB 12.2 12/24/2020   HCT 39.5 12/24/2020   MCV 61.2 (L) 12/24/2020   PLT 448 (H) 12/24/2020     Chemistry      Component Value  Date/Time   NA 135 12/24/2020 1556   NA 136 09/21/2013 1315   K 4.8 12/24/2020 1556   K 3.9 09/21/2013 1315   CL 100 12/24/2020 1556   CO2 25 12/24/2020 1556   CO2 22 09/21/2013 1315   BUN 19 12/24/2020 1556   BUN 16.3 09/21/2013 1315   CREATININE 0.93 12/24/2020 1556   CREATININE 0.8 09/21/2013 1315      Component Value Date/Time   CALCIUM 9.6 12/24/2020 1556   CALCIUM 9.9 09/21/2013 1315   ALKPHOS 96 12/15/2017 1207   ALKPHOS 96 09/21/2013 1315   AST 12 12/24/2020 1556   AST 15 12/15/2017 1207   AST 13 09/21/2013 1315   ALT 11 12/24/2020 1556   ALT 17 12/15/2017 1207   ALT 14 09/21/2013 1315   BILITOT 0.4 12/24/2020 1556   BILITOT 0.5 12/15/2017 1207   BILITOT 0.48 09/21/2013 1315       Impression and Plan:  65 year old woman with:  1. Leukocytosis and thrombocytosis noted in 2005.    Laboratory data in the last 3 years were personally reviewed and discussed with the patient.  Her white cell count has ranged between 13 and 18 with normal differential.  These counts are consistent with her previous baseline and her work-up has been unrevealing.  She underwent a bone marrow biopsy in the past as well as JAK2 mutation analysis for myeloproliferative disorder.  Her testing has been negative on few occasions.  Based on the her recent labs with CBC in July 2022, I see no deviation from her baseline.  Her white cell count is 15,000 with hemoglobin of 12 and a platelet count of 448.  These findings are likely more consistent with reactive elevation rather than a myeloproliferative disorder.  Smoking could be a contributing factor as well as anxiety.  Given the chronicity of these findings, I do not recommend any further hematological work-up at this time.   2.  Microcytosis: This is likely related to thalassemia rather than iron deficiency.  Iron studies in the past were normal with normal hemoglobin at this time.    3.  Colon cancer diagnosed in 2010.  She was found to  have stage II disease without any evidence of relapse.   4.  Smoking cessation: This was discussed today with her and emphasized importance of that.  Notably because of her leukocytosis but for other health issues.  5. Followup: I am happy to see her in the future as needed if her counts change from baseline.  30  minutes were dedicated to this encounter.  Time was spent on reviewing laboratory data, discussing differential diagnosis, management choices and future plan of care review.     Zola Button, MD 9/21/20223:20 PM

## 2021-04-01 NOTE — Progress Notes (Signed)
AWV AND FOLLOW UP  Assessment:    Dorothy Boyd was seen today for follow-up and wellness.  Diagnoses and all orders for this visit:  Annual Medicare Wellness Visit Due annually  Health maintenance reviewed  Declines vaccines  Declines PAP/pelvic exam  Given phone number to schedule mammogram  Reminded to schedule diabetes eye exam and have report forwarded - she has phone number to schedule  Aortic atherosclerosis ([place]) Per CT 2019 Control blood pressure, cholesterol, glucose, increase exercise.  -     Lipid panel  3 Vessel CAD STOP SMOKING Denies angina Control blood pressure, cholesterol, glucose, increase exercise.  Recommend bASA, restart try every other day Cardiology referral placed-   Mitral valve annular calcificiation Denies concerning sx; stop smoking, control cholesterol and sugars Recommended cardiology evaluation; -referral placed  Malignant neoplasm of colon, unspecified part of colon ([place]) Hx of, s/p resection, has seen [name], will request report   Hyperlipidemia associated with type 2 diabetes mellitus ([place]) Complicated by poor compliance and med SE Reports tolerating rosuvastatin 40 mg daily LDL goal <70 Continue low cholesterol diet and exercise.  Check lipid panel.  -     Lipid panel -     TSH  Type 2 diabetes mellitus with stage 1 chronic kidney disease, with long-term current use of insulin ([name]) Type 2 Diabetes Mellitus - poorly controlled Complicated by non-compliance, med intolerance Intolerance: metformin, trulicity, TIWP8K (numerous) ABSOLUTELY DECLINES INSULIN OR CHECKING GLUCOSE - fear of needles Maximized on glimepiride  Today interested in GLP1ra, weekly, can have husband give SE reviewed, trulicity 9.98 mg dose x 4 given  Lifestyle emphasized  Education: Reviewed 'ABCs' of diabetes management (respective goals in parentheses):  A1C (<7), blood pressure (<130/80), and cholesterol (LDL <70) Eye Exam yearly and Dental Exam every 6  months.- eye report requested Dietary recommendations Physical Activity recommendations -     COMPLETE METABOLIC PANEL WITH GFR -     Hemoglobin A1c  CKD stage 2 due to type 2 diabetes mellitus ([name]) Increase fluids, avoid NSAIDS, monitor sugars, will monitor  Depression, major, recurrent, in partial remission ([name]) Celexa 20 mg daily, seroquel Follow up sooner if SE or other concerns, continue seroquel Lifestyle discussed: diet/exerise, sleep hygiene, stress management, hydration  Vitamin D deficiency Encouraged taking supplement consistently, check levels next visit   Smoker Discussed risks associated with tobacco use and advised to reduce or quit Patient is not ready to do so, but advised to consider strongly Will follow up at the next visit  -lung cancer screening with low dose CT discussed as recommended by guidelines based on age, number of pack year history.  Discussed risks of screening including but not limited to false positives on xray, further testing or consultation with specialist, and possible false negative CT as well. Understanding expressed and wishes to proceed with CT testing. Continue annually, due in 11/2020 - order placed   Overweight (BMI 25.0-29.9) Continue to recommend diet heavy in fruits and veggies and low in animal meats, cheeses, and dairy products, appropriate calorie intake Discuss exercise recommendations routinely Continue to monitor weight at each visit  Leukocytosis, unspecified type Has recently seen [name]; monitoring q89m here -     CBC with Differential/Platelet  Arthralgia of both feet Continue diclofenac gel PRN  Anxiety Continue celexa Stress management techniques discussed, increase water, good sleep hygiene discussed, increase exercise, and increase veggies.   Poor compliance Continue to discuss barriers, encourage compliance  Osteoporosis screening Order in system; patient declines at this time  Constipation Continue  miralax with plenty of water Try adding sennkot "Align" probiotic with prebiotic/fiber If needed (3+ days no movement) mag citrate 1/2 bottle with 2 glasses of water, repeat in 4 hours if needed Discussed needs to have BM at least every other day  Orders Placed This Encounter  Procedures   CT CHEST LUNG CA SCREEN LOW DOSE W/O CM   MM Digital Screening   CBC with Differential/Platelet   COMPLETE METABOLIC PANEL WITH GFR   Magnesium   Lipid panel   TSH   Hemoglobin A1c   Ambulatory referral to Cardiology     Over 40 minutes of exam, counseling, chart review and critical decision making was performed Future Appointments  Date Time Provider [place]  06/23/2021  2:00 PM Liane Comber, NP [facility] None  04/06/2022  4:00 PM Liane Comber, NP [facility] None     Plan:   During the course of the visit the patient was educated and counseled about appropriate screening and preventive services including:   Pneumococcal vaccine  Prevnar 13 Influenza vaccine Td vaccine Screening electrocardiogram Bone densitometry screening Colorectal cancer screening Diabetes screening Glaucoma screening Nutrition counseling  Advanced directives: requested   Subjective:  Dorothy Boyd is a 65 y.o. female who presents for AWV and 3 month follow up. She has Leukocytosis; Hyperlipidemia associated with type 2 diabetes mellitus ([place]); Diabetes ([place]); Colon cancer ([place]); Vitamin D deficiency; Anxiety; Overweight (BMI 25.0-29.9); Arthralgia of both feet; Depression, major, recurrent, in partial remission ([place]); Smoker; CKD stage 2 due to type 2 diabetes mellitus ([place]); Aortic atherosclerosis ([place]); Poor compliance; Mitral valve annular calcification; LAFB (left anterior fascicular block); 3-vessel CAD (per CT chest 11/2019); Constipation; Medication nonadherence due to intolerance; Calcification of abdominal aorta ([name]); and Adrenal adenoma, left on their problem list.  She is married, 2  children, 5 grandchildren. She is retired Medical sales representative work.   She has hx of colon cancer in 2010, s/p resection and established with [name]; she has had unexplained leukocytosis and microcytosis documented since 2010.  Etiology remains unclear with possible myeloproliferative disorder and was referred back to Oncology for evaluation and was felt to be stable in 02/18/2021 by [name], was recommended monitoring and follow up only if trending up.   She is recommended colonoscopy follow up for colon cancer screening, she went to [place] earlier this year, reports had colonoscopy early 2022 but not full prep, did remove some polyps, was recommended 1 year follow up.  Pending receipt of report.   she has a diagnosis of depression and is currently on celexa 20 mg daily daily for mood and doing well. Also on seroquel and finds this helpful for sleep, failed trazodone and gabeptnin. She has been on xanax, takes 0.25 mg as needed for sleep a few days a week.   She continues to smoke, has cut back to <0.75 pack a day, but has 30+ year smoking history, since 1978. Denies cough, dyspnea. She had CT lung cancer screening 12/2019 without concerning nodule but suggestive of small airway disease.   BMI is Body mass index is 29.34 kg/m., she has not been working on diet and exercise. she has been working on diet, she is walking doing stairs a few days a week, 10-15 min.  Feet aching and bother her, saw podiatry and advised arthritis and taking tylenol with some benefit though limits time on her feet, wearing copper foot bands with some perceived benefit. She is still drinking a some diet soda daily (3  x 12 oz), admits minimal water.  She admits eating irregular, some times only 1 meal a day, typically has sandwich with multigrain bread, crackers, etc, and will eat fruit. Very poorly motivated to work on lifestyle. Wt Readings from Last 3 Encounters:  04/02/21 163 lb (73.9 kg)  02/18/21 163 lb 9.6 oz (74.2  kg)  12/24/20 159 lb (72.1 kg)   Has aortic atherosclerosis and 3 vessel CAD per CT 11/2017, 11/2019.  CT chest showed dilated pulm arteries; underwent ECHO 03/06/2020 which showed normal pulmonary artery pressure, no atypical dilation, no hypertrophy. Did incidentally note mitral valve has moderate to severe calcification (hardening) - no regurgitation or noted stenosis. Also known 3 vessel CAD per CT chest. She was referred to cardiology early 2022 but had to postpone, receptive to referral back today.  She does have BP cuff, 120s/70s at home is BP: 118/68 She does workout. She denies chest pain, shortness of breath, dizziness.   She is on cholesterol medication (rosuvastatin 40 mg daily and fenofibrate) and denies myalgias. Her cholesterol is not at goal. The cholesterol last visit was:   Lab Results  Component Value Date   CHOL 174 12/24/2020   HDL 29 (L) 12/24/2020   LDLCALC 110 (H) 12/24/2020   TRIG 232 (H) 12/24/2020   CHOLHDL 6.0 (H) 12/24/2020   She has had diabetes for 10 + years.   She has been working on diet (limiting sweets) for T2 diabetes with CKD, no longer taking on metformin (abdominal pain and nausesa with 1 tab) - Taking glimepiride 4 mg BID daily Didn't do well with SGLT2i (was given numerous samples, reports had persistent yeast while taking) and denies foot ulcerations, increased appetite, nausea, paresthesia of the feet, polydipsia, polyuria, visual disturbances and weight loss.  Patient does NOT check glucose - needle phobia and adamantly declines, insulin Admittedly poorly compliant with lifestyle and medications due to intolerance, typically GI sx She is interested in trulicity - can have husband do weekly shot Last A1C in the office was:  Lab Results  Component Value Date   HGBA1C 10.0 (H) 12/24/2020   She has CKD I/II associated with T2DM monitored at this office:  Lab Results  Component Value Date   [facility] 78 06/24/2020   Patient is on Vitamin D  supplement, 5000 IU gummies, when she remembers  Lab Results  Component Value Date   VD25OH 15 (L) 12/24/2020        Medication Review: Current Outpatient Medications on File Prior to Visit  Medication Sig Dispense Refill   ALPRAZolam (XANAX) 0.5 MG tablet TAKE 1/2 TO 1 (ONE-HALF TO ONE) TABLET BY MOUTH AT BEDTIME AS NEEDED FOR ANXIETY OR SLEEP 30 tablet 0   CHOLECALCIFEROL PO Take 5,000 Units by mouth daily.     citalopram (CELEXA) 20 MG tablet Take 1 tablet by mouth once daily 90 tablet 3   diclofenac Sodium (VOLTAREN) 1 % GEL Apply 1 application topically 4 (four) times daily as needed.     nystatin cream (MYCOSTATIN) Apply 1 application topically 2 (two) times daily. 30 g 1   promethazine (PHENERGAN) 25 MG tablet Take 1 tablet (25 mg total) by mouth every 6 (six) hours as needed for nausea or vomiting. 30 tablet 3   QUEtiapine (SEROQUEL) 25 MG tablet Take 1 tablet at Bedtime as needed for Sleep 90 tablet 1   rosuvastatin (CRESTOR) 40 MG tablet Take 1 tablet (40 mg total) by mouth daily. 90 tablet 1   triamcinolone ointment (KENALOG) 0.1 %  Apply 1 application topically 2 (two) times daily. 80 g 1   aspirin EC 81 MG tablet Take 1 tablet (81 mg total) by mouth daily. Swallow whole. 90 tablet 3   glimepiride (AMARYL) 4 MG tablet TAKE 1 TABLET BY MOUTH TWICE DAILY WITH A MEAL (Patient not taking: Reported on 04/02/2021) 180 tablet 0   No current facility-administered medications on file prior to visit.    Allergies  Allergen Reactions   Codeine    Glipizide Nausea And Vomiting   Lunesta [Eszopiclone]     Excessive fatigue   Metformin And Related     Nausea and abdominal pain   Welchol [Colesevelam Hcl]     nausea      Current Problems (verified) Patient Active Problem List   Diagnosis Date Noted   Calcification of abdominal aorta ([place]) 07/02/2020   Adrenal adenoma, left 07/02/2020   Medication nonadherence due to intolerance 06/25/2020   LAFB (left anterior fascicular  block) 06/24/2020   3-vessel CAD (per CT chest 11/2019) 06/24/2020   Constipation 06/24/2020   Mitral valve annular calcification 03/07/2020   Poor compliance 12/07/2019   Aortic atherosclerosis ([place]) 08/01/2019   CKD stage 2 due to type 2 diabetes mellitus ([place]) 07/20/2019   Smoker 04/16/2019   Depression, major, recurrent, in partial remission ([place]) 01/10/2019   Arthralgia of both feet 10/04/2018   Overweight (BMI 25.0-29.9) 09/29/2017   Hyperlipidemia associated with type 2 diabetes mellitus ([place])    Diabetes ([place])    Colon cancer ([place])    Vitamin D deficiency    Anxiety    Leukocytosis 09/18/2012    Screening Tests Immunization History  Administered Date(s) Administered   Pneumococcal Polysaccharide-23 01/07/2020    Preventative care: Last colonoscopy: 2010 Dr. Earlean Shawl, overdue, hx of colon cancer, patient saw [facility] 2022 Last mammogram: 11/2019, given phone number to schedule Last pap smear/pelvic exam: 05/2013, hx of abnormal but had normal x 2 last checks, declines further DEXA: ordered but patient declines to schedule at this time   Prior vaccinations: TD or Tdap: declines   Influenza: declines Pneumococcal: 12/2019 Prevnar13: declines Shingles/Zostavax: declines  Covid 19: declines   Names of Other Physician/Practitioners you currently use: 1. [facility] here for primary care 2. [facility], eye doctor, last visit 08/2019, reminded to schedule this year 3. [facility], [employer], Pharmacist, community, last visit 2020, declines follow up, has full dentures  Patient Care Team: Unk Pinto, MD as PCP - General (Internal Medicine) Wyatt Portela, MD as Consulting Physician (Oncology)  SURGICAL HISTORY She  has a past surgical history that includes Appendectomy; Partial colectomy (2010); Laparoscopic cholecystectomy (2010); and Colonoscopy. FAMILY HISTORY Her family history includes Cancer - Ovarian in her mother; Diabetes  in her paternal grandmother and sister; Gastric cancer in her paternal aunt; Heart disease in her brother; Hyperlipidemia in her mother and sister; Hypertension in her mother. SOCIAL HISTORY She  reports that she has been smoking cigarettes. She started smoking about 44 years ago. She has a 33.00 pack-year smoking history. She has never used smokeless tobacco. She reports that she does not drink alcohol and does not use drugs.  Depression/mood screen:   Depression screen [facility] 2/9 04/02/2021  Decreased Interest 0  Down, Depressed, Hopeless 0  PHQ - 2 Score 0  Altered sleeping -  Tired, decreased energy -  Change in appetite -  Feeling bad or failure about yourself  -  Trouble concentrating -  Moving slowly or fidgety/restless -  Suicidal thoughts -  PHQ-9 Score -  Difficult doing work/chores -     Review of Systems  Constitutional:  Negative for malaise/fatigue and weight loss.  HENT:  Negative for hearing loss and tinnitus.   Eyes:  Negative for blurred vision and double vision.  Respiratory:  Negative for cough, sputum production, shortness of breath and wheezing.   Cardiovascular:  Negative for chest pain, palpitations, orthopnea, claudication, leg swelling and PND.  Gastrointestinal:  Positive for constipation (chronic). Negative for abdominal pain, blood in stool, diarrhea, heartburn, melena, nausea and vomiting.  Genitourinary: Negative.   Musculoskeletal:  Negative for falls (1 fall in 12 months ), joint pain (bil feet, improved) and myalgias.  Skin:  Negative for rash.  Neurological:  Negative for dizziness, tingling, sensory change, weakness and headaches.  Endo/Heme/Allergies:  Negative for polydipsia.  Psychiatric/Behavioral:  Negative for depression, memory loss, substance abuse and suicidal ideas. The patient is not nervous/anxious and does not have insomnia.   All other systems reviewed and are negative.   Objective:     Today's Vitals   04/02/21 1527  BP: 118/68   Pulse: 94  Temp: (!) 97.5 F (36.4 C)  SpO2: 99%  Weight: 163 lb (73.9 kg)   Body mass index is 29.34 kg/m.  General appearance: alert, no distress, WD/WN, female HEENT: normocephalic, sclerae anicteric, TMs pearly, nares patent, no discharge or erythema, pharynx normal Oral cavity: MMM, no lesions Neck: supple, no lymphadenopathy, no thyromegaly, no masses Heart: RRR, normal S1, S2, no murmurs  Lungs: CTA bilaterally, no wheezes, rhonchi, or rales Abdomen: +bs, firm, mildly distended, generalized tenderness with light palpation, no distinct palpable masses or organomegaly; tenderness limiting exam Musculoskeletal: nontender, no swelling, no obvious deformity Extremities: no edema, no cyanosis, no clubbing Pulses: 2+ symmetric, upper and lower extremities, normal cap refill Neurological: alert, oriented x 3, CN2-12 intact, strength normal upper extremities and lower extremities, sensation normal throughout, DTRs 2+ throughout, no cerebellar signs, gait slow steady Psychiatric: normal affect, behavior normal, pleasant     Medicare Attestation I have personally reviewed: The patient's medical and social history Their use of alcohol, tobacco or illicit drugs Their current medications and supplements The patient's functional ability including ADLs,fall risks, home safety risks, cognitive, and hearing and visual impairment Diet and physical activities Evidence for depression or mood disorders  The patient's weight, height, BMI, and visual acuity have been recorded in the chart.  I have made referrals, counseling, and provided education to the patient based on review of the above and I have provided the patient with a written personalized care plan for preventive services.     Izora Ribas, NP   04/02/2021

## 2021-04-02 ENCOUNTER — Ambulatory Visit (INDEPENDENT_AMBULATORY_CARE_PROVIDER_SITE_OTHER): Payer: Medicare Other | Admitting: Adult Health

## 2021-04-02 ENCOUNTER — Encounter: Payer: Self-pay | Admitting: Adult Health

## 2021-04-02 ENCOUNTER — Other Ambulatory Visit: Payer: Self-pay

## 2021-04-02 VITALS — BP 118/68 | HR 94 | Temp 97.5°F | Wt 163.0 lb

## 2021-04-02 DIAGNOSIS — R6889 Other general symptoms and signs: Secondary | ICD-10-CM | POA: Diagnosis not present

## 2021-04-02 DIAGNOSIS — D72829 Elevated white blood cell count, unspecified: Secondary | ICD-10-CM

## 2021-04-02 DIAGNOSIS — E1169 Type 2 diabetes mellitus with other specified complication: Secondary | ICD-10-CM | POA: Diagnosis not present

## 2021-04-02 DIAGNOSIS — E663 Overweight: Secondary | ICD-10-CM

## 2021-04-02 DIAGNOSIS — I3481 Nonrheumatic mitral (valve) annulus calcification: Secondary | ICD-10-CM

## 2021-04-02 DIAGNOSIS — E1122 Type 2 diabetes mellitus with diabetic chronic kidney disease: Secondary | ICD-10-CM | POA: Diagnosis not present

## 2021-04-02 DIAGNOSIS — I444 Left anterior fascicular block: Secondary | ICD-10-CM

## 2021-04-02 DIAGNOSIS — Z9114 Patient's other noncompliance with medication regimen: Secondary | ICD-10-CM

## 2021-04-02 DIAGNOSIS — Z794 Long term (current) use of insulin: Secondary | ICD-10-CM

## 2021-04-02 DIAGNOSIS — E559 Vitamin D deficiency, unspecified: Secondary | ICD-10-CM

## 2021-04-02 DIAGNOSIS — E785 Hyperlipidemia, unspecified: Secondary | ICD-10-CM

## 2021-04-02 DIAGNOSIS — Z0000 Encounter for general adult medical examination without abnormal findings: Secondary | ICD-10-CM

## 2021-04-02 DIAGNOSIS — I700 Atherosclerosis of aorta: Secondary | ICD-10-CM

## 2021-04-02 DIAGNOSIS — I2510 Atherosclerotic heart disease of native coronary artery without angina pectoris: Secondary | ICD-10-CM

## 2021-04-02 DIAGNOSIS — F3341 Major depressive disorder, recurrent, in partial remission: Secondary | ICD-10-CM

## 2021-04-02 DIAGNOSIS — N182 Chronic kidney disease, stage 2 (mild): Secondary | ICD-10-CM

## 2021-04-02 DIAGNOSIS — F17200 Nicotine dependence, unspecified, uncomplicated: Secondary | ICD-10-CM

## 2021-04-02 DIAGNOSIS — K5900 Constipation, unspecified: Secondary | ICD-10-CM

## 2021-04-02 DIAGNOSIS — C189 Malignant neoplasm of colon, unspecified: Secondary | ICD-10-CM | POA: Diagnosis not present

## 2021-04-02 DIAGNOSIS — Z1231 Encounter for screening mammogram for malignant neoplasm of breast: Secondary | ICD-10-CM

## 2021-04-02 DIAGNOSIS — Z87891 Personal history of nicotine dependence: Secondary | ICD-10-CM

## 2021-04-02 DIAGNOSIS — D3502 Benign neoplasm of left adrenal gland: Secondary | ICD-10-CM

## 2021-04-02 DIAGNOSIS — Z0001 Encounter for general adult medical examination with abnormal findings: Secondary | ICD-10-CM | POA: Diagnosis not present

## 2021-04-02 DIAGNOSIS — F419 Anxiety disorder, unspecified: Secondary | ICD-10-CM

## 2021-04-02 MED ORDER — TRULICITY 0.75 MG/0.5ML ~~LOC~~ SOAJ: 0.7500 mg | SUBCUTANEOUS | 0 refills | Status: DC

## 2021-04-02 MED ORDER — FENOFIBRATE 145 MG PO TABS: 145.0000 mg | ORAL_TABLET | Freq: Every day | ORAL | 1 refills | Status: DC

## 2021-04-02 NOTE — Patient Instructions (Addendum)
Please schedule diabetes eye exam -    Can do miralax twice a day (with lots of water) Sennakot - do consistently  "Align" probiotic 2-3 months, if no improvement, stop  Citrucel, benefiber, etc - soluble fiber     HOW TO SCHEDULE A MAMMOGRAM  [facility]  7 a.m.-6:30 p.m., Monday 7 a.m.-5 p.m., Tuesday-Friday Schedule an appointment by calling [phone].    Dulaglutide Injection What is this medication? DULAGLUTIDE (DOO la GLOO tide) treats type 2 diabetes. It works by increasing insulin levels in your body, which decreases your blood sugar (glucose). It also reduces the amount of sugar released into your blood and slows down your digestion. It can also be used to lower the risk of heart attack and stroke in people with type 2 diabetes. Changes to diet and exercise are often combined with this medication. This medicine may be used for other purposes; ask your health care provider or pharmacist if you have questions. COMMON BRAND NAME(S): Trulicity What should I tell my care team before I take this medication? They need to know if you have any of these conditions: Endocrine tumors (MEN 2) or if someone in your family had these tumors Eye disease, vision problems History of pancreatitis Kidney disease Liver disease Stomach or intestine problems Thyroid cancer or if someone in your family had thyroid cancer An unusual or allergic reaction to dulaglutide, other medications, foods, dyes, or preservatives Pregnant or trying to get pregnant Breast-feeding How should I use this medication? This medication is injected under the skin. You will be taught how to prepare and give it. Take it as directed on the prescription label on the same day of each week. Do NOT prime the pen. Keep taking it unless your care team tells you to stop. If you use this medication with insulin, you should inject this medication and the insulin separately. Do not mix them  together. Do not give the injections right next to each other. Change (rotate) injection sites with each injection. This medication comes with INSTRUCTIONS FOR USE. Ask your pharmacist for directions on how to use this medication. Read the information carefully. Talk to your pharmacist or care team if you have questions. It is important that you put your used needles and syringes in a special sharps container. Do not put them in a trash can. If you do not have a sharps container, call your pharmacist or care team to get one. A special MedGuide will be given to you by the pharmacist with each prescription and refill. Be sure to read this information carefully each time. Talk to your care team about the use of this medication in children. Special care may be needed. Overdosage: If you think you have taken too much of this medicine contact a poison control center or emergency room at once. NOTE: This medicine is only for you. Do not share this medicine with others. What if I miss a dose? If you miss a dose, take it as soon as you can unless it is more than 3 days late. If it is more than 3 days late, skip the missed dose. Take the next dose at the normal time. What may interact with this medication? Other medications for diabetes Many medications may cause changes in blood sugar, these include: Alcohol containing beverages Antiviral medications for HIV or AIDS Aspirin and aspirin-like medications Certain medications for blood pressure, heart disease, irregular heart beat Chromium Diuretics Female hormones, such as estrogens or progestins,  birth control pills Fenofibrate Gemfibrozil Isoniazid Lanreotide Female hormones or anabolic steroids MAOIs like Carbex, Eldepryl, Marplan, Nardil, and Parnate Medications for allergies, asthma, cold, or cough Medications for depression, anxiety, or psychotic disturbances Medications for weight loss Niacin Nicotine NSAIDs, medications for pain and  inflammation, like ibuprofen or naproxen Octreotide Pasireotide Pentamidine Phenytoin Probenecid Quinolone antibiotics such as ciprofloxacin, levofloxacin, ofloxacin Some herbal dietary supplements Steroid medications such as prednisone or cortisone Sulfamethoxazole; trimethoprim Thyroid hormones Some medications can hide the warning symptoms of low blood sugar (hypoglycemia). You may need to monitor your blood sugar more closely if you are taking one of these medications. These include: Beta-blockers, often used for high blood pressure or heart problems (examples include atenolol, metoprolol, propranolol) Clonidine Guanethidine Reserpine This list may not describe all possible interactions. Give your health care provider a list of all the medicines, herbs, non-prescription drugs, or dietary supplements you use. Also tell them if you smoke, drink alcohol, or use illegal drugs. Some items may interact with your medicine. What should I watch for while using this medication? Visit your care team for regular checks on your progress. Check with your care team if you have severe diarrhea, nausea, and vomiting, or if you sweat a lot. The loss of too much body fluid may make it dangerous for you to take this medication. A test called the HbA1C (A1C) will be monitored. This is a simple blood test. It measures your blood sugar control over the last 2 to 3 months. You will receive this test every 3 to 6 months. Learn how to check your blood sugar. Learn the symptoms of low and high blood sugar and how to manage them. Always carry a quick-source of sugar with you in case you have symptoms of low blood sugar. Examples include hard sugar candy or glucose tablets. Make sure others know that you can choke if you eat or drink when you develop serious symptoms of low blood sugar, such as seizures or unconsciousness. Get medical help at once. Tell your care team if you have high blood sugar. You might need to  change the dose of your medication. If you are sick or exercising more than usual, you may need to change the dose of your medication. Do not skip meals. Ask your care team if you should avoid alcohol. Many nonprescription cough and cold products contain sugar or alcohol. These can affect blood sugar. Pens should never be shared. Even if the needle is changed, sharing may result in passing of viruses like hepatitis or HIV. Wear a medical ID bracelet or chain. Carry a card that describes your condition. List the medications and doses you take on the card. What side effects may I notice from receiving this medication? Side effects that you should report to your care team as soon as possible: Allergic reactions-skin rash, itching, hives, swelling of the face, lips, tongue, or throat Change in vision Dehydration-increased thirst, dry mouth, feeling faint or lightheaded, headache, dark yellow or brown urine Kidney injury-decrease in the amount of urine, swelling of the ankles, hands, or feet Pancreatitis-severe stomach pain that spreads to your back or gets worse after eating or when touched, fever, nausea, vomiting Thyroid cancer-new mass or lump in the neck, pain or trouble swallowing, trouble breathing, hoarseness Side effects that usually do not require medical attention (report to your care team if they continue or are bothersome): Diarrhea Loss of appetite Nausea Stomach pain Vomiting This list may not describe all possible side effects. Call your  doctor for medical advice about side effects. You may report side effects to FDA at 1-800-FDA-1088. Where should I keep my medication? Keep out of the reach of children and pets. Refrigeration (preferred): Store unopened pens in a refrigerator between 2 and 8 degrees C (36 and 46 degrees F). Keep it in the original carton until you are ready to take it. Do not freeze or use if the medication has been frozen. Protect from light. Get rid of any unused  medication after the expiration date on the label. Room Temperature: The pen may be stored at room temperature below 30 degrees C (86 degrees F) for up to a total of 14 days if needed. Protect from light. Avoid exposure to extreme heat. If it is stored at room temperature, throw away any unused medication after 14 days or after it expires, whichever is first. To get rid of medications that are no longer needed or have expired: Take the medication to a medication take-back program. Check with your pharmacy or law enforcement to find a location. If you cannot return the medication, ask your pharmacist or care team how to get rid of this medication safely. NOTE: This sheet is a summary. It may not cover all possible information. If you have questions about this medicine, talk to your doctor, pharmacist, or health care provider.  2022 Elsevier/Gold Standard (2020-07-14 14:13:14)

## 2021-04-03 LAB — COMPLETE METABOLIC PANEL WITH GFR
AG Ratio: 1.5 (calc) (ref 1.0–2.5)
ALT: 11 U/L (ref 6–29)
AST: 12 U/L (ref 10–35)
Albumin: 4.4 g/dL (ref 3.6–5.1)
Alkaline phosphatase (APISO): 74 U/L (ref 37–153)
BUN: 18 mg/dL (ref 7–25)
CO2: 26 mmol/L (ref 20–32)
Calcium: 10.5 mg/dL — ABNORMAL HIGH (ref 8.6–10.4)
Chloride: 99 mmol/L (ref 98–110)
Creat: 0.88 mg/dL (ref 0.50–1.05)
Globulin: 2.9 g/dL (calc) (ref 1.9–3.7)
Glucose, Bld: 205 mg/dL — ABNORMAL HIGH (ref 65–99)
Potassium: 5 mmol/L (ref 3.5–5.3)
Sodium: 136 mmol/L (ref 135–146)
Total Bilirubin: 0.5 mg/dL (ref 0.2–1.2)
Total Protein: 7.3 g/dL (ref 6.1–8.1)
eGFR: 72 mL/min/{1.73_m2} (ref >=60)

## 2021-04-03 LAB — CBC WITH DIFFERENTIAL/PLATELET
Absolute Monocytes: 754 cells/uL (ref 200–950)
Basophils Absolute: 147 cells/uL (ref 0–200)
Basophils Relative: 0.8 %
Eosinophils Absolute: 331 cells/uL (ref 15–500)
Eosinophils Relative: 1.8 %
HCT: 42.1 % (ref 35.0–45.0)
Hemoglobin: 13.1 g/dL (ref 11.7–15.5)
Lymphs Abs: 5722 cells/uL — ABNORMAL HIGH (ref 850–3900)
MCH: 19.1 pg — ABNORMAL LOW (ref 27.0–33.0)
MCHC: 31.1 g/dL — ABNORMAL LOW (ref 32.0–36.0)
MCV: 61.4 fL — ABNORMAL LOW (ref 80.0–100.0)
MPV: 10 fL (ref 7.5–12.5)
Monocytes Relative: 4.1 %
Neutro Abs: 11445 cells/uL — ABNORMAL HIGH (ref 1500–7800)
Neutrophils Relative %: 62.2 %
Platelets: 501 10*3/uL — ABNORMAL HIGH (ref 140–400)
RBC: 6.86 10*6/uL — ABNORMAL HIGH (ref 3.80–5.10)
RDW: 17.9 % — ABNORMAL HIGH (ref 11.0–15.0)
Total Lymphocyte: 31.1 %
WBC: 18.4 10*3/uL — ABNORMAL HIGH (ref 3.8–10.8)

## 2021-04-03 LAB — LIPID PANEL
Cholesterol: 245 mg/dL — ABNORMAL HIGH (ref <200)
HDL: 31 mg/dL — ABNORMAL LOW (ref >=50)
LDL Cholesterol (Calc): 162 mg/dL (calc) — ABNORMAL HIGH
Non-HDL Cholesterol (Calc): 214 mg/dL (calc) — ABNORMAL HIGH (ref <130)
Total CHOL/HDL Ratio: 7.9 (calc) — ABNORMAL HIGH (ref <5.0)
Triglycerides: 335 mg/dL — ABNORMAL HIGH (ref <150)

## 2021-04-03 LAB — MAGNESIUM: Magnesium: 2.1 mg/dL (ref 1.5–2.5)

## 2021-04-03 LAB — HEMOGLOBIN A1C
Hgb A1c MFr Bld: 9.1 % of total Hgb — ABNORMAL HIGH (ref <5.7)
Mean Plasma Glucose: 214 mg/dL
eAG (mmol/L): 11.9 mmol/L

## 2021-04-03 LAB — TSH: TSH: 3.55 mIU/L (ref 0.40–4.50)

## 2021-04-15 ENCOUNTER — Other Ambulatory Visit: Payer: Self-pay | Admitting: Adult Health

## 2021-04-15 DIAGNOSIS — Z1231 Encounter for screening mammogram for malignant neoplasm of breast: Secondary | ICD-10-CM

## 2021-04-21 ENCOUNTER — Encounter: Payer: Self-pay | Admitting: Adult Health

## 2021-04-29 ENCOUNTER — Other Ambulatory Visit: Payer: Self-pay | Admitting: Adult Health

## 2021-04-29 DIAGNOSIS — G4700 Insomnia, unspecified: Secondary | ICD-10-CM

## 2021-05-19 ENCOUNTER — Ambulatory Visit: Payer: Medicare Other

## 2021-06-05 ENCOUNTER — Ambulatory Visit: Payer: Medicare Other | Admitting: Cardiovascular Disease

## 2021-06-11 ENCOUNTER — Other Ambulatory Visit: Payer: Self-pay | Admitting: Internal Medicine

## 2021-06-11 DIAGNOSIS — F3341 Major depressive disorder, recurrent, in partial remission: Secondary | ICD-10-CM

## 2021-06-18 ENCOUNTER — Ambulatory Visit: Payer: Medicare Other

## 2021-06-23 ENCOUNTER — Telehealth: Payer: Self-pay

## 2021-06-23 ENCOUNTER — Encounter: Payer: Medicare Other | Admitting: Adult Health

## 2021-06-23 NOTE — Telephone Encounter (Signed)
Patient meets program eligibility requirements and is enrolled in [place] until the end of the 2023 calendar year for Trulicity.

## 2021-06-30 ENCOUNTER — Other Ambulatory Visit: Payer: Self-pay

## 2021-06-30 ENCOUNTER — Ambulatory Visit
Admission: RE | Admit: 2021-06-30 | Discharge: 2021-06-30 | Disposition: A | Discharge status: Home / Self Care | Payer: Medicare Other | Source: Ambulatory Visit | Attending: Adult Health | Admitting: Adult Health

## 2021-06-30 DIAGNOSIS — Z1231 Encounter for screening mammogram for malignant neoplasm of breast: Secondary | ICD-10-CM

## 2021-07-07 ENCOUNTER — Other Ambulatory Visit: Payer: Self-pay

## 2021-07-07 ENCOUNTER — Ambulatory Visit (INDEPENDENT_AMBULATORY_CARE_PROVIDER_SITE_OTHER): Payer: Medicare Other | Admitting: Cardiovascular Disease

## 2021-07-07 ENCOUNTER — Encounter: Payer: Self-pay | Admitting: Cardiovascular Disease

## 2021-07-07 VITALS — BP 110/64 | HR 96 | Ht 63.0 in | Wt 164.2 lb

## 2021-07-07 DIAGNOSIS — I2510 Atherosclerotic heart disease of native coronary artery without angina pectoris: Secondary | ICD-10-CM

## 2021-07-07 DIAGNOSIS — I3481 Nonrheumatic mitral (valve) annulus calcification: Secondary | ICD-10-CM

## 2021-07-07 DIAGNOSIS — R0989 Other specified symptoms and signs involving the circulatory and respiratory systems: Secondary | ICD-10-CM | POA: Diagnosis not present

## 2021-07-07 DIAGNOSIS — E785 Hyperlipidemia, unspecified: Secondary | ICD-10-CM

## 2021-07-07 DIAGNOSIS — Z720 Tobacco use: Secondary | ICD-10-CM

## 2021-07-07 DIAGNOSIS — E1169 Type 2 diabetes mellitus with other specified complication: Secondary | ICD-10-CM | POA: Diagnosis not present

## 2021-07-07 NOTE — Assessment & Plan Note (Signed)
Mitral annular calcification seen on 2D echocardiogram performed 03/06/2020 without evidence of mitral regurgitation.  LV function was normal.

## 2021-07-07 NOTE — Assessment & Plan Note (Signed)
History of three-vessel coronary calcification seen on chest CT performed 01/04/2020.  I am going to get a coronary calcium score to help quantify.

## 2021-07-07 NOTE — Assessment & Plan Note (Signed)
History of hyperlipidemia on Crestor and fenofibrate with lipid profile performed 04/02/2021 revealing total cholesterol 245, LDL 162 and HDL 31.  I am going to get a coronary calcium score to determine how aggressive to be with lipid-lowering although I suspect she will end up needing to be on a PCSK9.

## 2021-07-07 NOTE — Patient Instructions (Addendum)
Medication Instructions:  Your physician recommends that you continue on your current medications as directed. Please refer to the Current Medication list given to you today.  *If you need a refill on your cardiac medications before your next appointment, please call your pharmacy*   Testing/Procedures: Your physician has requested that you have a carotid duplex. This test is an ultrasound of the carotid arteries in your neck. It looks at blood flow through these arteries that supply the brain with blood. Allow one hour for this exam. There are no restrictions or special instructions. This procedure is done [place]   Dr. Gwenlyn Found has ordered a CT coronary calcium score.   Test location:  [facility] ([address], [place] [address])   This is $99 out of pocket.   Coronary CalciumScan A coronary calcium scan is an imaging test used to look for deposits of calcium and other fatty materials (plaques) in the inner lining of the blood vessels of the heart (coronary arteries). These deposits of calcium and plaques can partly clog and narrow the coronary arteries without producing any symptoms or warning signs. This puts a person at risk for a heart attack. This test can detect these deposits before symptoms develop. Tell a health care provider about: Any allergies you have. All medicines you are taking, including vitamins, herbs, eye drops, creams, and over-the-counter medicines. Any problems you or family members have had with anesthetic medicines. Any blood disorders you have. Any surgeries you have had. Any medical conditions you have. Whether you are pregnant or may be pregnant. What are the risks? Generally, this is a safe procedure. However, problems may occur, including: Harm to a pregnant woman and her unborn baby. This test involves the use of radiation. Radiation exposure can be dangerous to a pregnant woman and her unborn baby. If you are  pregnant, you generally should not have this procedure done. Slight increase in the risk of cancer. This is because of the radiation involved in the test. What happens before the procedure? No preparation is needed for this procedure. What happens during the procedure? You will undress and remove any jewelry around your neck or chest. You will put on a hospital gown. Sticky electrodes will be placed on your chest. The electrodes will be connected to an electrocardiogram (ECG) machine to record a tracing of the electrical activity of your heart. A CT scanner will take pictures of your heart. During this time, you will be asked to lie still and hold your breath for 2-3 seconds while a picture of your heart is being taken. The procedure may vary among health care providers and hospitals. What happens after the procedure? You can get dressed. You can return to your normal activities. It is up to you to get the results of your test. Ask your health care provider, or the department that is doing the test, when your results will be ready. Summary A coronary calcium scan is an imaging test used to look for deposits of calcium and other fatty materials (plaques) in the inner lining of the blood vessels of the heart (coronary arteries). Generally, this is a safe procedure. Tell your health care provider if you are pregnant or may be pregnant. No preparation is needed for this procedure. A CT scanner will take pictures of your heart. You can return to your normal activities after the scan is done. This information is not intended to replace advice given to you by your health  care provider. Make sure you discuss any questions you have with your health care provider. Document Released: 11/13/2007 Document Revised: 04/05/2016 Document Reviewed: 04/05/2016 Elsevier Interactive Patient Education  2017 [place]: At [facility], you and your health needs are our priority.  As part of  our continuing mission to provide you with exceptional heart care, we have created designated Provider Care Teams.  These Care Teams include your primary Cardiologist (physician) and Advanced Practice Providers (APPs -  Physician Assistants and Nurse Practitioners) who all work together to provide you with the care you need, when you need it.  We recommend signing up for the patient portal called "MyChart".  Sign up information is provided on this After Visit Summary.  MyChart is used to connect with patients for Virtual Visits (Telemedicine).  Patients are able to view lab/test results, encounter notes, upcoming appointments, etc.  Non-urgent messages can be sent to your provider as well.   To learn more about what you can do with MyChart, go to [email or web address].    Your next appointment:   6 month(s)  The format for your next appointment:   In Person  Provider:   Quay Burow, MD

## 2021-07-07 NOTE — Assessment & Plan Note (Signed)
50 pack years of tobacco abuse smoking 1 pack a day up until last month now smoking 1/2 pack a day.  I stressed the importance of smoking cessation.

## 2021-07-07 NOTE — Progress Notes (Signed)
07/07/2021 Dorothy Boyd   [birth year]  [id number]  Primary Physician Unk Pinto, MD Primary Cardiologist: Lorretta Harp MD [name], [place]  HPI:  Dorothy Boyd is a 66 y.o.    Moderately overweight married Caucasian female mother of 2 children, grandmother of 5 grandchildren who was referred by Liane Comber nurse practitioner for cardiovascular valuation because of coronary calcification seen on chest CT.  She is retired from being in the transportation business and the Engineer, petroleum.  Her cardiovascular risk factor profile is notable for 50 pack years tobacco use currently smoking 1/2 pack/day, treated diabetes and hyperlipidemia.  There is no family history of heart disease.  She is never had a heart attack or stroke.  She denies chest pain or shortness of breath.  She did have mitral annular calcification seen on 2D echo performed 03/16/2020 and three-vessel coronary calcification seen on chest CT performed 01/04/2020 although she is completely asymptomatic.   Current Meds  Medication Sig   ALPRAZolam (XANAX) 0.5 MG tablet TAKE 1/2 TO 1 (ONE-HALF TO ONE) TABLET BY MOUTH ONCE DAILY AT BEDTIME AS NEEDED FOR ANXIETY FOR SLEEP   CHOLECALCIFEROL PO Take 5,000 Units by mouth daily.   citalopram (CELEXA) 20 MG tablet Take 1 tablet by mouth once daily   diclofenac Sodium (VOLTAREN) 1 % GEL Apply 1 application topically 4 (four) times daily as needed.   Dulaglutide (TRULICITY) 6.96 EX/5.2WU SOPN Inject 0.75 mg into the skin once a week.   fenofibrate (TRICOR) 145 MG tablet Take 1 tablet (145 mg total) by mouth daily.   glimepiride (AMARYL) 4 MG tablet TAKE 1 TABLET BY MOUTH TWICE DAILY WITH A MEAL   nystatin cream (MYCOSTATIN) Apply 1 application topically 2 (two) times daily.   promethazine (PHENERGAN) 25 MG tablet Take 1 tablet (25 mg total) by mouth every 6 (six) hours as needed for nausea or vomiting.   QUEtiapine (SEROQUEL) 25 MG tablet TAKE 1 TABLET BY MOUTH AT  BEDTIME AS NEEDED FOR SLEEP   rosuvastatin (CRESTOR) 40 MG tablet Take 1 tablet (40 mg total) by mouth daily.   triamcinolone ointment (KENALOG) 0.1 % Apply 1 application topically 2 (two) times daily.     Allergies  Allergen Reactions   Codeine    Glipizide Nausea And Vomiting   Lunesta [Eszopiclone]     Excessive fatigue   Metformin And Related     Nausea and abdominal pain   Welchol [Colesevelam Hcl]     nausea     Social History   Socioeconomic History   Marital status: Married    Spouse name: Not on file   Number of children: Not on file   Years of education: Not on file   Highest education level: Not on file  Occupational History   Not on file  Tobacco Use   Smoking status: Every Day    Packs/day: 0.75    Years: 44.00    Pack years: 33.00    Types: Cigarettes    Start date: 79   Smokeless tobacco: Never  Vaping Use   Vaping Use: Never used  Substance and Sexual Activity   Alcohol use: No   Drug use: No   Sexual activity: Yes    Partners: Male    Birth control/protection: Post-menopausal  Other Topics Concern   Not on file  Social History Narrative   Not on file   Social Determinants of Health   Financial Resource Strain: Not on file  Food Insecurity:  Not on file  Transportation Needs: Not on file  Physical Activity: Not on file  Stress: Not on file  Social Connections: Not on file  Intimate Partner Violence: Not on file     Review of Systems: General: negative for chills, fever, night sweats or weight changes.  Cardiovascular: negative for chest pain, dyspnea on exertion, edema, orthopnea, palpitations, paroxysmal nocturnal dyspnea or shortness of breath Dermatological: negative for rash Respiratory: negative for cough or wheezing Urologic: negative for hematuria Abdominal: negative for nausea, vomiting, diarrhea, bright red blood per rectum, melena, or hematemesis Neurologic: negative for visual changes, syncope, or dizziness All other  systems reviewed and are otherwise negative except as noted above.    Blood pressure 110/64, pulse 96, height 5\' 3"  (1.6 m), weight 164 lb 3.2 oz (74.5 kg), SpO2 98 %.  General appearance: alert and no distress Neck: no adenopathy, no JVD, supple, symmetrical, trachea midline, thyroid not enlarged, symmetric, no tenderness/mass/nodules, and soft right carotid bruit Lungs: clear to auscultation bilaterally Heart: regular rate and rhythm, S1, S2 normal, no murmur, click, rub or gallop Extremities: extremities normal, atraumatic, no cyanosis or edema Pulses: 2+ and symmetric Skin: Skin color, texture, turgor normal. No rashes or lesions Neurologic: Grossly normal  EKG sinus rhythm at 96 with left anterior fascicular block and nonspecific ST and T wave changes.  I personally reviewed this EKG.  ASSESSMENT AND PLAN:   Hyperlipidemia associated with type 2 diabetes mellitus ([place]) History of hyperlipidemia on Crestor and fenofibrate with lipid profile performed 04/02/2021 revealing total cholesterol 245, LDL 162 and HDL 31.  I am going to get a coronary calcium score to determine how aggressive to be with lipid-lowering although I suspect she will end up needing to be on a PCSK9.  Mitral valve annular calcification Mitral annular calcification seen on 2D echocardiogram performed 03/06/2020 without evidence of mitral regurgitation.  LV function was normal.  3-vessel CAD (per CT chest 11/2019) History of three-vessel coronary calcification seen on chest CT performed 01/04/2020.  I am going to get a coronary calcium score to help quantify.  Tobacco abuse 50 pack years of tobacco abuse smoking 1 pack a day up until last month now smoking 1/2 pack a day.  I stressed the importance of smoking cessation.     Lorretta Harp MD FACP,FACC,FAHA, [facility] 07/07/2021 3:38 PM

## 2021-07-10 DIAGNOSIS — Z85038 Personal history of other malignant neoplasm of large intestine: Secondary | ICD-10-CM | POA: Insufficient documentation

## 2021-07-10 NOTE — Progress Notes (Addendum)
CPE AND FOLLOW UP  Assessment:    Dorothy Boyd was seen today for follow-up and wellness.  Diagnoses and all orders for this visit:  Encounter for Annual Physical Exam with abnormal findings Due annually  Health Maintenance reviewed Healthy lifestyle reviewed and goals set  Declines vaccines  Declines PAP/pelvic exam  Continue annual mammogram  Reminded to schedule diabetes eye exam and have report forwarded - she has phone number to schedule  CT lung ordered Refer to derm  Aortic atherosclerosis ([place]) Per CT 2019 Control blood pressure, cholesterol, glucose, increase exercise.  -     Lipid panel  3 Vessel CAD STOP SMOKING Denies angina Control blood pressure, cholesterol, glucose, increase exercise.  Recommend bASA, and statin  Has seen Dr. Gwenlyn Found, pending CT coronary calcium, she has declined to pursue at this time  Mitral valve annular calcificiation Denies concerning sx; stop smoking, control cholesterol and sugars Est with cardiology, monitoring only   History of colon cancer  Hx of, s/p resection, has [name] in 2022, still no report but was recommended 1 year recall, will refer back if hasn't heard to schedule by May 2023.     Hyperlipidemia associated with type 2 diabetes mellitus ([place]) Complicated by poor compliance and med SE Was tolerating rosuvastatin 40 mg daily but has been off/out, counseled and resent to start LDL goal <70 Continue low cholesterol diet and exercise.  Check lipid panel.  -     Lipid panel -     TSH  Type 2 diabetes mellitus with stage 1 chronic kidney disease, with long-term current use of insulin ([name]) Type 2 Diabetes Mellitus - poorly controlled Complicated by non-compliance, med intolerance Intolerance: metformin, trulicity, [id number] (numerous) ABSOLUTELY DECLINES INSULIN OR CHECKING GLUCOSE - fear of needles Maximized on glimepiride  She has been tolerating trulicity 1.60 mg though with  constipation Lifestyle emphasized  Education: Reviewed ABCs of diabetes management (respective goals in parentheses):  A1C (<7), blood pressure (<130/80), and cholesterol (LDL <70) Eye Exam yearly and Dental Exam every 6 months. Dietary recommendations Physical Activity recommendations -     COMPLETE METABOLIC PANEL WITH GFR -     Hemoglobin A1c  CKD stage 2 due to type 2 diabetes mellitus ([name]) Increase fluids, avoid NSAIDS, monitor sugars, will monitor  Diabetic peripheral neuropathy ([name]) Declines medications at this time, emphasized improved glucose control, quit smoking, increase exercise, needs daily feet checks. Est with podiatry -   Depression, major, recurrent, in partial remission ([name]) Celexa 20 mg daily, seroquel, doing well  Follow up sooner if SE or other concerns, continue seroquel Lifestyle discussed: diet/exerise, sleep hygiene, stress management, hydration  Vitamin D deficiency Encouraged taking supplement consistently, check levels   Smoker Discussed risks associated with tobacco use and advised to reduce or quit Patient is not ready to do so, but advised to consider strongly Will follow up at the next visit  -lung cancer screening with low dose CT discussed as recommended by guidelines based on age, number of pack year history.  Discussed risks of screening including but not limited to false positives on xray, further testing or consultation with specialist, and possible false negative CT as well. Understanding expressed and wishes to proceed with CT testing. Continue annually, due in 11/2020 - order placed   Overweight (BMI 25.0-29.9) Continue to recommend diet heavy in fruits and veggies and low in animal meats, cheeses, and dairy products, appropriate calorie intake Discuss exercise recommendations routinely Continue to monitor weight at each visit  Leukocytosis, unspecified type Has recently seen [name]; monitoring q93m here -     CBC with  Differential/Platelet  Arthralgia of both feet Continue diclofenac gel PRN  Anxiety Continue celexa Stress management techniques discussed, increase water, good sleep hygiene discussed, increase exercise, and increase veggies.   Poor compliance Continue to discuss barriers, encourage compliance  Osteoporosis screening Order in system; patient declines at this time   Constipation Reminded miralax needs to be taken with plenty of water Increase fiber and fluid intake, work up to 65+ fluid ounces If needed (3+ days no movement) mag citrate 1/2 bottle with 2 glasses of water, repeat in 4 hours if needed Discussed needs to have BM at least every other day Sent in lactulose to try if not improved with above Please go to the ER if you have any severe AB pain, unable to hold down food/water, blood in stool or vomit, chest pain, shortness of breath, or any worsening symptoms.   Atypical lesion Cannot resect today due to time, she requests referral to derm close to her for this, referral placed  Orders Placed This Encounter  Procedures   CT CHEST LUNG CA SCREEN LOW DOSE W/O CM   CBC with Differential/Platelet   COMPLETE METABOLIC PANEL WITH GFR   Magnesium   Lipid panel   TSH   Hemoglobin A1c   VITAMIN D 25 Hydroxy (Vit-D Deficiency, Fractures)   Iron, TIBC and Ferritin Panel   Vitamin B12   Microalbumin / creatinine urine ratio   Urinalysis, Routine w reflex microscopic   Ambulatory referral to Dermatology   HM DIABETES FOOT EXAM     Over 40 minutes of exam, counseling, chart review and critical decision making was performed Future Appointments  Date Time Provider [place]  07/17/2021  2:00 PM [facility]-CV NL VASC 4 [facility]  08/28/2021  2:00 PM [facility]-CT 1 LBCT-CT LB-CT [facility]  10/15/2021  4:00 PM Liane Comber, NP [facility] None  04/06/2022  4:00 PM Liane Comber, NP [facility] None  07/14/2022  3:00 PM Liane Comber, NP [facility] None     Plan:   During  the course of the visit the patient was educated and counseled about appropriate screening and preventive services including:   Pneumococcal vaccine  Prevnar 13 Influenza vaccine Td vaccine Screening electrocardiogram Bone densitometry screening Colorectal cancer screening Diabetes screening Glaucoma screening Nutrition counseling  Advanced directives: requested   Subjective:  SAIGE CANTON is a 66 y.o. female who presents for CPE and 3 month follow up. She has Leukocytosis; Hyperlipidemia associated with type 2 diabetes mellitus ([place]); Diabetes ([place]); Vitamin D deficiency; Anxiety; Overweight (BMI 25.0-29.9); Arthralgia of both feet; Depression, major, recurrent, in partial remission ([place]); Tobacco abuse; CKD stage 2 due to type 2 diabetes mellitus ([place]); Aortic atherosclerosis ([place]); Poor compliance; Mitral valve annular calcification; LAFB (left anterior fascicular block); 3-vessel CAD (per CT chest 11/2019); Constipation; Medication nonadherence due to intolerance; Calcification of abdominal aorta ([place]); Adrenal adenoma, left; History of colon cancer; and Diabetic peripheral neuropathy associated with type 2 diabetes mellitus ([place]) on their problem list.  She is married, 2 children, 5 grandchildren. She is retired Medical sales representative work.   [name] is concerned about R flank lesion, scabbed, 6-8 months, growing.   She has hx of colon cancer in 2010, s/p resection and established with [name];  she went to [facility] for colonoscopy ? May 2022 but not full prep, did remove some polyps, was recommended 1 year follow up.    she has  a diagnosis of depression and is currently on celexa 20 mg daily daily for mood and doing well. Also on seroquel and finds this helpful for sleep, failed trazodone and gabeptnin. She has been on xanax, takes 0.25 mg as needed for sleep a few days a week.   She continues to smoke, has cut back to <0.5 pack a day, but has 30+ year smoking history, since 1978. Denies  cough, dyspnea.  She had CT lung cancer screening 12/2019 without concerning nodule but suggestive of small airway disease. Will schedule -    BMI is Body mass index is 29.96 kg/m., she has not been working on diet and exercise. She has been working on diet, she is walking doing stairs a few days a week, 10-15 min.  She is still drinking a some diet soda daily (3 x 12 oz), admits minimal water.  She admits eating irregular, some times only 1 meal a day, typically has sandwich with multigrain bread, crackers, etc, and will eat fruit.  Wt Readings from Last 3 Encounters:  07/14/21 163 lb 12.8 oz (74.3 kg)  07/07/21 164 lb 3.2 oz (74.5 kg)  04/02/21 163 lb (73.9 kg)   Has aortic atherosclerosis and 3 vessel CAD per CT 11/2017, 11/2019.  CT chest showed dilated pulm arteries; underwent ECHO 03/06/2020 which showed normal pulmonary artery pressure, no atypical dilation, no hypertrophy. Did incidentally note mitral valve has moderate to severe calcification (hardening) - no regurgitation or noted stenosis. Also known 3 vessel CAD per CT chest. She was referred to Dr. Gwenlyn Found and pending CT coronary calcium and carotid US but patient ultimately declined.   She does have BP cuff, 120s/70s at home is BP: 120/64 She does workout. She denies chest pain, shortness of breath, dizziness.   She is on cholesterol medication (rosuvastatin 40 mg daily and fenofibrate - hasn't taken, Dr. Gwenlyn Found considering PCSK9i pending CT coronary calcium) and denies myalgias. Her cholesterol is not at goal. The cholesterol last visit was:   Lab Results  Component Value Date   CHOL 245 (H) 04/02/2021   HDL 31 (L) 04/02/2021   LDLCALC 162 (H) 04/02/2021   TRIG 335 (H) 04/02/2021   CHOLHDL 7.9 (H) 04/02/2021   She has had diabetes for 10 + years.   She has been working on diet (limiting sweets) for T2 diabetes with CKD, no longer taking on metformin (abdominal pain and nausesa with 1 tab) - Taking glimepiride 4 mg BID  daily Didn't do well with SGLT2i (was given numerous samples, reports had persistent yeast while taking) and denies foot ulcerations, increased appetite, nausea, paresthesia of the feet, polydipsia, polyuria, visual disturbances and weight loss.  Patient does NOT check glucose - needle phobia and adamantly declines, insulin Admittedly poorly compliant with lifestyle and medications due to intolerance, typically GI sx She newly on truicity 0.75 mg weekly x 3 weeks in, has constipation, taking miralax Last A1C in the office was:  Lab Results  Component Value Date   HGBA1C 9.1 (H) 04/02/2021   She has CKD II associated with T2DM monitored at this office, no microalbuminuria, not on ACEi/ARB per patient preference. Last GFR:  Lab Results  Component Value Date   GFRNONAA 78 06/24/2020   Patient is on Vitamin D supplement, 5000 IU gel, when she remembers  Lab Results  Component Value Date   VD25OH 15 (L) 12/24/2020      she has had unexplained leukocytosis and microcytosis documented since 2010.  Etiology remains unclear with possible  myeloproliferative disorder and was referred back to Oncology for evaluation and was felt to be stable in 02/18/2021 by [name], was recommended monitoring and follow up only if trending up.  CBC Latest Ref Rng & Units 04/02/2021 12/24/2020 06/24/2020  WBC 3.8 - 10.8 Thousand/uL 18.4(H) 15.0(H) 16.7(H)  Hemoglobin 11.7 - 15.5 g/dL 13.1 12.2 12.8  Hematocrit 35.0 - 45.0 % 42.1 39.5 40.9  Platelets 140 - 400 Thousand/uL 501(H) 448(H) 468(H)   Lab Results  Component Value Date   VITAMINB12 324 03/13/2020   Lab Results  Component Value Date   IRON 86 11/03/2017   TIBC 334 11/03/2017   FERRITIN 130 11/03/2017       Medication Review: Current Outpatient Medications on File Prior to Visit  Medication Sig Dispense Refill   ALPRAZolam (XANAX) 0.5 MG tablet TAKE 1/2 TO 1 (ONE-HALF TO ONE) TABLET BY MOUTH ONCE DAILY AT BEDTIME AS NEEDED FOR ANXIETY FOR SLEEP  30 tablet 0   CHOLECALCIFEROL PO Take 5,000 Units by mouth daily.     citalopram (CELEXA) 20 MG tablet Take 1 tablet by mouth once daily 90 tablet 3   diclofenac Sodium (VOLTAREN) 1 % GEL Apply 1 application topically 4 (four) times daily as needed.     Dulaglutide (TRULICITY) 5.95 GL/8.7FI SOPN Inject 0.75 mg into the skin once a week. 2 mL 0   glimepiride (AMARYL) 4 MG tablet TAKE 1 TABLET BY MOUTH TWICE DAILY WITH A MEAL 180 tablet 0   metFORMIN (GLUCOPHAGE-XR) 500 MG 24 hr tablet Take 500 mg by mouth daily with supper.     nystatin cream (MYCOSTATIN) Apply 1 application topically 2 (two) times daily. 30 g 1   promethazine (PHENERGAN) 25 MG tablet Take 1 tablet (25 mg total) by mouth every 6 (six) hours as needed for nausea or vomiting. 30 tablet 3   QUEtiapine (SEROQUEL) 25 MG tablet TAKE 1 TABLET BY MOUTH AT BEDTIME AS NEEDED FOR SLEEP 90 tablet 0   triamcinolone ointment (KENALOG) 0.1 % Apply 1 application topically 2 (two) times daily. 80 g 1   No current facility-administered medications on file prior to visit.    Allergies  Allergen Reactions   Codeine    Glipizide Nausea And Vomiting   Lunesta [Eszopiclone]     Excessive fatigue   Metformin And Related     Nausea and abdominal pain   Welchol [Colesevelam Hcl]     nausea      Current Problems (verified) Patient Active Problem List   Diagnosis Date Noted   Diabetic peripheral neuropathy associated with type 2 diabetes mellitus ([place]) 07/14/2021   History of colon cancer 07/10/2021   Calcification of abdominal aorta ([place]) 07/02/2020   Adrenal adenoma, left 07/02/2020   Medication nonadherence due to intolerance 06/25/2020   LAFB (left anterior fascicular block) 06/24/2020   3-vessel CAD (per CT chest 11/2019) 06/24/2020   Constipation 06/24/2020   Mitral valve annular calcification 03/07/2020   Poor compliance 12/07/2019   Aortic atherosclerosis ([place]) 08/01/2019   CKD stage 2 due to type 2 diabetes mellitus ([place])  07/20/2019   Tobacco abuse 04/16/2019   Depression, major, recurrent, in partial remission ([place]) 01/10/2019   Arthralgia of both feet 10/04/2018   Overweight (BMI 25.0-29.9) 09/29/2017   Hyperlipidemia associated with type 2 diabetes mellitus ([place])    Diabetes ([place])    Vitamin D deficiency    Anxiety    Leukocytosis 09/18/2012    Screening Tests Immunization History  Administered Date(s) Administered   Pneumococcal  Polysaccharide-23 01/07/2020   Health Maintenance  Topic Date Due   COLONOSCOPY (Pts 45-51yrs Insurance coverage will need to be confirmed)  Never done   OPHTHALMOLOGY EXAM  09/20/2020   URINE MICROALBUMIN  06/24/2021   COVID-19 Vaccine (1) 07/30/2021 (Originally 09/11/1956)   INFLUENZA VACCINE  08/28/2021 (Originally 12/29/2020)   Zoster Vaccines- Shingrix (1 of 2) 10/11/2021 (Originally 03/12/1975)   Pneumonia Vaccine 49+ Years old (2 - PCV) 04/02/2022 (Originally 01/06/2021)   DEXA SCAN  04/02/2022 (Originally 03/13/2019)   HEMOGLOBIN A1C  09/30/2021   FOOT EXAM  07/14/2022   MAMMOGRAM  07/01/2023   Hepatitis C Screening  Completed   HPV VACCINES  Aged Out   TETANUS/TDAP  Discontinued     Preventative care: Last colonoscopy: 2010 Dr. Earlean Shawl, hx of colon cancer, patient [facility] 2022 Last mammogram: [facility], Cat A Last pap smear/pelvic exam: 05/2013, hx of abnormal but had normal x 2 last checks, declines further DEXA: ordered but patient declines to schedule at this time   Declines all vaccines   Names of Other Physician/Practitioners you currently use: 1. [facility] here for primary care 2. [facility], eye doctor, last visit 08/2019, reminded to schedule this year 3. Dental works, [employer], Pharmacist, community, last visit 2020, declines follow up, has full dentures  Patient Care Team: Unk Pinto, MD as PCP - General (Internal Medicine) Wyatt Portela, MD as Consulting Physician (Oncology)  SURGICAL  HISTORY She  has a past surgical history that includes Appendectomy; Partial colectomy (2010); Laparoscopic cholecystectomy (2010); Colonoscopy; and Breast biopsy (Left, 06/10/2016). FAMILY HISTORY Her family history includes Cancer - Ovarian in her mother; Diabetes in her paternal grandmother and sister; Gastric cancer in her paternal aunt; Heart disease in her brother; Hyperlipidemia in her mother and sister; Hypertension in her mother. SOCIAL HISTORY She  reports that she has been smoking cigarettes. She started smoking about 45 years ago. She has a 33.00 pack-year smoking history. She has never used smokeless tobacco. She reports that she does not drink alcohol and does not use drugs.     Review of Systems  Constitutional:  Negative for malaise/fatigue and weight loss.  HENT:  Negative for hearing loss and tinnitus.   Eyes:  Negative for blurred vision and double vision.  Respiratory:  Negative for cough, sputum production, shortness of breath and wheezing.   Cardiovascular:  Negative for chest pain, palpitations, orthopnea, claudication, leg swelling and PND.  Gastrointestinal:  Positive for constipation (chronic). Negative for abdominal pain, blood in stool, diarrhea, heartburn, melena, nausea and vomiting.  Genitourinary: Negative.   Musculoskeletal:  Negative for falls (1 fall in 12 months ), joint pain (bil feet, improved) and myalgias.  Skin:  Negative for rash.  Neurological:  Negative for dizziness, tingling, sensory change, weakness and headaches.  Endo/Heme/Allergies:  Negative for polydipsia.  Psychiatric/Behavioral:  Negative for depression, memory loss, substance abuse and suicidal ideas. The patient is not nervous/anxious and does not have insomnia.   All other systems reviewed and are negative.   Objective:     Today's Vitals   07/14/21 1507  BP: 120/64  Pulse: 95  Temp: (!) 97.5 F (36.4 C)  SpO2: 99%  Weight: 163 lb 12.8 oz (74.3 kg)  Height: 5\' 2"  (1.575 m)    Body mass index is 29.96 kg/m.  General appearance: alert, no distress, WD/WN, female HEENT: normocephalic, sclerae anicteric, TMs pearly, nares patent, no discharge or erythema, pharynx normal Oral cavity: MMM, no lesions Neck:  supple, no lymphadenopathy, no thyromegaly, no masses Heart: RRR, normal S1, S2, no murmurs  Lungs: CTA bilaterally, no wheezes, rhonchi, or rales Abdomen: +bs, firm, mildly distended, generalized tenderness with light palpation, no distinct palpable masses or organomegaly; tenderness limiting exam Musculoskeletal: nontender, no swelling, no obvious deformity Extremities: no edema, no cyanosis, no clubbing Pulses: 2+ symmetric, upper and lower extremities, normal cap refill Neurological: alert, oriented x 3, CN2-12 intact, strength normal upper extremities and lower extremities, sensation extremely diminished to monofilament bil feet and ankles, DTRs 2+ throughout, no cerebellar signs, gait slow steady Psychiatric: normal affect, behavior normal, pleasant  Breast: UTD mammogram, continue annually, no concerns today  Derm: warm, dry without rash, R flank with 8 mm x 5 mm raised lesion with local tenderness, erythematous base, see photo below   EKG: just had by cardiology, defer      Izora Ribas, NP   07/14/2021

## 2021-07-13 ENCOUNTER — Ambulatory Visit (HOSPITAL_COMMUNITY)
Admission: RE | Admit: 2021-07-13 | Payer: Medicare Other | Source: Ambulatory Visit | Attending: Cardiovascular Disease | Admitting: Cardiovascular Disease

## 2021-07-14 ENCOUNTER — Encounter: Payer: Self-pay | Admitting: Adult Health

## 2021-07-14 ENCOUNTER — Other Ambulatory Visit: Payer: Self-pay

## 2021-07-14 ENCOUNTER — Ambulatory Visit (INDEPENDENT_AMBULATORY_CARE_PROVIDER_SITE_OTHER): Payer: Medicare Other | Admitting: Adult Health

## 2021-07-14 VITALS — BP 120/64 | HR 95 | Temp 97.5°F | Ht 62.0 in | Wt 163.8 lb

## 2021-07-14 DIAGNOSIS — E1122 Type 2 diabetes mellitus with diabetic chronic kidney disease: Secondary | ICD-10-CM

## 2021-07-14 DIAGNOSIS — Z122 Encounter for screening for malignant neoplasm of respiratory organs: Secondary | ICD-10-CM

## 2021-07-14 DIAGNOSIS — E538 Deficiency of other specified B group vitamins: Secondary | ICD-10-CM

## 2021-07-14 DIAGNOSIS — I2510 Atherosclerotic heart disease of native coronary artery without angina pectoris: Secondary | ICD-10-CM

## 2021-07-14 DIAGNOSIS — Z85038 Personal history of other malignant neoplasm of large intestine: Secondary | ICD-10-CM

## 2021-07-14 DIAGNOSIS — E559 Vitamin D deficiency, unspecified: Secondary | ICD-10-CM

## 2021-07-14 DIAGNOSIS — Z0001 Encounter for general adult medical examination with abnormal findings: Secondary | ICD-10-CM

## 2021-07-14 DIAGNOSIS — E1142 Type 2 diabetes mellitus with diabetic polyneuropathy: Secondary | ICD-10-CM | POA: Diagnosis not present

## 2021-07-14 DIAGNOSIS — D649 Anemia, unspecified: Secondary | ICD-10-CM

## 2021-07-14 DIAGNOSIS — F419 Anxiety disorder, unspecified: Secondary | ICD-10-CM

## 2021-07-14 DIAGNOSIS — D72829 Elevated white blood cell count, unspecified: Secondary | ICD-10-CM

## 2021-07-14 DIAGNOSIS — E1169 Type 2 diabetes mellitus with other specified complication: Secondary | ICD-10-CM | POA: Diagnosis not present

## 2021-07-14 DIAGNOSIS — Z79899 Other long term (current) drug therapy: Secondary | ICD-10-CM

## 2021-07-14 DIAGNOSIS — I700 Atherosclerosis of aorta: Secondary | ICD-10-CM

## 2021-07-14 DIAGNOSIS — E663 Overweight: Secondary | ICD-10-CM

## 2021-07-14 DIAGNOSIS — Z87891 Personal history of nicotine dependence: Secondary | ICD-10-CM

## 2021-07-14 DIAGNOSIS — N182 Chronic kidney disease, stage 2 (mild): Secondary | ICD-10-CM

## 2021-07-14 DIAGNOSIS — Z720 Tobacco use: Secondary | ICD-10-CM

## 2021-07-14 DIAGNOSIS — D492 Neoplasm of unspecified behavior of bone, soft tissue, and skin: Secondary | ICD-10-CM

## 2021-07-14 DIAGNOSIS — E785 Hyperlipidemia, unspecified: Secondary | ICD-10-CM

## 2021-07-14 DIAGNOSIS — F3341 Major depressive disorder, recurrent, in partial remission: Secondary | ICD-10-CM

## 2021-07-14 DIAGNOSIS — K5900 Constipation, unspecified: Secondary | ICD-10-CM

## 2021-07-14 MED ORDER — LACTULOSE 10 GM/15ML PO SOLN: 15.0000 g | Freq: Every day | ORAL | 0 refills | Status: DC | PRN

## 2021-07-14 MED ORDER — LACTULOSE 10 GM/15ML PO SOLN: ORAL | 0 refills | Status: DC

## 2021-07-14 MED ORDER — ROSUVASTATIN CALCIUM 40 MG PO TABS: 40.0000 mg | ORAL_TABLET | Freq: Every day | ORAL | 1 refills | Status: DC

## 2021-07-14 NOTE — Patient Instructions (Addendum)
°  Please schedule diabetes eye exam and have them send me the report  If you don't hear from [facility] for colonoscopy by May please let me know  Please restart rosuvastatin - start with every other day, then if tolerating well increase to daily. Ok to stop fenofibrate for now.     Miralax with 2 tall glasses of fluids with it - will not work without the fluid   Can do up to every 2 hours if haven't had a BM in over 5 days   Magnesium citrate - 1/2 bottle with 2 glasses of water, repeat in 4 hour  Consider adding a fiber supplements - citrucel, benefiber, metamucil

## 2021-07-15 ENCOUNTER — Encounter: Payer: Self-pay | Admitting: Internal Medicine

## 2021-07-15 ENCOUNTER — Other Ambulatory Visit: Payer: Self-pay | Admitting: Adult Health

## 2021-07-15 DIAGNOSIS — E538 Deficiency of other specified B group vitamins: Secondary | ICD-10-CM

## 2021-07-15 LAB — LIPID PANEL
Cholesterol: 202 mg/dL — ABNORMAL HIGH (ref <200)
HDL: 36 mg/dL — ABNORMAL LOW (ref >=50)
LDL Cholesterol (Calc): 126 mg/dL (calc) — ABNORMAL HIGH
Non-HDL Cholesterol (Calc): 166 mg/dL (calc) — ABNORMAL HIGH (ref <130)
Total CHOL/HDL Ratio: 5.6 (calc) — ABNORMAL HIGH (ref <5.0)
Triglycerides: 257 mg/dL — ABNORMAL HIGH (ref <150)

## 2021-07-15 LAB — COMPLETE METABOLIC PANEL WITH GFR
AG Ratio: 1.8 (calc) (ref 1.0–2.5)
ALT: 15 U/L (ref 6–29)
AST: 14 U/L (ref 10–35)
Albumin: 4.9 g/dL (ref 3.6–5.1)
Alkaline phosphatase (APISO): 69 U/L (ref 37–153)
BUN: 16 mg/dL (ref 7–25)
CO2: 29 mmol/L (ref 20–32)
Calcium: 10.4 mg/dL (ref 8.6–10.4)
Chloride: 99 mmol/L (ref 98–110)
Creat: 1.01 mg/dL (ref 0.50–1.05)
Globulin: 2.7 g/dL (calc) (ref 1.9–3.7)
Glucose, Bld: 136 mg/dL — ABNORMAL HIGH (ref 65–99)
Potassium: 4.6 mmol/L (ref 3.5–5.3)
Sodium: 138 mmol/L (ref 135–146)
Total Bilirubin: 0.5 mg/dL (ref 0.2–1.2)
Total Protein: 7.6 g/dL (ref 6.1–8.1)
eGFR: 61 mL/min/{1.73_m2} (ref >=60)

## 2021-07-15 LAB — CBC WITH DIFFERENTIAL/PLATELET
Absolute Monocytes: 811 cells/uL (ref 200–950)
Basophils Absolute: 116 cells/uL (ref 0–200)
Basophils Relative: 0.6 %
Eosinophils Absolute: 232 cells/uL (ref 15–500)
Eosinophils Relative: 1.2 %
HCT: 42.1 % (ref 35.0–45.0)
Hemoglobin: 13.1 g/dL (ref 11.7–15.5)
Lymphs Abs: 5964 cells/uL — ABNORMAL HIGH (ref 850–3900)
MCH: 19.2 pg — ABNORMAL LOW (ref 27.0–33.0)
MCHC: 31.1 g/dL — ABNORMAL LOW (ref 32.0–36.0)
MCV: 61.7 fL — ABNORMAL LOW (ref 80.0–100.0)
MPV: 9.7 fL (ref 7.5–12.5)
Monocytes Relative: 4.2 %
Neutro Abs: 12178 cells/uL — ABNORMAL HIGH (ref 1500–7800)
Neutrophils Relative %: 63.1 %
Platelets: 485 10*3/uL — ABNORMAL HIGH (ref 140–400)
RBC: 6.82 10*6/uL — ABNORMAL HIGH (ref 3.80–5.10)
RDW: 18.1 % — ABNORMAL HIGH (ref 11.0–15.0)
Total Lymphocyte: 30.9 %
WBC: 19.3 10*3/uL — ABNORMAL HIGH (ref 3.8–10.8)

## 2021-07-15 LAB — URINALYSIS, ROUTINE W REFLEX MICROSCOPIC
Bilirubin Urine: NEGATIVE
Glucose, UA: NEGATIVE
Hgb urine dipstick: NEGATIVE
Ketones, ur: NEGATIVE
Leukocytes,Ua: NEGATIVE
Nitrite: NEGATIVE
Protein, ur: NEGATIVE
Specific Gravity, Urine: 1.019 (ref 1.001–1.035)
pH: 7 (ref 5.0–8.0)

## 2021-07-15 LAB — MICROALBUMIN / CREATININE URINE RATIO
Creatinine, Urine: 117 mg/dL (ref 20–275)
Microalb Creat Ratio: 14 mcg/mg creat (ref <30)
Microalb, Ur: 1.6 mg/dL

## 2021-07-15 LAB — MAGNESIUM: Magnesium: 2.3 mg/dL (ref 1.5–2.5)

## 2021-07-15 LAB — VITAMIN D 25 HYDROXY (VIT D DEFICIENCY, FRACTURES): Vit D, 25-Hydroxy: 42 ng/mL (ref 30–100)

## 2021-07-15 LAB — IRON,TIBC AND FERRITIN PANEL
%SAT: 19 % (calc) (ref 16–45)
Ferritin: 74 ng/mL (ref 16–288)
Iron: 85 ug/dL (ref 45–160)
TIBC: 455 mcg/dL (calc) — ABNORMAL HIGH (ref 250–450)

## 2021-07-15 LAB — TSH: TSH: 4.01 mIU/L (ref 0.40–4.50)

## 2021-07-15 LAB — HEMOGLOBIN A1C
Hgb A1c MFr Bld: 8.8 % of total Hgb — ABNORMAL HIGH (ref <5.7)
Mean Plasma Glucose: 206 mg/dL
eAG (mmol/L): 11.4 mmol/L

## 2021-07-15 LAB — VITAMIN B12: Vitamin B-12: 331 pg/mL (ref 200–1100)

## 2021-07-15 MED ORDER — ASPIRIN EC 81 MG PO TBEC: 81.0000 mg | DELAYED_RELEASE_TABLET | Freq: Every day | ORAL | 0 refills | Status: DC

## 2021-07-17 ENCOUNTER — Ambulatory Visit (HOSPITAL_COMMUNITY): Payer: Medicare Other

## 2021-07-22 NOTE — Telephone Encounter (Signed)
ROI request sent to patient

## 2021-07-27 ENCOUNTER — Other Ambulatory Visit: Payer: Self-pay | Admitting: Adult Health

## 2021-08-06 ENCOUNTER — Inpatient Hospital Stay (HOSPITAL_COMMUNITY): Admission: RE | Admit: 2021-08-06 | Payer: Medicare Other | Source: Ambulatory Visit

## 2021-08-12 ENCOUNTER — Ambulatory Visit (HOSPITAL_COMMUNITY): Payer: Medicare Other

## 2021-08-28 ENCOUNTER — Inpatient Hospital Stay: Admission: RE | Admit: 2021-08-28 | Payer: Self-pay | Source: Ambulatory Visit

## 2021-09-02 ENCOUNTER — Ambulatory Visit (HOSPITAL_COMMUNITY)
Admission: RE | Admit: 2021-09-02 | Payer: Medicare Other | Source: Ambulatory Visit | Attending: Cardiovascular Disease | Admitting: Cardiovascular Disease

## 2021-09-11 ENCOUNTER — Other Ambulatory Visit: Payer: Self-pay | Admitting: Adult Health

## 2021-09-11 DIAGNOSIS — F3341 Major depressive disorder, recurrent, in partial remission: Secondary | ICD-10-CM

## 2021-09-18 ENCOUNTER — Encounter: Payer: Self-pay | Admitting: Internal Medicine

## 2021-09-18 ENCOUNTER — Encounter: Payer: Self-pay | Admitting: Adult Health

## 2021-09-18 DIAGNOSIS — Z86010 Personal history of colonic polyps: Secondary | ICD-10-CM | POA: Insufficient documentation

## 2021-09-18 DIAGNOSIS — Z860101 Personal history of adenomatous and serrated colon polyps: Secondary | ICD-10-CM | POA: Insufficient documentation

## 2021-10-15 ENCOUNTER — Encounter: Payer: Self-pay | Admitting: Adult Health

## 2021-10-15 ENCOUNTER — Ambulatory Visit (INDEPENDENT_AMBULATORY_CARE_PROVIDER_SITE_OTHER): Payer: Medicare Other | Admitting: Adult Health

## 2021-10-15 VITALS — BP 112/68 | HR 93 | Temp 97.5°F | Wt 162.0 lb

## 2021-10-15 DIAGNOSIS — Z85038 Personal history of other malignant neoplasm of large intestine: Secondary | ICD-10-CM

## 2021-10-15 DIAGNOSIS — E1122 Type 2 diabetes mellitus with diabetic chronic kidney disease: Secondary | ICD-10-CM

## 2021-10-15 DIAGNOSIS — N182 Chronic kidney disease, stage 2 (mild): Secondary | ICD-10-CM

## 2021-10-15 DIAGNOSIS — I2510 Atherosclerotic heart disease of native coronary artery without angina pectoris: Secondary | ICD-10-CM

## 2021-10-15 DIAGNOSIS — E1169 Type 2 diabetes mellitus with other specified complication: Secondary | ICD-10-CM

## 2021-10-15 DIAGNOSIS — D72829 Elevated white blood cell count, unspecified: Secondary | ICD-10-CM

## 2021-10-15 DIAGNOSIS — E559 Vitamin D deficiency, unspecified: Secondary | ICD-10-CM

## 2021-10-15 DIAGNOSIS — I700 Atherosclerosis of aorta: Secondary | ICD-10-CM | POA: Diagnosis not present

## 2021-10-15 DIAGNOSIS — F3341 Major depressive disorder, recurrent, in partial remission: Secondary | ICD-10-CM

## 2021-10-15 DIAGNOSIS — E785 Hyperlipidemia, unspecified: Secondary | ICD-10-CM

## 2021-10-15 DIAGNOSIS — E663 Overweight: Secondary | ICD-10-CM

## 2021-10-15 DIAGNOSIS — Z86010 Personal history of colonic polyps: Secondary | ICD-10-CM

## 2021-10-15 DIAGNOSIS — Z794 Long term (current) use of insulin: Secondary | ICD-10-CM

## 2021-10-15 DIAGNOSIS — E1142 Type 2 diabetes mellitus with diabetic polyneuropathy: Secondary | ICD-10-CM

## 2021-10-15 DIAGNOSIS — Z720 Tobacco use: Secondary | ICD-10-CM

## 2021-10-15 DIAGNOSIS — K5900 Constipation, unspecified: Secondary | ICD-10-CM

## 2021-10-15 DIAGNOSIS — Z79899 Other long term (current) drug therapy: Secondary | ICD-10-CM

## 2021-10-15 IMAGING — US US BREAST*L* LIMITED INC AXILLA
1 series · 8 of 8 positions shown · non-contrast
Comparison: Previous exam(s).

CLINICAL DATA: 63-year-old female complaining of a left breast
infection. The lesion was I & D on 11/28/2019 with malodorous
puss, culture showed gram-positive cocci in clusters however without
significant growth. She was treated with a course of doxycycline
without resolution of the abscess.

EXAM:
DIGITAL DIAGNOSTIC BILATERAL MAMMOGRAM WITH CAD AND TOMO
ULTRASOUND LEFT BREAST

[Series 1: us breast*left* limited inc axilla · 0.05mm/px · 8 of 8 slices shown]
[im 1/8]
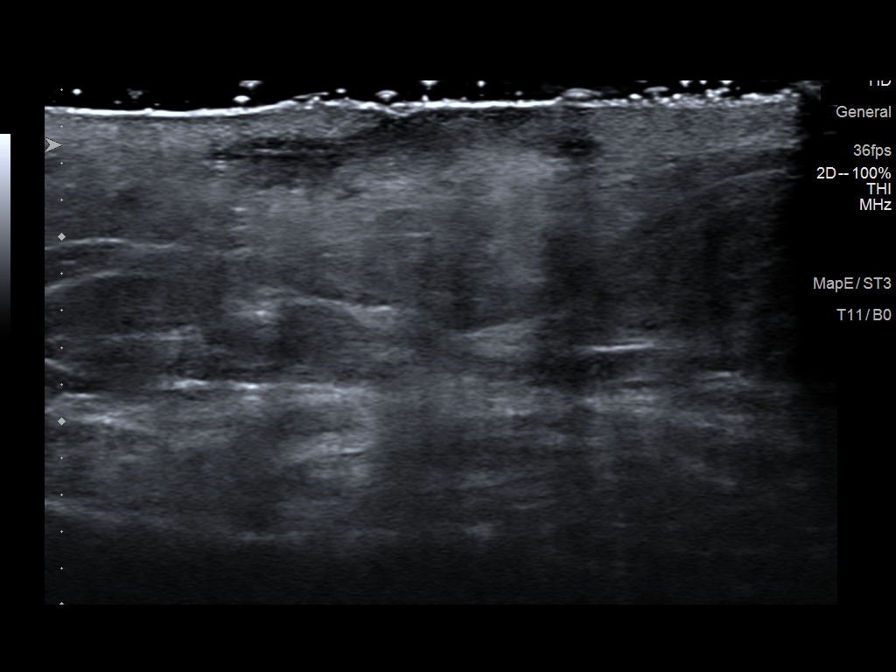
[im 2/8]
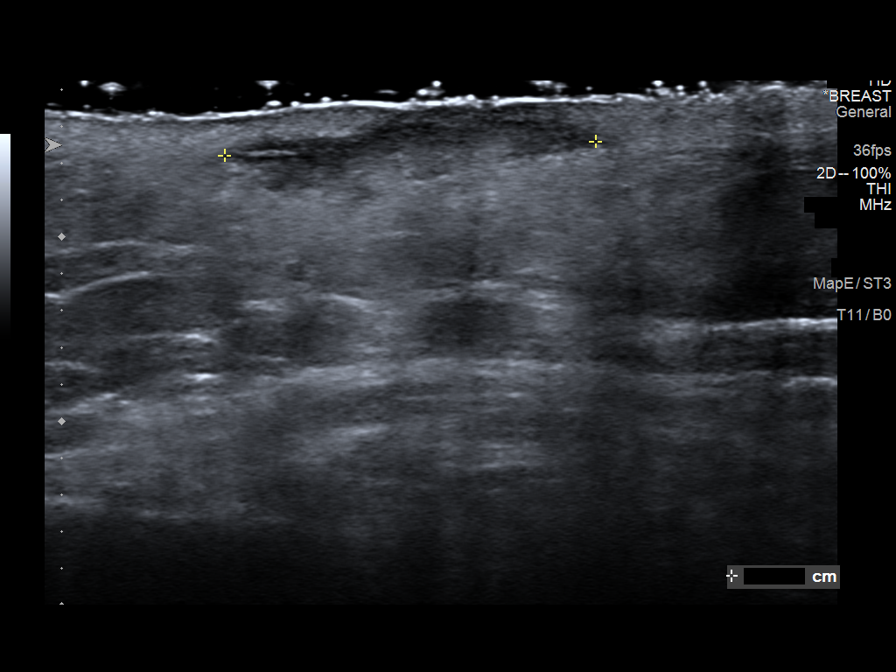
[im 3/8]
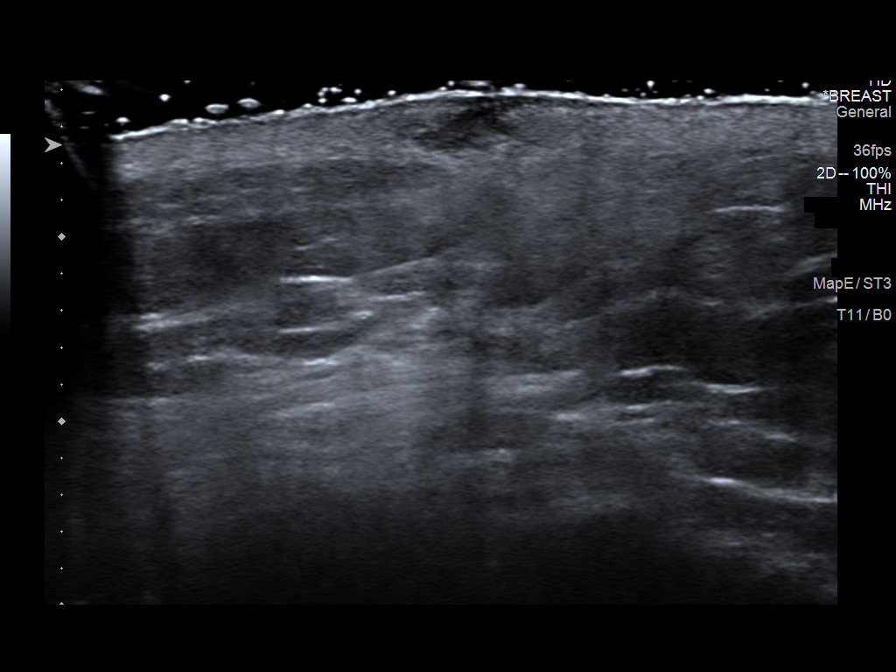
[im 4/8]
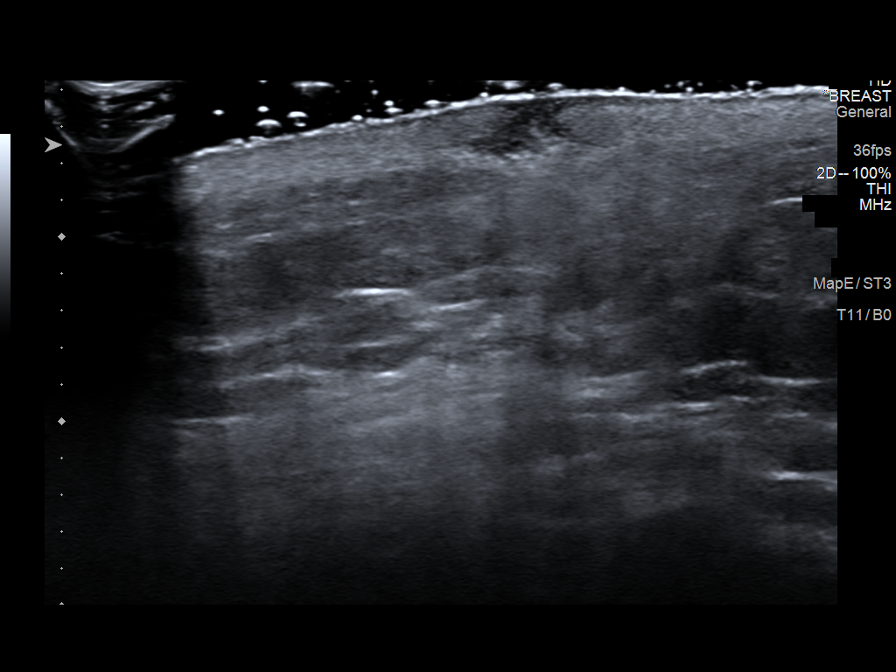
[im 5/8]
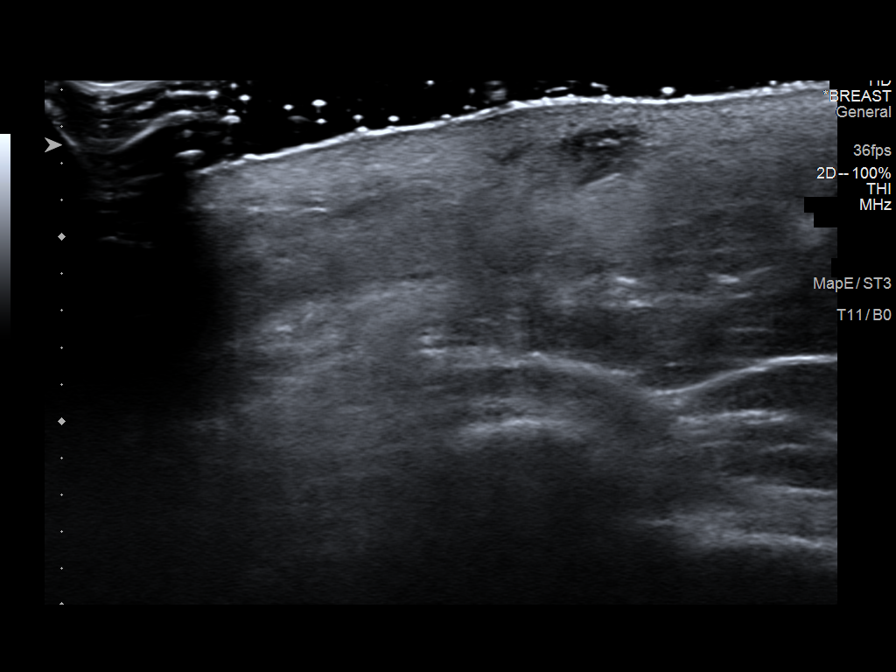
[im 6/8]
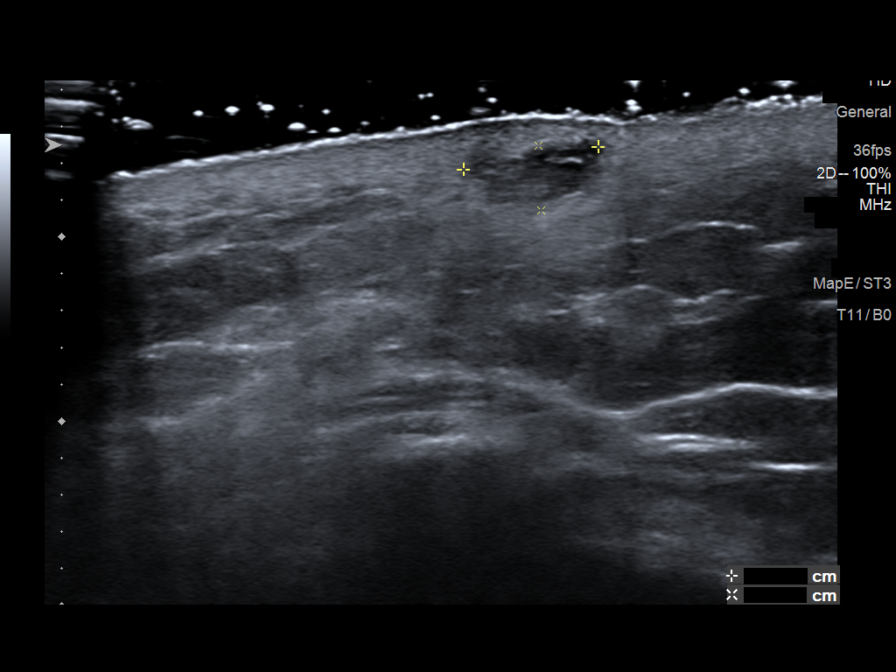
[im 7/8]
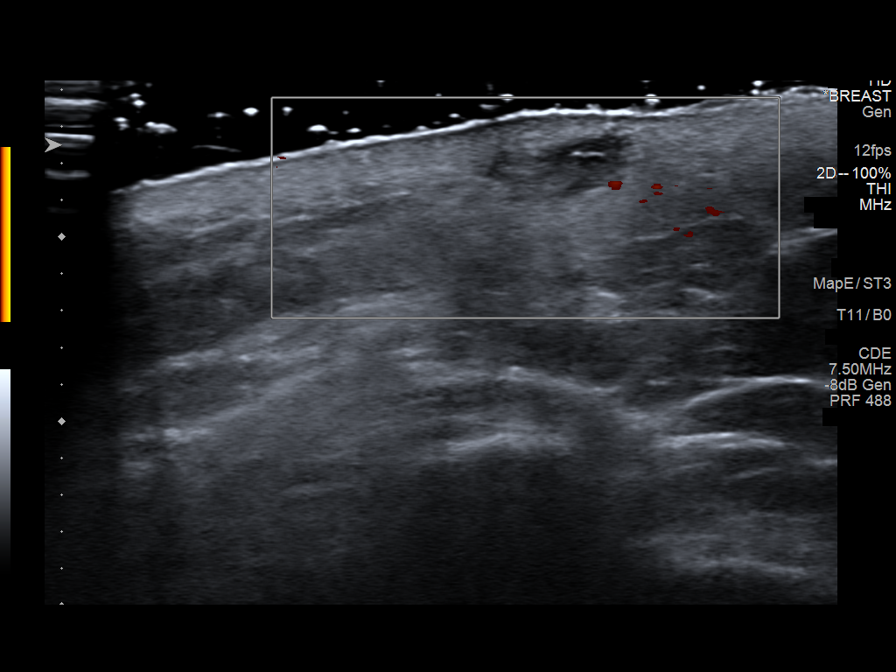
[im 8/8]
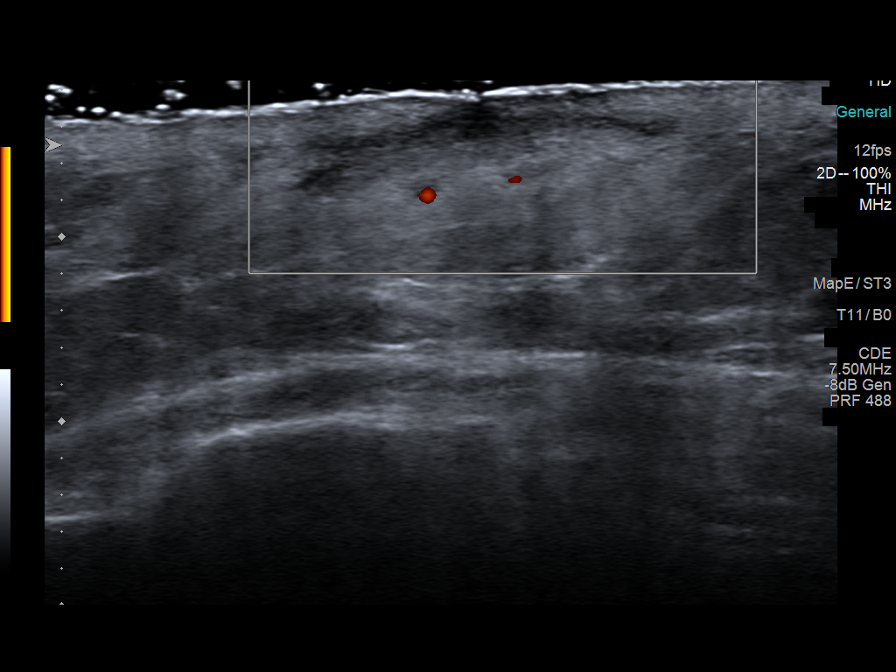

[8 of 8 positions shown; findings below may reference images not displayed]

ACR Breast Density Category b: There are scattered areas of
fibroglandular density.
FINDINGS: No suspicious mass, malignant type microcalcifications or distortion
detected in either breast.

Mammographic images were processed with CAD.

On physical exam, there is an open draining wound to the left of the
sternum at 10 o'clock 14 cm from the nipple.

Targeted ultrasound is performed, showing a hypoechoic mass in the
skin of the left breast at 10 o'clock 14 cm from the nipple
measuring 0.7 x 0.4 x 2.0 cm. No drainable fluid collection is
identified.
IMPRESSION: Infected sebaceous cyst or abscess in the 10 o'clock region of the
left breast 14 cm from the nipple. No evidence of malignancy in
either breast.

RECOMMENDATION:
Bilateral screening mammogram in 1 year is recommended.

Continued treatment of the left breast infected sebaceous cyst or
abscess is recommended. Surgical referral may be warranted. Findings
were discussed with Nazareth Jumper, NP.

I have discussed the findings and recommendations with the patient.
If applicable, a reminder letter will be sent to the patient
regarding the next appointment.

BI-RADS CATEGORY  2: Benign.

## 2021-10-15 IMAGING — MG DIGITAL DIAGNOSTIC BILAT W/ TOMO W/ CAD
8 series · 8 of 24 positions shown · non-contrast
Comparison: Previous exam(s).

CLINICAL DATA: 63-year-old female complaining of a left breast
infection. The lesion was I & D on 11/28/2019 with malodorous
puss, culture showed gram-positive cocci in clusters however without
significant growth. She was treated with a course of doxycycline
without resolution of the abscess.

EXAM:
DIGITAL DIAGNOSTIC BILATERAL MAMMOGRAM WITH CAD AND TOMO
ULTRASOUND LEFT BREAST

[L MLO synth-2D]
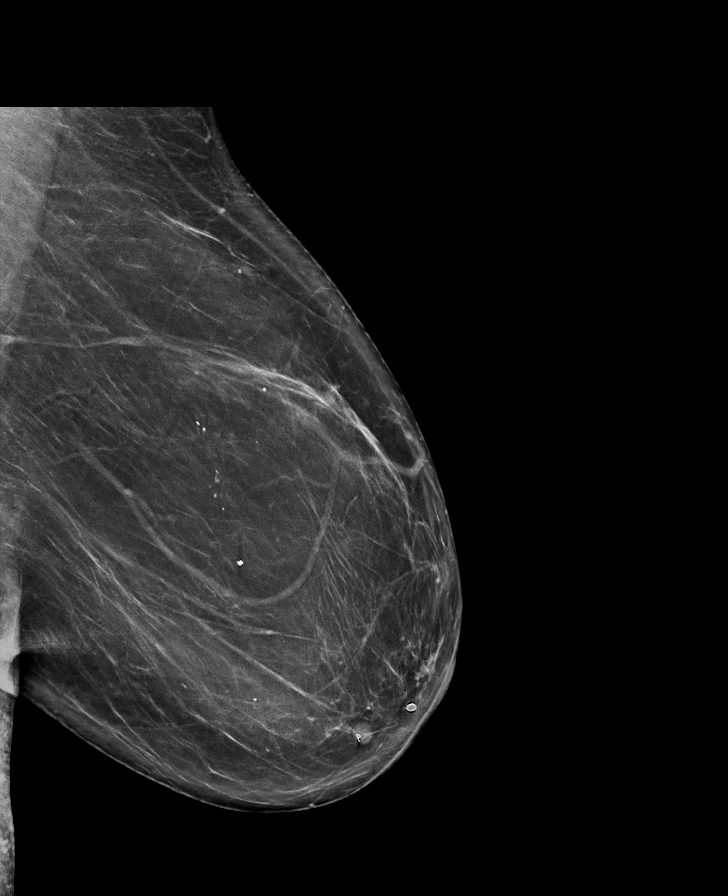

[R CC synth-2D]
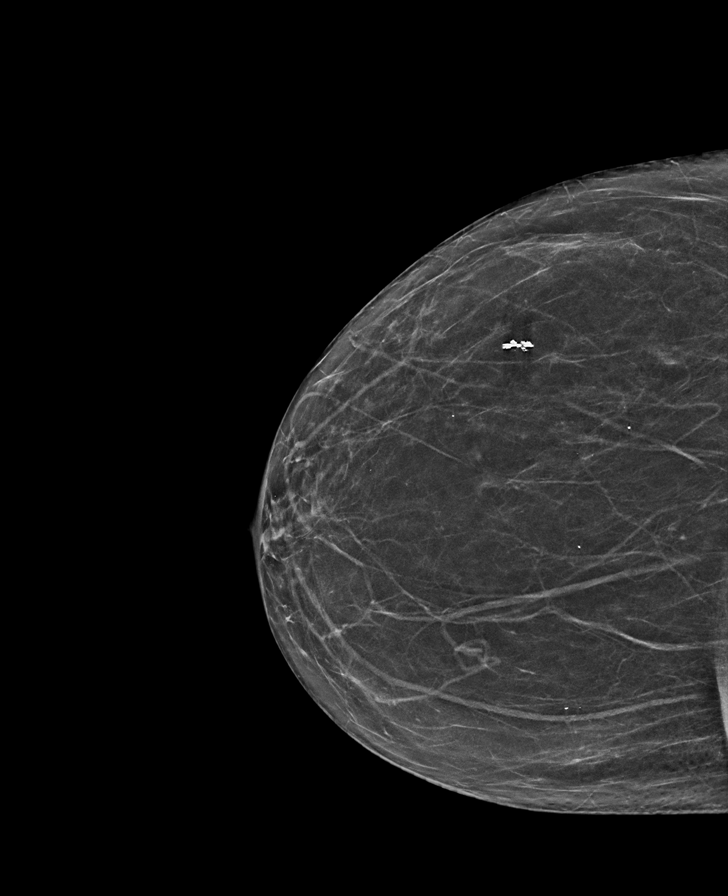

[R MLO synth-2D]
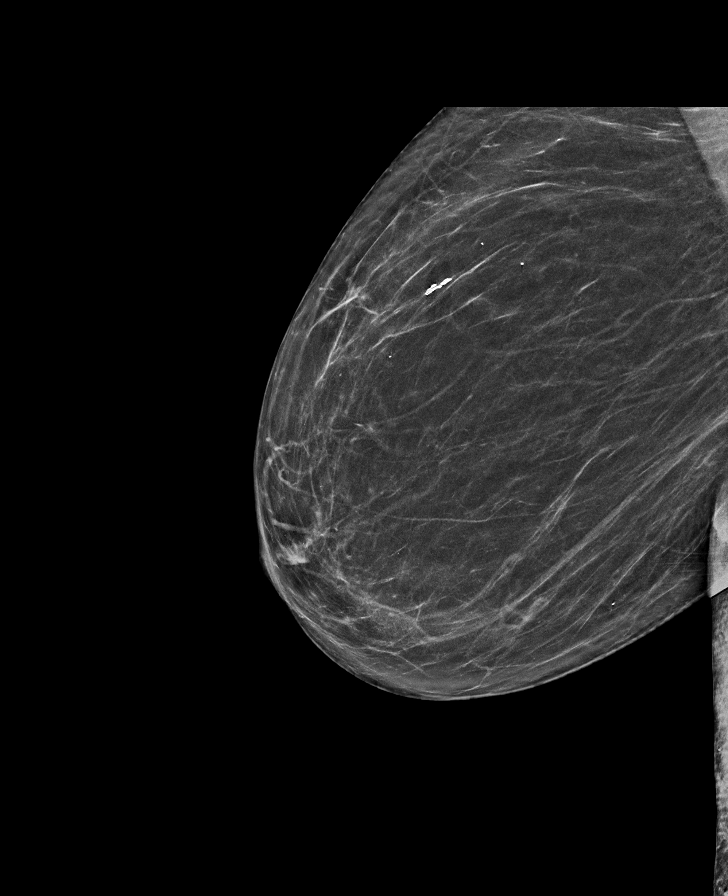

[L CC synth-2D]
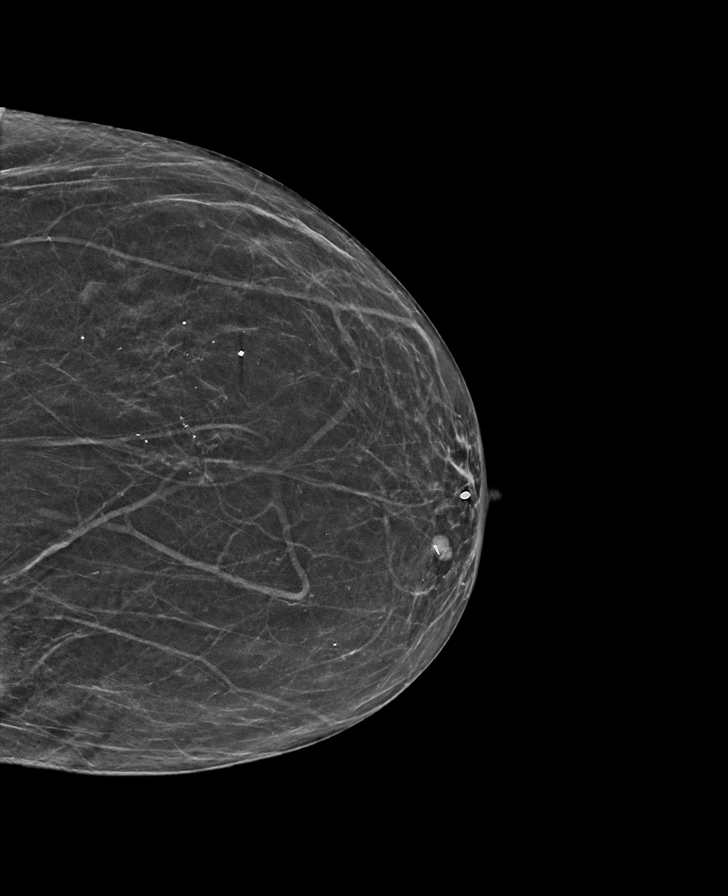

[R CC tomo · tomo slice 29/58.0]
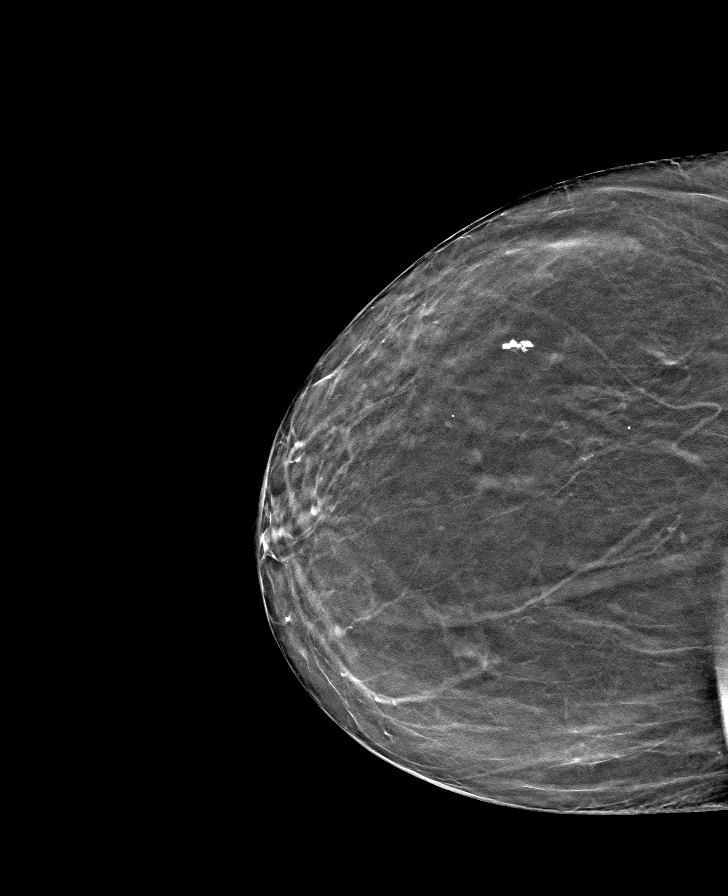

[L CC tomo · tomo slice 29/57.0]
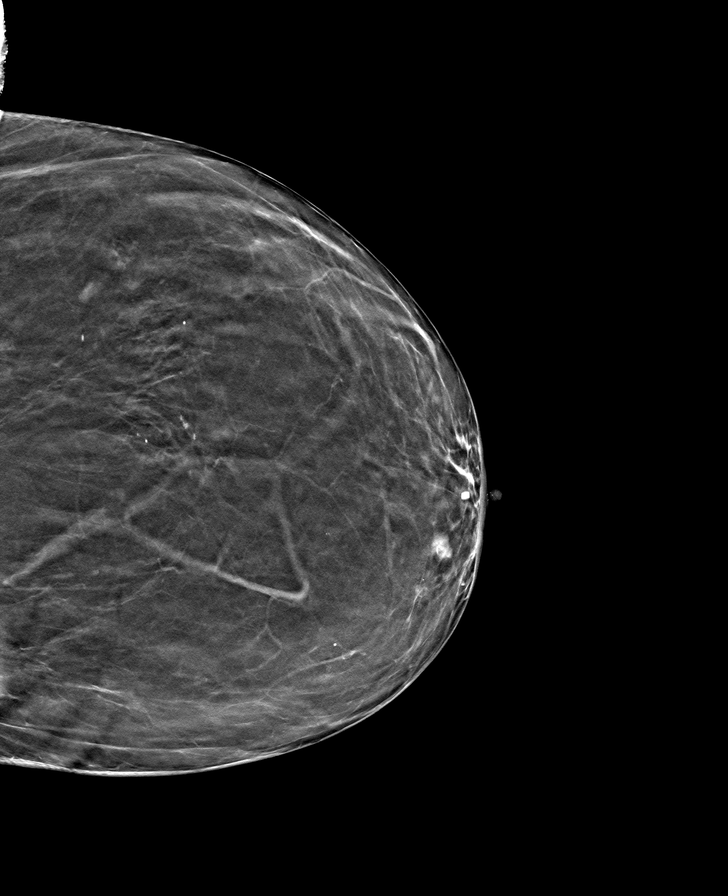

[L MLO tomo · tomo slice 39/76.0]
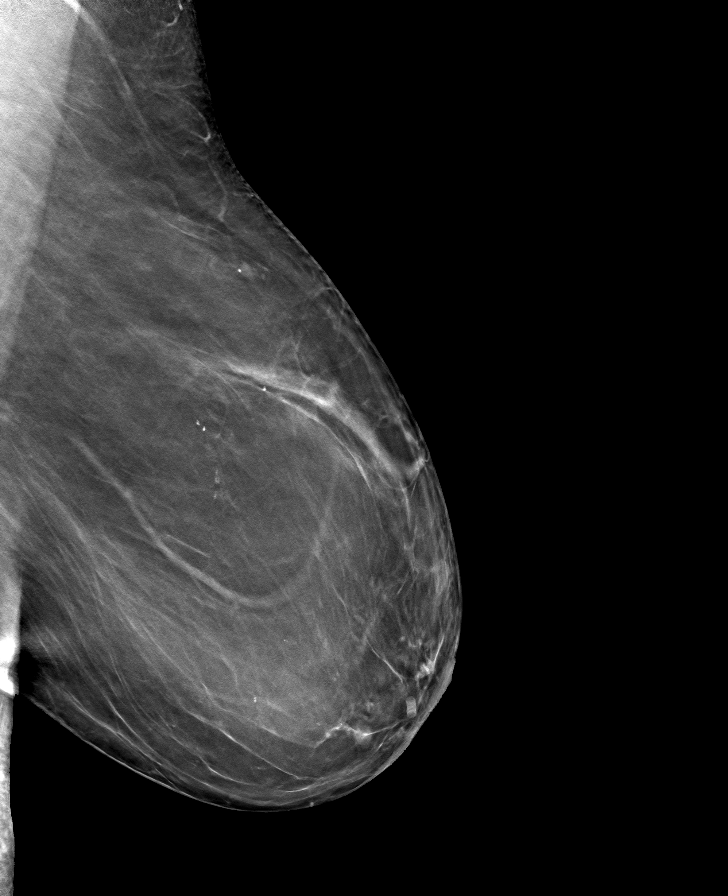

[R MLO tomo · tomo slice 35/68.0]
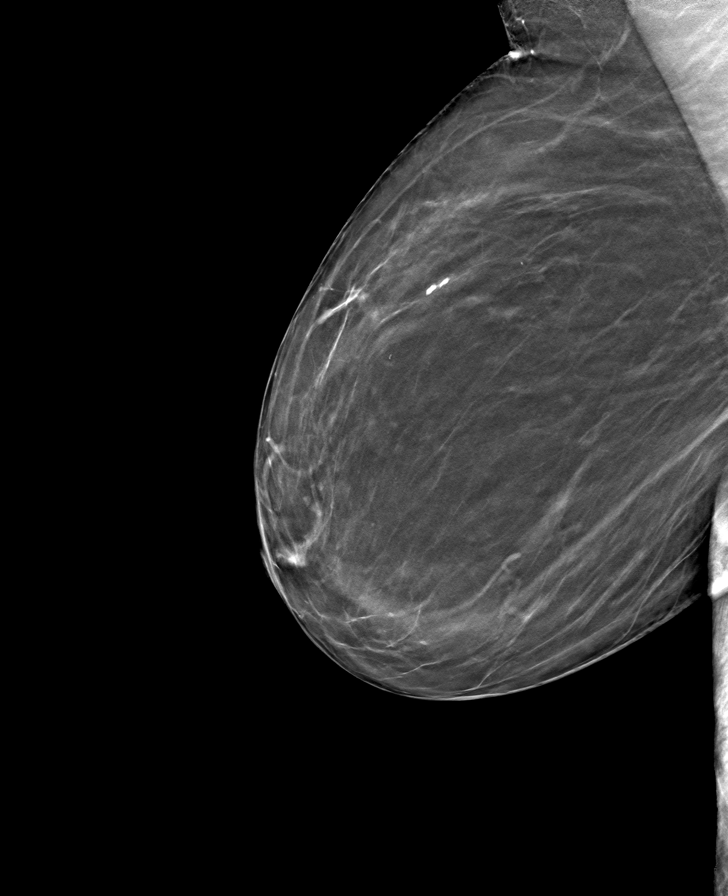

[8 of 24 positions shown; findings below may reference images not displayed]

ACR Breast Density Category b: There are scattered areas of
fibroglandular density.
FINDINGS: No suspicious mass, malignant type microcalcifications or distortion
detected in either breast.

Mammographic images were processed with CAD.

On physical exam, there is an open draining wound to the left of the
sternum at 10 o'clock 14 cm from the nipple.

Targeted ultrasound is performed, showing a hypoechoic mass in the
skin of the left breast at 10 o'clock 14 cm from the nipple
measuring 0.7 x 0.4 x 2.0 cm. No drainable fluid collection is
identified.
IMPRESSION: Infected sebaceous cyst or abscess in the 10 o'clock region of the
left breast 14 cm from the nipple. No evidence of malignancy in
either breast.

RECOMMENDATION:
Bilateral screening mammogram in 1 year is recommended.

Continued treatment of the left breast infected sebaceous cyst or
abscess is recommended. Surgical referral may be warranted. Findings
were discussed with Nazareth Jumper, NP.

I have discussed the findings and recommendations with the patient.
If applicable, a reminder letter will be sent to the patient
regarding the next appointment.

BI-RADS CATEGORY  2: Benign.

## 2021-10-15 MED ORDER — ONDANSETRON HCL 4 MG PO TABS: 4.0000 mg | ORAL_TABLET | Freq: Three times a day (TID) | ORAL | 1 refills | Status: DC | PRN

## 2021-10-15 MED ORDER — LACTULOSE 10 GM/15ML PO SOLN: ORAL | 3 refills | Status: DC

## 2021-10-15 NOTE — Patient Instructions (Signed)
   Try ozempic 0.25 mg weekly instead of trulicity -    Try nexium over the counter 1 cap daily for a few weeks and see if this helps cough, trouble swallowing or throwing up   Keep a log to see if you note a pattern   Discuss your stomach symptoms with [facility] -

## 2021-10-15 NOTE — Progress Notes (Signed)
3 MONTH FOLLOW UP  Assessment:    Aortic atherosclerosis ([place])  - Per CT 2019 Control blood pressure, cholesterol, glucose, increase exercise.  -     Lipid panel  3 Vessel CAD STOP SMOKING Denies angina Control blood pressure, cholesterol, glucose, increase exercise.  Recommend bASA, and statin  LDL goal <70 Has seen Dorothy Boyd, pending CT coronary calcium, she has declined to pursue at this time  Mitral valve annular calcificiation Denies concerning sx; stop smoking, control cholesterol and sugars Est with cardiology, monitoring only   History of colon cancer  Hx of, s/p resection, has seen Dorothy Boyd for colonoscopy in 2022, still no report but was recommended 1 year recall, will refer back    Hyperlipidemia associated with type 2 diabetes mellitus (Dorothy Boyd) Complicated by poor compliance and med SE Back on rosuvastatin 40 mg daily  LDL goal <70 Continue low cholesterol diet and exercise.  Check lipid panel.  -     Lipid panel -     TSH  Type 2 diabetes mellitus with stage 1 chronic kidney disease, with long-term current use of insulin ([name]) Type 2 Diabetes Mellitus - poorly controlled Complicated by non-compliance, med intolerance Intolerance: metformin, SGLT2i (numerous) ABSOLUTELY DECLINES INSULIN OR CHECKING GLUCOSE - fear of needles Maximized on glimepiride  She has been tolerating trulicity 2.68 mg - free with assistance program - wants to try ozempic for possible weight loss - given sample Lifestyle emphasized  Education: Reviewed 'ABCs' of diabetes management (respective goals in parentheses):  A1C (<7), blood pressure (<130/80), and cholesterol (LDL <70) Eye Exam yearly and Dental Exam every 6 months. Dietary recommendations Physical Activity recommendations -     COMPLETE METABOLIC PANEL WITH GFR -     Hemoglobin A1c  CKD stage 2 due to type 2 diabetes mellitus ([name]) Increase fluids, avoid NSAIDS, monitor sugars, will monitor  Diabetic peripheral  neuropathy ([name]) Declines medications at this time, emphasized improved glucose control, quit smoking, increase exercise, needs daily feet checks. Est with podiatry -   Depression, major, recurrent, in partial remission (Dorothy Boyd) Celexa 20 mg daily, seroquel, doing well  Follow up sooner if SE or other concerns, continue seroquel Lifestyle discussed: diet/exerise, sleep hygiene, stress management, hydration  Vitamin D deficiency Encouraged taking supplement consistently, check levels   Smoker Discussed risks associated with tobacco use and advised to reduce or quit Patient is not ready to do so, but advised to consider strongly Will follow up at the next visit - had CTA benign 07/2021, resume low dose CT screening 2024  Overweight (BMI 25.0-29.9) Continue to recommend diet heavy in fruits and veggies and low in animal meats, cheeses, and dairy products, appropriate calorie intake Discuss exercise recommendations routinely Continue to monitor weight at each visit  Leukocytosis, unspecified type Has recently seen Dorothy Boyd; monitoring q79mhere -     CBC with Differential/Platelet  Poor compliance Continue to discuss barriers, encourage compliance  Constipation Extensive GI hx  Improved per patient; refilled lactulose to take PRN Caution with constipating agents  Periodic mesis ? Unclear trigger, try adding nexium daily x 2 weeks and evaluate progress Will refer to GI as due for colonoscopy, ? Gastroparesis   Orders Placed This Encounter  Procedures   CBC with Differential/Platelet   COMPLETE METABOLIC PANEL WITH GFR   Magnesium   Lipid panel   TSH   Hemoglobin A1c   Ambulatory referral to Gastroenterology   Over 30 minutes of exam, counseling, chart review and critical decision making was  performed Future Appointments  Date Time Provider [place]  04/06/2022  4:00 PM Dorothy Comber, NP [facility] None  07/14/2022  3:00 PM Dorothy Comber, NP [facility] None     Subjective:  Dorothy Boyd is a 66 y.o. female who presents for 3 month follow up on F6EP with complications, hld, smoking, mood, weight.   [name] is concerned about R flank lesion, scabbed, 6-8 months, growing, was referred to [facility], had cryotherapy.   She has hx of colon cancer in 2010, s/p resection and established with Dorothy Boyd;  she went to [facility] for colonoscopy 09/01/2020 but not full prep, did remove 8 polyps, was recommended 1 year follow up, DUE.   She has a diagnosis of depression and is currently on celexa 20 mg daily daily for mood and doing well. Also on seroquel and finds this helpful for sleep, failed trazodone and gabeptnin. She has been on xanax, takes 0.25 mg as needed for sleep a few days a week.   She continues to smoke, has cut back to <0.5 pack a day, but has 30+ year smoking history, since 1978. Denies cough, dyspnea.  She had CT lung cancer screening 12/2019 without concerning nodule but suggestive of small airway disease. She is not interested in cutting back at this time.  She had low dose CT lung pending but had ED visit [facility]21 [facility] where they did CTA to r/o PE, showed no concerning masses or nodules.   BMI is Body mass index is 29.63 kg/m., she has not been working on diet and exercise.  She has been working on diet, she is walking doing stairs a few days a week, 10-15 min.  She is still drinking a some diet soda daily (3 x 12 oz), admits minimal water.  She admits eating irregular, some times only 1 meal a day, typically has sandwich with multigrain bread, crackers, etc, and will eat fruit.  Wt Readings from Last 3 Encounters:  10/15/21 162 lb (73.5 kg)  07/14/21 163 lb 12.8 oz (74.3 kg)  07/07/21 164 lb 3.2 oz (74.5 kg)   Has aortic atherosclerosis and 3 vessel CAD per CT 11/2017, 11/2019.  CT chest showed dilated pulm arteries; underwent ECHO 03/06/2020 which showed normal pulmonary artery pressure, no atypical dilation, no  hypertrophy. Did incidentally note mitral valve has moderate to severe calcification (hardening) - no regurgitation or noted stenosis. Also known 3 vessel CAD per CT chest. She was referred to Dorothy Boyd and pending CT coronary calcium and carotid US but patient ultimately declined.   She does have BP cuff, 120s/70s at home is BP: 112/68 She does workout. She denies chest pain, shortness of breath, dizziness.   She is on cholesterol medication (rosuvastatin 40 mg daily - consistently per patient since last visit, Dorothy Boyd considering PCSK9i pending CT coronary calcium) and denies myalgias. Her cholesterol is not at goal. The cholesterol last visit was:   Lab Results  Component Value Date   CHOL 202 (H) 07/14/2021   HDL 36 (L) 07/14/2021   LDLCALC 126 (H) 07/14/2021   TRIG 257 (H) 07/14/2021   CHOLHDL 5.6 (H) 07/14/2021   She has had diabetes for 10 + years.   She has been working on diet (limiting sweets) for T2 diabetes with CKD, no longer taking on metformin (abdominal pain and nausesa with 1 tab) - Taking glimepiride 4 mg BID daily Didn't do well with SGLT2i (was given numerous samples, reports had persistent yeast while taking) and  denies foot ulcerations, increased appetite, nausea, paresthesia of the feet, polydipsia, polyuria, visual disturbances and weight loss.  Patient does NOT check glucose - needle phobia and adamantly declines, insulin Admittedly poorly compliant with lifestyle and medications due to intolerance, typically GI sx Now on truicity 0.75 mg weekly and denies constipation Last A1C in the office was:  Lab Results  Component Value Date   HGBA1C 8.8 (H) 07/14/2021   She has CKD II associated with T2DM monitored at this office, no microalbuminuria, not on ACEi/ARB per patient preference. Last GFR:  Lab Results  Component Value Date   EGFR 61 07/14/2021   Patient is on Vitamin D supplement, 5000 IU gel, when she remembers  Lab Results  Component Value Date    VD25OH 67 07/14/2021     she has had unexplained leukocytosis and microcytosis documented since 2010.  Etiology remains unclear with possible myeloproliferative disorder and was referred back to Oncology for evaluation and was felt to be stable in 02/18/2021 by Dorothy Boyd, was recommended monitoring and follow up only if trending up.     Latest Ref Rng & Units 07/14/2021    4:27 PM 04/02/2021    4:56 PM 12/24/2020    3:56 PM  CBC  WBC 3.8 - 10.8 Thousand/uL 19.3   18.4   15.0    Hemoglobin 11.7 - 15.5 g/dL 13.1   13.1   12.2    Hematocrit 35.0 - 45.0 % 42.1   42.1   39.5    Platelets 140 - 400 Thousand/uL 485   501   448     States hasn't started on B12 supplement - tdffcc   cc    Lab Results  Component Value Date   VITAMINB12 331 07/14/2021   Lab Results  Component Value Date   IRON 85 07/14/2021   TIBC 455 (H) 07/14/2021   FERRITIN 74 07/14/2021       Medication Review: Current Outpatient Medications on File Prior to Visit  Medication Sig Dispense Refill   ALPRAZolam (XANAX) 0.5 MG tablet TAKE 1/2 TO 1 (ONE-HALF TO ONE) TABLET BY MOUTH ONCE DAILY AT BEDTIME AS NEEDED FOR ANXIETY FOR SLEEP 30 tablet 0   CHOLECALCIFEROL PO Take 5,000 Units by mouth daily.     citalopram (CELEXA) 20 MG tablet Take 1 tablet by mouth once daily 90 tablet 3   Cyanocobalamin (B-12) 500 MCG SUBL Place 1 tablet under the tongue daily.     diclofenac Sodium (VOLTAREN) 1 % GEL Apply 1 application topically 4 (four) times daily as needed.     Dulaglutide (TRULICITY) 4.40 HK/7.4QV SOPN Inject 0.75 mg into the skin once a week. 2 mL 0   glimepiride (AMARYL) 4 MG tablet TAKE 1 TABLET BY MOUTH TWICE DAILY WITH A MEAL 180 tablet 0   metFORMIN (GLUCOPHAGE-XR) 500 MG 24 hr tablet Take 500 mg by mouth daily with supper.     nystatin cream (MYCOSTATIN) Apply 1 application topically 2 (two) times daily. 30 g 1   promethazine (PHENERGAN) 25 MG tablet Take 1 tablet (25 mg total) by mouth every 6 (six) hours as needed  for nausea or vomiting. 30 tablet 3   QUEtiapine (SEROQUEL) 25 MG tablet TAKE 1 TABLET BY MOUTH AT BEDTIME AS NEEDED FOR SLEEP 90 tablet 3   rosuvastatin (CRESTOR) 40 MG tablet Take 1 tablet (40 mg total) by mouth daily. 90 tablet 1   triamcinolone ointment (KENALOG) 0.1 % Apply 1 application topically 2 (two) times daily. 80 g  1   aspirin EC 81 MG tablet Take 1 tablet (81 mg total) by mouth daily. Swallow whole. (Patient not taking: Reported on 10/15/2021)  0   No current facility-administered medications on file prior to visit.    Allergies  Allergen Reactions   Codeine    Glipizide Nausea And Vomiting   Lunesta [Eszopiclone]     Excessive fatigue   Metformin And Related     Nausea and abdominal pain   Welchol [Colesevelam Hcl]     nausea      Current Problems (verified) Patient Active Problem List   Diagnosis Date Noted   History of adenomatous polyp of colon 09/18/2021   B12 deficiency 07/15/2021   Diabetic peripheral neuropathy associated with type 2 diabetes mellitus ([place]) 07/14/2021   History of colon cancer 07/10/2021   Calcification of abdominal aorta ([place]) 07/02/2020   Adrenal adenoma, left 07/02/2020   Medication nonadherence due to intolerance 06/25/2020   LAFB (left anterior fascicular block) 06/24/2020   3-vessel CAD (per CT chest 11/2019) 06/24/2020   Constipation 06/24/2020   Mitral valve annular calcification 03/07/2020   Poor compliance 12/07/2019   Aortic atherosclerosis ([place]) 08/01/2019   CKD stage 2 due to type 2 diabetes mellitus ([place]) 07/20/2019   Tobacco abuse 04/16/2019   Depression, major, recurrent, in partial remission ([place]) 01/10/2019   Arthralgia of both feet 10/04/2018   Overweight (BMI 25.0-29.9) 09/29/2017   Hyperlipidemia associated with type 2 diabetes mellitus ([place])    Diabetes ([place])    Vitamin D deficiency    Anxiety    Leukocytosis 09/18/2012   SURGICAL HISTORY She  has a past surgical history that includes Appendectomy; Partial  colectomy (2010); Laparoscopic cholecystectomy (2010); Colonoscopy; and Breast biopsy (Left, 06/10/2016). FAMILY HISTORY Her family history includes Cancer - Ovarian in her mother; Diabetes in her paternal grandmother and sister; Gastric cancer in her paternal aunt; Heart disease in her brother; Hyperlipidemia in her mother and sister; Hypertension in her mother. SOCIAL HISTORY She  reports that she has been smoking cigarettes. She started smoking about 45 years ago. She has a 33.00 pack-year smoking history. She has never used smokeless tobacco. She reports that she does not drink alcohol and does not use drugs.     Review of Systems  Constitutional:  Negative for malaise/fatigue and weight loss.  HENT:  Negative for hearing loss and tinnitus.   Eyes:  Negative for blurred vision and double vision.  Respiratory:  Negative for cough, sputum production, shortness of breath and wheezing.   Cardiovascular:  Negative for chest pain, palpitations, orthopnea, claudication, leg swelling and PND.  Gastrointestinal:  Positive for vomiting (intemrittent episodic without clear trigger, 3 weeks). Negative for abdominal pain, blood in stool, constipation (improved), diarrhea, heartburn, melena and nausea.  Genitourinary: Negative.   Musculoskeletal:  Negative for falls (1 fall in 12 months ), joint pain (bil feet, improved) and myalgias.  Skin:  Negative for rash.  Neurological:  Negative for dizziness, tingling, sensory change, weakness and headaches.  Endo/Heme/Allergies:  Negative for polydipsia.  Psychiatric/Behavioral:  Negative for depression, memory loss, substance abuse and suicidal ideas. The patient is not nervous/anxious and does not have insomnia.   All other systems reviewed and are negative.   Objective:     Today's Vitals   10/15/21 1601  BP: 112/68  Pulse: 93  Temp: (!) 97.5 F (36.4 C)  SpO2: 97%  Weight: 162 lb (73.5 kg)   Body mass index is 29.63 kg/m.  General appearance:  alert,  no distress, WD/WN, female HEENT: normocephalic, sclerae anicteric, TMs pearly, nares patent, no discharge or erythema, pharynx normal Oral cavity: MMM, no lesions Neck: supple, no lymphadenopathy, no thyromegaly, no masses Heart: RRR, normal S1, S2, no murmurs  Lungs: CTA bilaterally, no wheezes, rhonchi, or rales Abdomen: +bs, firm, mildly distended, generalized tenderness with light palpation, no distinct palpable masses or organomegaly; tenderness limiting exam Musculoskeletal: nontender, no swelling, no obvious deformity Extremities: no edema, no cyanosis, no clubbing Pulses: 2+ symmetric, upper and lower extremities, normal cap refill Neurological: alert, oriented x 3, CN2-12 intact, strength normal upper extremities and lower extremities, sensation extremely diminished to monofilament bil feet and ankles, DTRs 2+ throughout, no cerebellar signs, gait slow steady Psychiatric: normal affect, behavior normal, pleasant  Skin: warm, dry, no rash or ecchymosis   Izora Ribas, NP   10/15/2021

## 2021-10-16 ENCOUNTER — Encounter: Payer: Self-pay | Admitting: Adult Health

## 2021-10-16 LAB — CBC WITH DIFFERENTIAL/PLATELET
Absolute Monocytes: 752 cells/uL (ref 200–950)
Basophils Absolute: 137 cells/uL (ref 0–200)
Basophils Relative: 0.8 %
Eosinophils Absolute: 359 cells/uL (ref 15–500)
Eosinophils Relative: 2.1 %
HCT: 38.3 % (ref 35.0–45.0)
Hemoglobin: 12 g/dL (ref 11.7–15.5)
Lymphs Abs: 5489 cells/uL — ABNORMAL HIGH (ref 850–3900)
MCH: 19 pg — ABNORMAL LOW (ref 27.0–33.0)
MCHC: 31.3 g/dL — ABNORMAL LOW (ref 32.0–36.0)
MCV: 60.6 fL — ABNORMAL LOW (ref 80.0–100.0)
MPV: 10.1 fL (ref 7.5–12.5)
Monocytes Relative: 4.4 %
Neutro Abs: 10363 cells/uL — ABNORMAL HIGH (ref 1500–7800)
Neutrophils Relative %: 60.6 %
Platelets: 444 10*3/uL — ABNORMAL HIGH (ref 140–400)
RBC: 6.32 10*6/uL — ABNORMAL HIGH (ref 3.80–5.10)
RDW: 17.1 % — ABNORMAL HIGH (ref 11.0–15.0)
Total Lymphocyte: 32.1 %
WBC: 17.1 10*3/uL — ABNORMAL HIGH (ref 3.8–10.8)

## 2021-10-16 LAB — COMPLETE METABOLIC PANEL WITH GFR
AG Ratio: 1.6 (calc) (ref 1.0–2.5)
ALT: 12 U/L (ref 6–29)
AST: 11 U/L (ref 10–35)
Albumin: 4.4 g/dL (ref 3.6–5.1)
Alkaline phosphatase (APISO): 87 U/L (ref 37–153)
BUN: 15 mg/dL (ref 7–25)
CO2: 25 mmol/L (ref 20–32)
Calcium: 10.2 mg/dL (ref 8.6–10.4)
Chloride: 100 mmol/L (ref 98–110)
Creat: 0.85 mg/dL (ref 0.50–1.05)
Globulin: 2.7 g/dL (calc) (ref 1.9–3.7)
Glucose, Bld: 208 mg/dL — ABNORMAL HIGH (ref 65–99)
Potassium: 4.2 mmol/L (ref 3.5–5.3)
Sodium: 137 mmol/L (ref 135–146)
Total Bilirubin: 0.5 mg/dL (ref 0.2–1.2)
Total Protein: 7.1 g/dL (ref 6.1–8.1)
eGFR: 75 mL/min/{1.73_m2} (ref >=60)

## 2021-10-16 LAB — LIPID PANEL
Cholesterol: 108 mg/dL (ref <200)
HDL: 30 mg/dL — ABNORMAL LOW (ref >=50)
LDL Cholesterol (Calc): 55 mg/dL (calc)
Non-HDL Cholesterol (Calc): 78 mg/dL (calc) (ref <130)
Total CHOL/HDL Ratio: 3.6 (calc) (ref <5.0)
Triglycerides: 151 mg/dL — ABNORMAL HIGH (ref <150)

## 2021-10-16 LAB — HEMOGLOBIN A1C
Hgb A1c MFr Bld: 8.4 % of total Hgb — ABNORMAL HIGH (ref <5.7)
Mean Plasma Glucose: 194 mg/dL
eAG (mmol/L): 10.8 mmol/L

## 2021-10-16 LAB — CBC MORPHOLOGY

## 2021-10-16 LAB — TSH: TSH: 2.57 mIU/L (ref 0.40–4.50)

## 2021-10-16 LAB — MAGNESIUM: Magnesium: 2.2 mg/dL (ref 1.5–2.5)

## 2021-10-17 ENCOUNTER — Other Ambulatory Visit: Payer: Self-pay | Admitting: Adult Health

## 2021-10-17 DIAGNOSIS — K5900 Constipation, unspecified: Secondary | ICD-10-CM

## 2021-10-17 MED ORDER — LACTULOSE 10 GM/15ML PO SOLN: ORAL | 3 refills | Status: DC

## 2021-10-17 MED ORDER — ONDANSETRON HCL 4 MG PO TABS: 4.0000 mg | ORAL_TABLET | Freq: Three times a day (TID) | ORAL | 1 refills | Status: AC | PRN

## 2021-10-29 ENCOUNTER — Other Ambulatory Visit: Payer: Self-pay | Admitting: Adult Health

## 2021-10-29 DIAGNOSIS — F3341 Major depressive disorder, recurrent, in partial remission: Secondary | ICD-10-CM

## 2021-10-29 DIAGNOSIS — G4700 Insomnia, unspecified: Secondary | ICD-10-CM

## 2021-11-19 ENCOUNTER — Encounter: Payer: Self-pay | Admitting: Adult Health

## 2021-11-22 NOTE — Progress Notes (Deleted)
Assessment and Plan:  There are no diagnoses linked to this encounter.    Further disposition pending results of labs. Discussed med's effects and SE's.   Over 30 minutes of exam, counseling, chart review, and critical decision making was performed.   Future Appointments  Date Time Provider [place]  11/23/2021  3:30 PM Alycia Rossetti, NP [facility] None  01/20/2022  2:30 PM Darrol Jump, NP [facility] None  04/06/2022  4:00 PM Darrol Jump, NP [facility] None  07/14/2022  3:00 PM Cranford, Kenney Houseman, NP [facility] None    ------------------------------------------------------------------------------------------------------------------   HPI There were no vitals taken for this visit. 66 y.o.female presents for  Past Medical History:  Diagnosis Date   Anxiety    Cervical dysplasia    Colon cancer ([place])    Depression    Diabetes ([place])    History of colon cancer, stage II 09/18/2012   Hyperlipidemia    Right-sided Bell's palsy 01/03/2019   Vitamin D deficiency      Allergies  Allergen Reactions   Codeine    Glipizide Nausea And Vomiting   Lunesta [Eszopiclone]     Excessive fatigue   Metformin And Related     Nausea and abdominal pain   Welchol [Colesevelam Hcl]     nausea     Current Outpatient Medications on File Prior to Visit  Medication Sig   ALPRAZolam (XANAX) 0.5 MG tablet TAKE 1/2 TO 1 (ONE-HALF TO ONE) TABLET BY MOUTH AT BEDTIME AS NEEDED FOR SLEEP OR  ANXIETY   aspirin EC 81 MG tablet Take 1 tablet (81 mg total) by mouth daily. Swallow whole. (Patient not taking: Reported on 10/15/2021)   CHOLECALCIFEROL PO Take 5,000 Units by mouth daily.   citalopram (CELEXA) 20 MG tablet Take 1 tablet by mouth once daily   Cyanocobalamin (B-12) 500 MCG SUBL Place 1 tablet under the tongue daily.   diclofenac Sodium (VOLTAREN) 1 % GEL Apply 1 application topically 4 (four) times daily as needed.   Dulaglutide (TRULICITY) 3.29 JM/4.2AS SOPN Inject 0.75 mg into  the skin once a week.   glimepiride (AMARYL) 4 MG tablet TAKE 1 TABLET BY MOUTH TWICE DAILY WITH A MEAL   lactulose (CHRONULAC) 10 GM/15ML solution Take 1-2 tablespoons (15-30 ml) daily as needed for constipation.   metFORMIN (GLUCOPHAGE-XR) 500 MG 24 hr tablet Take 500 mg by mouth daily with supper.   nystatin cream (MYCOSTATIN) Apply 1 application topically 2 (two) times daily.   ondansetron (ZOFRAN) 4 MG tablet Take 1 tablet (4 mg total) by mouth every 8 (eight) hours as needed for nausea or vomiting.   promethazine (PHENERGAN) 25 MG tablet Take 1 tablet (25 mg total) by mouth every 6 (six) hours as needed for nausea or vomiting.   QUEtiapine (SEROQUEL) 25 MG tablet TAKE 1 TABLET BY MOUTH AT BEDTIME AS NEEDED FOR SLEEP   rosuvastatin (CRESTOR) 40 MG tablet Take 1 tablet (40 mg total) by mouth daily.   triamcinolone ointment (KENALOG) 0.1 % Apply 1 application topically 2 (two) times daily.   No current facility-administered medications on file prior to visit.    ROS: all negative except above.   Physical Exam:  There were no vitals taken for this visit.  General Appearance: Well nourished, in no apparent distress. Eyes: PERRLA, EOMs, conjunctiva no swelling or erythema Sinuses: No Frontal/maxillary tenderness ENT/Mouth: Ext aud canals clear, TMs without erythema, bulging. No erythema, swelling, or exudate on post pharynx.  Tonsils not swollen or erythematous. Hearing normal.  Neck:  Supple, thyroid normal.  Respiratory: Respiratory effort normal, BS equal bilaterally without rales, rhonchi, wheezing or stridor.  Cardio: RRR with no MRGs. Brisk peripheral pulses without edema.  Abdomen: Soft, + BS.  Non tender, no guarding, rebound, hernias, masses. Lymphatics: Non tender without lymphadenopathy.  Musculoskeletal: Full ROM, 5/5 strength, normal gait.  Skin: Warm, dry without rashes, lesions, ecchymosis.  Neuro: Cranial nerves intact. Normal muscle tone, no cerebellar symptoms.  Sensation intact.  Psych: Awake and oriented X 3, normal affect, Insight and Judgment appropriate.     Alycia Rossetti, NP 11:10 AM [facility]

## 2021-11-23 ENCOUNTER — Encounter: Payer: Medicare Other | Admitting: Nurse Practitioner

## 2022-01-09 ENCOUNTER — Other Ambulatory Visit: Payer: Self-pay | Admitting: Internal Medicine

## 2022-01-09 DIAGNOSIS — E1169 Type 2 diabetes mellitus with other specified complication: Secondary | ICD-10-CM

## 2022-01-09 MED ORDER — ROSUVASTATIN CALCIUM 40 MG PO TABS: ORAL_TABLET | ORAL | 3 refills | Status: DC

## 2022-01-20 ENCOUNTER — Ambulatory Visit (INDEPENDENT_AMBULATORY_CARE_PROVIDER_SITE_OTHER): Payer: Medicare Other | Admitting: Nurse Practitioner

## 2022-01-20 ENCOUNTER — Encounter: Payer: Self-pay | Admitting: Nurse Practitioner

## 2022-01-20 VITALS — BP 122/70 | HR 86 | Temp 97.7°F | Ht 62.0 in | Wt 161.0 lb

## 2022-01-20 DIAGNOSIS — I3481 Nonrheumatic mitral (valve) annulus calcification: Secondary | ICD-10-CM

## 2022-01-20 DIAGNOSIS — E559 Vitamin D deficiency, unspecified: Secondary | ICD-10-CM

## 2022-01-20 DIAGNOSIS — Z794 Long term (current) use of insulin: Secondary | ICD-10-CM

## 2022-01-20 DIAGNOSIS — D72829 Elevated white blood cell count, unspecified: Secondary | ICD-10-CM

## 2022-01-20 DIAGNOSIS — F3341 Major depressive disorder, recurrent, in partial remission: Secondary | ICD-10-CM

## 2022-01-20 DIAGNOSIS — I700 Atherosclerosis of aorta: Secondary | ICD-10-CM

## 2022-01-20 DIAGNOSIS — N182 Chronic kidney disease, stage 2 (mild): Secondary | ICD-10-CM

## 2022-01-20 DIAGNOSIS — I2510 Atherosclerotic heart disease of native coronary artery without angina pectoris: Secondary | ICD-10-CM | POA: Diagnosis not present

## 2022-01-20 DIAGNOSIS — Z85038 Personal history of other malignant neoplasm of large intestine: Secondary | ICD-10-CM | POA: Diagnosis not present

## 2022-01-20 DIAGNOSIS — E1169 Type 2 diabetes mellitus with other specified complication: Secondary | ICD-10-CM

## 2022-01-20 DIAGNOSIS — E1122 Type 2 diabetes mellitus with diabetic chronic kidney disease: Secondary | ICD-10-CM

## 2022-01-20 DIAGNOSIS — E663 Overweight: Secondary | ICD-10-CM

## 2022-01-20 DIAGNOSIS — E1142 Type 2 diabetes mellitus with diabetic polyneuropathy: Secondary | ICD-10-CM

## 2022-01-20 DIAGNOSIS — E785 Hyperlipidemia, unspecified: Secondary | ICD-10-CM

## 2022-01-20 DIAGNOSIS — Z79899 Other long term (current) drug therapy: Secondary | ICD-10-CM

## 2022-01-20 DIAGNOSIS — Z720 Tobacco use: Secondary | ICD-10-CM

## 2022-01-20 NOTE — Progress Notes (Signed)
3 MONTH FOLLOW UP  Assessment:    Aortic atherosclerosis ([place])  - Per CT 2019 Discussed lifestyle modifications. Recommended diet heavy in fruits and veggies, omega 3's. Decrease consumption of animal meats, cheeses, and dairy products. Remain active and exercise as tolerated. Continue to monitor. Check/monitor lipids/TSH   3 Vessel CAD STOP SMOKING Denies angina Control blood pressure, cholesterol, glucose, increase exercise.  Recommend bASA, and statin  LDL goal <70 Has seen Dr. Gwenlyn Found, pending CT coronary calcium, she has declined to pursue at this time  Mitral valve annular calcificiation Denies concerning sx; stop smoking, control cholesterol and sugars Est with cardiology, monitoring only   History of colon cancer  Hx of, s/p resection, has seen [name] for colonoscopy in 2022 She plans to reach out for yearly updated. Continue to monitor    Hyperlipidemia associated with type 2 diabetes mellitus ([place]) Discussed lifestyle modifications. Recommended diet heavy in fruits and veggies, omega 3's. Decrease consumption of animal meats, cheeses, and dairy products. Remain active and exercise as tolerated. Continue to monitor. Check/monitor lipids/TSH   Type 2 diabetes mellitus with stage 1 chronic kidney disease, with long-term current use of insulin ([name]) Type 2 Diabetes Mellitus - poorly controlled Complicated by non-compliance, med intolerance Intolerance: metformin, SGLT2i (numerous) ABSOLUTELY DECLINES INSULIN OR CHECKING GLUCOSE - fear of needles Maximized on glimepiride  Lifestyle emphasized  Education: Reviewed 'ABCs' of diabetes management (respective goals in parentheses):  A1C (<7), blood pressure (<130/80), and cholesterol (LDL <70) Eye Exam yearly and Dental Exam every 6 months. Dietary recommendations Check/monitor A1c  CKD stage 2 due to type 2 diabetes mellitus ([name]) Continue ACE, bASA, statin Discussed how what you eat and drink can aide in  kidney protection. Stay well hydrated. Avoid high salt foods. Avoid NSAIDS. Keep BP and BG well controlled.   Take medications as prescribed. Remain active and exercise as tolerated daily. Maintain weight.  Continue to monitor. Check/monitor CMP/GFR/Microablumin   Diabetic peripheral neuropathy ([name]) Declines medications at this time, emphasized improved glucose control, quit smoking, increase exercise, needs daily feet checks.   Depression, major, recurrent, in partial remission ([name]) Celexa 20 mg daily, seroquel, on board.  Managed Follow up sooner if SE or other concerns, continue seroquel Lifestyle discussed: diet/exerise, sleep hygiene, stress management, hydration  Vitamin D deficiency Encouraged taking supplement consistently, check levels   Smoker Discussed risks associated with tobacco use and advised to reduce or quit Patient is not ready to do so, but advised to consider strongly Will follow up at the next visit - had CTA benign 07/2021, resume low dose CT screening 2024 Smoking cessation instruction/counseling given:  counseled patient on the dangers of tobacco use, advised patient to stop smoking, and reviewed strategies to maximize success   Overweight (BMI 25.0-29.9) Discussed appropriate BMI Goal of losing 1 lb per month. Diet modification. Physical activity. Encouraged/praised to build confidence.   Leukocytosis, unspecified type Has seen [name]; monitoring q25mhere Discussed could be secondary to nicotine dependence Monitor/check CBC  Medication management All medications discussed and reviewed in full. All questions and concerns regarding medications addressed.    Orders Placed This Encounter  Procedures   CBC with Differential/Platelet   COMPLETE METABOLIC PANEL WITH GFR   Lipid panel   Hemoglobin A1c   VITAMIN D 25 Hydroxy (Vit-D Deficiency, Fractures)   Over 30 minutes of exam, counseling, chart review and critical decision making was  performed Future Appointments  Date Time Provider [place] 04/07/2022  3:30 PM CDarrol Jump  NP [facility] None  07/14/2022  3:00 PM Reeve Mallo, Kenney Houseman, NP [facility] None    Subjective:  Dorothy Boyd is a 66 y.o. female who presents for 3 month follow up on X2JJ with complications, hld, smoking, mood, weight.   Overall she reports doing well.    She has hx of colon cancer in 2010, s/p resection and established with [name];  she went to [facility] for colonoscopy 09/01/2020 but not full prep, did remove 8 polyps, was recommended 1 year follow up.  She plans to schedule a follow up.  She has a diagnosis of depression and is currently on celexa 20 mg daily daily for mood and doing well. Also on seroquel and finds this helpful for sleep, failed trazodone and gabeptnin. She has been on xanax, takes 0.25 mg as needed for sleep a few days a week.   She continues to smoke, has cut back to <0.5 pack a day, but has 30+ year smoking history, since 1978. Denies cough, dyspnea. She does not want to quit.  She had CT lung cancer screening 12/2019 without concerning nodule but suggestive of small airway disease. She is not interested in cutting back at this time.   She had low dose CT lung pending but had ED visit [facility]/05/[facility] at [facility] where they did CTA to r/o PE, showed no concerning masses or nodules.   BMI is Body mass index is 29.45 kg/m., she has not been working on diet and exercise.  She has been working on [employer] Readings from Last 3 Encounters:  01/20/22 161 lb (73 kg)  10/15/21 162 lb (73.5 kg)  07/14/21 163 lb 12.8 oz (74.3 kg)   Has aortic atherosclerosis and 3 vessel CAD per CT 11/2017, 11/2019.  CT chest showed dilated pulm arteries; underwent ECHO 03/06/2020 which showed normal pulmonary artery pressure, no atypical dilation, no hypertrophy. Did incidentally note mitral valve has moderate to severe calcification (hardening) - no regurgitation or noted stenosis. Also known  3 vessel CAD per CT chest. She was referred to Dr. Gwenlyn Found and pending CT coronary calcium and carotid US but patient ultimately declined.   She does have BP cuff, 120s/70s at home is BP: 122/70 She does workout. She denies chest pain, shortness of breath, dizziness.   She is on cholesterol medication  intermittently (rosuvastatin 40 mg daily) and denies myalgias. Her cholesterol is not at goal. The cholesterol last visit was:   Lab Results  Component Value Date   CHOL 108 10/15/2021   HDL 30 (L) 10/15/2021   LDLCALC 55 10/15/2021   TRIG 151 (H) 10/15/2021   CHOLHDL 3.6 10/15/2021   She has had diabetes for 10 + years.   She has been working on diet (limiting sweets) for T2 diabetes with CKD, no longer taking on metformin (abdominal pain and nausesa with 1 tab) - Taking glimepiride 4 mg BID daily Didn't do well with SGLT2i (was given numerous samples, reports had persistent yeast while taking) and denies foot ulcerations, increased appetite, nausea, paresthesia of the feet, polydipsia, polyuria, visual disturbances and weight loss.  Patient does NOT check glucose - needle phobia and adamantly declines, insulin Admittedly poorly compliant with lifestyle and medications due to intolerance, typically GI sx Now on truicity 0.75 mg weekly and denies constipation Last A1C in the office was:  Lab Results  Component Value Date   HGBA1C 8.4 (H) 10/15/2021   She has CKD II associated with T2DM monitored at this office, no microalbuminuria, not on ACEi/ARB  per patient preference. Last GFR:  Lab Results  Component Value Date   EGFR 75 10/15/2021   Patient is on Vitamin D supplement, 5000 IU gel, when she remembers  Lab Results  Component Value Date   VD25OH 15 07/14/2021     she has had unexplained leukocytosis and microcytosis documented since 2010.  Etiology remains unclear with possible myeloproliferative disorder and was referred back to Oncology for evaluation and was felt to be stable in  02/18/2021 by [name], was recommended monitoring and follow up only if trending up.     Latest Ref Rng & Units 10/15/2021    4:58 PM 07/14/2021    4:27 PM 04/02/2021    4:56 PM  CBC  WBC 3.8 - 10.8 Thousand/uL 17.1  19.3  18.4   Hemoglobin 11.7 - 15.5 g/dL 12.0  13.1  13.1   Hematocrit 35.0 - 45.0 % 38.3  42.1  42.1   Platelets 140 - 400 Thousand/uL 444  485  501    States hasn't started on B12 supplement - tdffcc   cc    Lab Results  Component Value Date   VITAMINB12 331 07/14/2021   Lab Results  Component Value Date   IRON 85 07/14/2021   TIBC 455 (H) 07/14/2021   FERRITIN 74 07/14/2021   Medication Review: Current Outpatient Medications on File Prior to Visit  Medication Sig Dispense Refill   ALPRAZolam (XANAX) 0.5 MG tablet TAKE 1/2 TO 1 (ONE-HALF TO ONE) TABLET BY MOUTH AT BEDTIME AS NEEDED FOR SLEEP OR  ANXIETY 30 tablet 0   CHOLECALCIFEROL PO Take 5,000 Units by mouth daily.     citalopram (CELEXA) 20 MG tablet Take 1 tablet by mouth once daily 90 tablet 0   Cyanocobalamin (B-12) 500 MCG SUBL Place 1 tablet under the tongue daily.     diclofenac Sodium (VOLTAREN) 1 % GEL Apply 1 application topically 4 (four) times daily as needed.     Dulaglutide (TRULICITY) 1.31 YH/8.8IL SOPN Inject 0.75 mg into the skin once a week. 2 mL 0   glimepiride (AMARYL) 4 MG tablet TAKE 1 TABLET BY MOUTH TWICE DAILY WITH A MEAL 180 tablet 0   lactulose (CHRONULAC) 10 GM/15ML solution Take 1-2 tablespoons (15-30 ml) daily as needed for constipation. 240 mL 3   metFORMIN (GLUCOPHAGE-XR) 500 MG 24 hr tablet Take 500 mg by mouth daily with supper.     nystatin cream (MYCOSTATIN) Apply 1 application topically 2 (two) times daily. 30 g 1   ondansetron (ZOFRAN) 4 MG tablet Take 1 tablet (4 mg total) by mouth every 8 (eight) hours as needed for nausea or vomiting. 60 tablet 1   promethazine (PHENERGAN) 25 MG tablet Take 1 tablet (25 mg total) by mouth every 6 (six) hours as needed for nausea or  vomiting. 30 tablet 3   QUEtiapine (SEROQUEL) 25 MG tablet TAKE 1 TABLET BY MOUTH AT BEDTIME AS NEEDED FOR SLEEP 90 tablet 3   triamcinolone ointment (KENALOG) 0.1 % Apply 1 application topically 2 (two) times daily. 80 g 1   aspirin EC 81 MG tablet Take 1 tablet (81 mg total) by mouth daily. Swallow whole. (Patient not taking: Reported on 10/15/2021)  0   rosuvastatin (CRESTOR) 40 MG tablet Take  1 tablet  Daily  for Cholesterol (Patient not taking: Reported on 01/20/2022) 90 tablet 3   No current facility-administered medications on file prior to visit.    Allergies  Allergen Reactions   Codeine    Glipizide  Nausea And Vomiting   Lunesta [Eszopiclone]     Excessive fatigue   Metformin And Related     Nausea and abdominal pain   Welchol [Colesevelam Hcl]     nausea      Current Problems (verified) Patient Active Problem List   Diagnosis Date Noted   History of adenomatous polyp of colon 09/18/2021   B12 deficiency 07/15/2021   Diabetic peripheral neuropathy associated with type 2 diabetes mellitus ([place]) 07/14/2021   History of colon cancer 07/10/2021   Calcification of abdominal aorta ([place]) 07/02/2020   Adrenal adenoma, left 07/02/2020   Medication nonadherence due to intolerance 06/25/2020   LAFB (left anterior fascicular block) 06/24/2020   3-vessel CAD (per CT chest 11/2019) 06/24/2020   Constipation 06/24/2020   Mitral valve annular calcification 03/07/2020   Poor compliance 12/07/2019   Aortic atherosclerosis ([place]) 08/01/2019   CKD stage 2 due to type 2 diabetes mellitus ([place]) 07/20/2019   Tobacco abuse 04/16/2019   Depression, major, recurrent, in partial remission ([place]) 01/10/2019   Arthralgia of both feet 10/04/2018   Overweight (BMI 25.0-29.9) 09/29/2017   Hyperlipidemia associated with type 2 diabetes mellitus ([place])    Diabetes ([place])    Vitamin D deficiency    Anxiety    Leukocytosis 09/18/2012   SURGICAL HISTORY She  has a past surgical history that includes  Appendectomy; Partial colectomy (2010); Laparoscopic cholecystectomy (2010); Colonoscopy; and Breast biopsy (Left, 06/10/2016). FAMILY HISTORY Her family history includes Cancer - Ovarian in her mother; Diabetes in her paternal grandmother and sister; Gastric cancer in her paternal aunt; Heart disease in her brother; Hyperlipidemia in her mother and sister; Hypertension in her mother. SOCIAL HISTORY She  reports that she has been smoking cigarettes. She started smoking about 45 years ago. She has a 33.00 pack-year smoking history. She has never used smokeless tobacco. She reports that she does not drink alcohol and does not use drugs.     Review of Systems  Constitutional:  Negative for malaise/fatigue and weight loss.  HENT:  Negative for hearing loss and tinnitus.   Eyes:  Negative for blurred vision and double vision.  Respiratory:  Negative for cough, sputum production, shortness of breath and wheezing.   Cardiovascular:  Negative for chest pain, palpitations, orthopnea, claudication, leg swelling and PND.  Gastrointestinal:  Negative for abdominal pain, blood in stool, constipation (improved), diarrhea, heartburn, melena, nausea and vomiting.  Genitourinary: Negative.   Musculoskeletal:  Negative for falls, joint pain (bil feet, improved) and myalgias.  Skin:  Negative for rash.  Neurological:  Negative for dizziness, tingling, sensory change, weakness and headaches.  Endo/Heme/Allergies:  Negative for polydipsia.  Psychiatric/Behavioral:  Negative for depression, memory loss, substance abuse and suicidal ideas. The patient is not nervous/anxious and does not have insomnia.   All other systems reviewed and are negative.    Objective:     Today's Vitals   01/20/22 1438  BP: 122/70  Pulse: 86  Temp: 97.7 F (36.5 C)  SpO2: 94%  Weight: 161 lb (73 kg)  Height: '5\' 2"'  (1.575 m)   Body mass index is 29.45 kg/m.  General appearance: alert, no distress, WD/WN, female HEENT:  normocephalic, sclerae anicteric, TMs pearly, nares patent, no discharge or erythema, pharynx normal Oral cavity: MMM, no lesions Neck: supple, no lymphadenopathy, no thyromegaly, no masses Heart: RRR, normal S1, S2, no murmurs  Lungs: CTA bilaterally, no wheezes, rhonchi, or rales Abdomen: +bs, firm, mildly distended, generalized tenderness with light palpation, no distinct palpable masses  or organomegaly; tenderness limiting exam Musculoskeletal: nontender, no swelling, no obvious deformity Extremities: no edema, no cyanosis, no clubbing Pulses: 2+ symmetric, upper and lower extremities, normal cap refill Neurological: alert, oriented x 3, CN2-12 intact, strength normal upper extremities and lower extremities, sensation extremely diminished to monofilament bil feet and ankles, DTRs 2+ throughout, no cerebellar signs, gait slow steady Psychiatric: normal affect, behavior normal, pleasant  Skin: warm, dry, no rash or ecchymosis   Maryclare Nydam, NP   01/20/2022

## 2022-01-20 NOTE — Patient Instructions (Signed)
Skin Abscess  A skin abscess is an infected area on or under your skin that contains a collection of pus and other material. An abscess may also be called a furuncle, carbuncle, or boil. An abscess can occur in or on almost any part of your body. Some abscesses break open (rupture) on their own. Most continue to get worse unless they are treated. The infection can spread deeper into the body and eventually into your blood, which can make you feel ill. Treatment usually involves draining the abscess. What are the causes? An abscess occurs when germs, like bacteria, pass through your skin and cause an infection. This may be caused by: A scrape or cut on your skin. A puncture wound through your skin, including a needle injection or insect bite. Blocked oil or sweat glands. Blocked and infected hair follicles. A cyst that forms beneath your skin (sebaceous cyst) and becomes infected. What increases the risk? This condition is more likely to develop in people who: Have a weak body defense system (immune system). Have diabetes. Have dry and irritated skin. Get frequent injections or use illegal IV drugs. Have a foreign body in a wound, such as a splinter. Have problems with their lymph system or veins. What are the signs or symptoms? Symptoms of this condition include: A painful, firm bump under the skin. A bump with pus at the top. This may break through the skin and drain. Other symptoms include: Redness surrounding the abscess site. Warmth. Swelling of the lymph nodes (glands) near the abscess. Tenderness. A sore on the skin. How is this diagnosed? This condition may be diagnosed based on: A physical exam. Your medical history. A sample of pus. This may be used to find out what is causing the infection. Blood tests. Imaging tests, such as an ultrasound, CT scan, or MRI. How is this treated? A small abscess that drains on its own may not need treatment. Treatment for larger abscesses  may include: Moist heat or heat pack applied to the area several times a day. A procedure to drain the abscess (incision and drainage). Antibiotic medicines. For a severe abscess, you may first get antibiotics through an IV and then change to antibiotics by mouth. Follow these instructions at home: Medicines  Take over-the-counter and prescription medicines only as told by your health care provider. If you were prescribed an antibiotic medicine, take it as told by your health care provider. Do not stop taking the antibiotic even if you start to feel better. Abscess care  If you have an abscess that has not drained, apply heat to the affected area. Use the heat source that your health care provider recommends, such as a moist heat pack or a heating pad. Place a towel between your skin and the heat source. Leave the heat on for 20-30 minutes. Remove the heat if your skin turns bright red. This is especially important if you are unable to feel pain, heat, or cold. You may have a greater risk of getting burned. Follow instructions from your health care provider about how to take care of your abscess. Make sure you: Cover the abscess with a bandage (dressing). Change your dressing or gauze as told by your health care provider. Wash your hands with soap and water before you change the dressing or gauze. If soap and water are not available, use hand sanitizer. Check your abscess every day for signs of a worsening infection. Check for: More redness, swelling, or pain. More fluid or blood. Warmth.   More pus or a bad smell. General instructions To avoid spreading the infection: Do not share personal care items, towels, or hot tubs with others. Avoid making skin contact with other people. Keep all follow-up visits as told by your health care provider. This is important. Contact a health care provider if you have: More redness, swelling, or pain around your abscess. More fluid or blood coming from  your abscess. Warm skin around your abscess. More pus or a bad smell coming from your abscess. Muscle aches. Chills or a general ill feeling. Get help right away if you: Have severe pain. See red streaks on your skin spreading away from the abscess. See redness that spreads quickly. Have a fever or chills. Summary A skin abscess is an infected area on or under your skin that contains a collection of pus and other material. A small abscess that drains on its own may not need treatment. Treatment for larger abscesses may include having a procedure to drain the abscess and taking an antibiotic. This information is not intended to replace advice given to you by your health care provider. Make sure you discuss any questions you have with your health care provider. Document Revised: 08/20/2021 Document Reviewed: 02/23/2021 Elsevier Patient Education  2023 Elsevier Inc.  

## 2022-01-21 ENCOUNTER — Other Ambulatory Visit: Payer: Self-pay | Admitting: Nurse Practitioner

## 2022-01-21 DIAGNOSIS — F3341 Major depressive disorder, recurrent, in partial remission: Secondary | ICD-10-CM

## 2022-01-21 LAB — CBC WITH DIFFERENTIAL/PLATELET
Absolute Monocytes: 714 cells/uL (ref 200–950)
Basophils Absolute: 149 cells/uL (ref 0–200)
Basophils Relative: 0.9 %
Eosinophils Absolute: 282 cells/uL (ref 15–500)
Eosinophils Relative: 1.7 %
HCT: 41.6 % (ref 35.0–45.0)
Hemoglobin: 13.2 g/dL (ref 11.7–15.5)
Lymphs Abs: 5528 cells/uL — ABNORMAL HIGH (ref 850–3900)
MCH: 19.2 pg — ABNORMAL LOW (ref 27.0–33.0)
MCHC: 31.7 g/dL — ABNORMAL LOW (ref 32.0–36.0)
MCV: 60.6 fL — ABNORMAL LOW (ref 80.0–100.0)
MPV: 9.7 fL (ref 7.5–12.5)
Monocytes Relative: 4.3 %
Neutro Abs: 9927 cells/uL — ABNORMAL HIGH (ref 1500–7800)
Neutrophils Relative %: 59.8 %
Platelets: 419 10*3/uL — ABNORMAL HIGH (ref 140–400)
RBC: 6.86 10*6/uL — ABNORMAL HIGH (ref 3.80–5.10)
RDW: 18.3 % — ABNORMAL HIGH (ref 11.0–15.0)
Total Lymphocyte: 33.3 %
WBC: 16.6 10*3/uL — ABNORMAL HIGH (ref 3.8–10.8)

## 2022-01-21 LAB — COMPLETE METABOLIC PANEL WITH GFR
AG Ratio: 1.6 (calc) (ref 1.0–2.5)
ALT: 11 U/L (ref 6–29)
AST: 13 U/L (ref 10–35)
Albumin: 4.5 g/dL (ref 3.6–5.1)
Alkaline phosphatase (APISO): 87 U/L (ref 37–153)
BUN: 13 mg/dL (ref 7–25)
CO2: 27 mmol/L (ref 20–32)
Calcium: 10.2 mg/dL (ref 8.6–10.4)
Chloride: 102 mmol/L (ref 98–110)
Creat: 0.81 mg/dL (ref 0.50–1.05)
Globulin: 2.8 g/dL (calc) (ref 1.9–3.7)
Glucose, Bld: 111 mg/dL — ABNORMAL HIGH (ref 65–99)
Potassium: 5 mmol/L (ref 3.5–5.3)
Sodium: 138 mmol/L (ref 135–146)
Total Bilirubin: 0.6 mg/dL (ref 0.2–1.2)
Total Protein: 7.3 g/dL (ref 6.1–8.1)
eGFR: 80 mL/min/{1.73_m2} (ref >=60)

## 2022-01-21 LAB — LIPID PANEL
Cholesterol: 125 mg/dL (ref <200)
HDL: 33 mg/dL — ABNORMAL LOW (ref >=50)
LDL Cholesterol (Calc): 65 mg/dL (calc)
Non-HDL Cholesterol (Calc): 92 mg/dL (calc) (ref <130)
Total CHOL/HDL Ratio: 3.8 (calc) (ref <5.0)
Triglycerides: 198 mg/dL — ABNORMAL HIGH (ref <150)

## 2022-01-21 LAB — VITAMIN D 25 HYDROXY (VIT D DEFICIENCY, FRACTURES): Vit D, 25-Hydroxy: 39 ng/mL (ref 30–100)

## 2022-01-21 LAB — HEMOGLOBIN A1C
Hgb A1c MFr Bld: 7.7 % of total Hgb — ABNORMAL HIGH (ref <5.7)
Mean Plasma Glucose: 174 mg/dL
eAG (mmol/L): 9.7 mmol/L

## 2022-01-28 ENCOUNTER — Other Ambulatory Visit: Payer: Self-pay | Admitting: Nurse Practitioner

## 2022-03-11 ENCOUNTER — Other Ambulatory Visit: Payer: Self-pay | Admitting: Nurse Practitioner

## 2022-03-11 DIAGNOSIS — G4700 Insomnia, unspecified: Secondary | ICD-10-CM

## 2022-03-22 ENCOUNTER — Other Ambulatory Visit: Payer: Self-pay

## 2022-03-22 MED ORDER — TRULICITY 0.75 MG/0.5ML ~~LOC~~ SOAJ: 0.7500 mg | SUBCUTANEOUS | 0 refills | Status: DC

## 2022-04-06 ENCOUNTER — Ambulatory Visit: Payer: Medicare Other | Admitting: Nurse Practitioner

## 2022-04-07 ENCOUNTER — Ambulatory Visit: Payer: Medicare Other | Admitting: Nurse Practitioner

## 2022-04-21 ENCOUNTER — Encounter: Payer: Self-pay | Admitting: Nurse Practitioner

## 2022-04-21 ENCOUNTER — Other Ambulatory Visit: Payer: Self-pay | Admitting: Nurse Practitioner

## 2022-04-21 MED ORDER — GLIMEPIRIDE 4 MG PO TABS: 4.0000 mg | ORAL_TABLET | Freq: Two times a day (BID) | ORAL | 0 refills | Status: DC

## 2022-04-27 ENCOUNTER — Ambulatory Visit: Payer: Medicare Other | Admitting: Nurse Practitioner

## 2022-05-06 ENCOUNTER — Ambulatory Visit: Payer: Medicare Other | Admitting: Nurse Practitioner

## 2022-05-17 ENCOUNTER — Ambulatory Visit (INDEPENDENT_AMBULATORY_CARE_PROVIDER_SITE_OTHER): Payer: Medicare Other | Admitting: Nurse Practitioner

## 2022-05-17 VITALS — BP 132/72 | HR 83 | Temp 96.4°F | Ht 62.0 in | Wt 164.0 lb

## 2022-05-17 DIAGNOSIS — E1169 Type 2 diabetes mellitus with other specified complication: Secondary | ICD-10-CM | POA: Diagnosis not present

## 2022-05-17 DIAGNOSIS — I3481 Nonrheumatic mitral (valve) annulus calcification: Secondary | ICD-10-CM | POA: Diagnosis not present

## 2022-05-17 DIAGNOSIS — I700 Atherosclerosis of aorta: Secondary | ICD-10-CM

## 2022-05-17 DIAGNOSIS — F3341 Major depressive disorder, recurrent, in partial remission: Secondary | ICD-10-CM

## 2022-05-17 DIAGNOSIS — Z0000 Encounter for general adult medical examination without abnormal findings: Secondary | ICD-10-CM

## 2022-05-17 DIAGNOSIS — M25572 Pain in left ankle and joints of left foot: Secondary | ICD-10-CM

## 2022-05-17 DIAGNOSIS — N182 Chronic kidney disease, stage 2 (mild): Secondary | ICD-10-CM

## 2022-05-17 DIAGNOSIS — M25571 Pain in right ankle and joints of right foot: Secondary | ICD-10-CM

## 2022-05-17 DIAGNOSIS — Z0001 Encounter for general adult medical examination with abnormal findings: Secondary | ICD-10-CM

## 2022-05-17 DIAGNOSIS — I2510 Atherosclerotic heart disease of native coronary artery without angina pectoris: Secondary | ICD-10-CM | POA: Diagnosis not present

## 2022-05-17 DIAGNOSIS — Z720 Tobacco use: Secondary | ICD-10-CM

## 2022-05-17 DIAGNOSIS — E1122 Type 2 diabetes mellitus with diabetic chronic kidney disease: Secondary | ICD-10-CM

## 2022-05-17 DIAGNOSIS — R6889 Other general symptoms and signs: Secondary | ICD-10-CM

## 2022-05-17 DIAGNOSIS — E559 Vitamin D deficiency, unspecified: Secondary | ICD-10-CM

## 2022-05-17 DIAGNOSIS — Z79899 Other long term (current) drug therapy: Secondary | ICD-10-CM

## 2022-05-17 DIAGNOSIS — Z794 Long term (current) use of insulin: Secondary | ICD-10-CM

## 2022-05-17 DIAGNOSIS — E663 Overweight: Secondary | ICD-10-CM

## 2022-05-17 DIAGNOSIS — E785 Hyperlipidemia, unspecified: Secondary | ICD-10-CM

## 2022-05-17 DIAGNOSIS — K5900 Constipation, unspecified: Secondary | ICD-10-CM

## 2022-05-17 DIAGNOSIS — Z91199 Patient's noncompliance with other medical treatment and regimen due to unspecified reason: Secondary | ICD-10-CM

## 2022-05-17 DIAGNOSIS — D72829 Elevated white blood cell count, unspecified: Secondary | ICD-10-CM

## 2022-05-17 DIAGNOSIS — L02213 Cutaneous abscess of chest wall: Secondary | ICD-10-CM

## 2022-05-17 NOTE — Patient Instructions (Signed)
Preventing Peripheral Vascular Disease  Peripheral vascular disease (PVD) is a condition of the blood vessels caused by the blocking or hardening of the arteries anywhere within the circulatory system beyond the heart. This condition is also called peripheral artery disease (PAD) or poor circulation. Damage happens when fat, cholesterol, and other substances (plaque) build up in the blood and narrow the arteries that carry blood to other areas of the body. This damage is called atherosclerosis. The blood vessels that carry blood from the heart to the legs are most commonly affected. Other commonly affected blood vessels are those in the arms, kidneys, and stomach. How can peripheral vascular disease affect me? PVD affects the way blood flows to areas of your body. It can cause: Intermittent claudication. This is pain, cramps, and weakness in the buttocks, legs, and feet during activity. This resolves with rest. Pain, numbness, tingling, or weakness in areas of poor blood flow. Skin changes, hair loss, and thickened toenails. Open wounds (ulcers) and sores on toes, feet, or legs. The ulcers or sores may take longer than normal to heal. Tissue death in areas with poor blood flow (gangrene). Surgery to remove these areas of dead tissue may be necessary. This may lead to removal of a leg (amputation). PVD also increases your risk of serious health problems, including: Stroke. A "warning stroke" that causes stroke-like symptoms (transient ischemic attack or TIA). Heart disease. Heart attack. What actions can I take to prevent peripheral vascular disease? Your daily lifestyle habits can affect your risk of PVD. To lower your risk: Do not use any products that contain nicotine or tobacco. These products include cigarettes, chewing tobacco, and vaping devices, such as e-cigarettes. If you need help quitting, ask your health care provider. Avoid drinking alcohol. Maintain a healthy body weight. Get  regular exercise as directed by your health care provider. Eat a heart-healthy diet. Choose low-fat, low-sodium foods, and eat plenty of whole grains, fruits, and vegetables. It is also important to manage any long-term (chronic) health conditions that can increase your risk of PVD. These include: Diabetes. Heart disease. High blood pressure. High cholesterol. What can happen if changes are not made? If you do not take steps to prevent PVD, you may develop PVD or serious complications of PVD. This can result in: Intermittent claudication. Night pain or pain at rest. Acute limb ischemia. This is loss of blood flow to a leg. This causes severe pain and numbness. Delayed or poor wound healing. This can lead to infections. If these are not treated properly, they can lead to an amputation. Stroke, heart disease, or heart attack. Where to find more information Learn more about PVD from: Society for Vascular Surgery: vascular.org American Heart Association: www.heart.org National Heart, Lung, and Blood Institute: https://wilson-eaton.com/ Contact a health care provider if you have: New pain in your legs, arms, or other areas of your body. Signs of infection, such as redness or swelling. A sore on your leg or foot that is not healing. Get help right away if you have: Chest pain. Trouble talking or moving one side of your body. These symptoms may represent a serious problem that is an emergency. Do not wait to see if the symptoms will go away. Get medical help right away. Call your local emergency services (911 in the [place] Do not drive yourself to the hospital. Summary PVD is a long-term (chronic) condition that can affect blood vessels throughout the body. PVD can increase your risk of heart disease, heart attack, and stroke. You  can take steps to prevent PVD by making healthy lifestyle choices, like quitting smoking, eating a heart-healthy diet, and exercising regularly. Work with your health care  provider to manage chronic medical conditions that increase your risk of developing PVD. Get help right away if you have chest pain or trouble moving one side of your body. This information is not intended to replace advice given to you by your health care provider. Make sure you discuss any questions you have with your health care provider. Document Revised: 11/19/2019 Document Reviewed: 11/19/2019 Elsevier Patient Education  [place].

## 2022-05-17 NOTE — Progress Notes (Signed)
AWV AND FOLLOW UP  Assessment:    [name] was seen today for follow-up and wellness.  Diagnoses and all orders for this visit:  Annual Medicare Wellness Visit Due annually  Health maintenance reviewed  Aortic atherosclerosis ([place]) Per CT 2019 Control blood pressure, cholesterol, glucose, increase exercise.   3 Vessel CAD Smoking cessation - patient not ready to quit. Denies angina Control blood pressure, cholesterol, glucose, increase exercise.  Recommend bASA, restart try every other day  Mitral valve annular calcificiation Denies concerning sx; stop smoking, control cholesterol and sugars Recommended cardiology evaluation  Malignant neoplasm of colon, unspecified part of colon ([place]) Hx of, s/p resection, has seen [name] Due for colonoscopy - plans to reach out to schedule   Hyperlipidemia associated with type 2 diabetes mellitus ([name]) Discussed lifestyle modifications. Recommended diet heavy in fruits and veggies, omega 3's. Decrease consumption of animal meats, cheeses, and dairy products. Remain active and exercise as tolerated. Continue to monitor. Check lipids/TSH   Type 2 diabetes mellitus with stage 1 chronic kidney disease, with long-term current use of insulin ([name]) Type 2 Diabetes Mellitus - poorly controlled Complicated by non-compliance, med intolerance Continue Dulaglutide/Trulicity Education: Reviewed 'ABCs' of diabetes management  Discussed goals to be met and/or maintained include A1C (<7) Blood pressure (<130/80) Cholesterol (LDL <70) Continue Eye Exam yearly  Continue Dental Exam Q6 mo Discussed dietary recommendations Discussed Physical Activity recommendations Foot exam UTD Check A1C  CKD stage 2 due to type 2 diabetes mellitus ([name]) Discussed how what you eat and drink can aide in kidney protection. Stay well hydrated. Avoid high salt foods. Avoid NSAIDS. Keep BP and BG well controlled.   Take medications as prescribed. Remain  active and exercise as tolerated daily. Maintain weight.  Continue to monitor. Check CMP/GFR/Microablumin   Depression, major, recurrent, in partial remission ([name]) Celexa 20 mg daily, seroquel Follow up sooner if SE or other concerns, continue seroquel Lifestyle discussed: diet/exerise, sleep hygiene, stress management, hydration  Vitamin D deficiency Encouraged taking supplement consistently, check levels next visit   Smoker Discussed risks associated with tobacco use and advised to reduce or quit Patient is not ready to do so, had one completed 2022, but advised to consider strongly Lung cancer screening with low dose CT discussed as recommended by guidelines based on age, number of pack year history.  Discussed risks of screening including but not limited to false positives on xray, further testing or consultation with specialist, and possible false negative CT as well.   Overweight (BMI 25.0-29.9) Discussed appropriate BMI Goal of losing 1 lb per month. Diet modification. Physical activity. Encouraged/praised to build confidence.   Leukocytosis, unspecified type Has recently seen [name]; monitoring q61m Check CBC with Differential/Platelet  Arthralgia of both feet Continue diclofenac gel PRN Discussed SCD's - Vascular may need to order  Anxiety Continue celexa Stress management techniques discussed, increase water, good sleep hygiene discussed, increase exercise, and increase veggies.   Poor compliance Continue to discuss barriers, encourage compliance  Osteoporosis screening Patient declines at this time  Constipation Stay well hydrated Remain active  Medication management All medications discussed and reviewed in full. All questions and concerns regarding medications addressed.    Abscess of chest wall Recurrent and non-healing Refer to general surgery  Orders Placed This Encounter  Procedures   CBC with Differential/Platelet   COMPLETE METABOLIC  PANEL WITH GFR   Lipid panel   Hemoglobin A1c   VITAMIN D 25 Hydroxy (Vit-D Deficiency, Fractures)   Ambulatory referral  to General Surgery    Referral Priority:   Routine    Referral Type:   Surgical    Referral Reason:   Specialty Services Required    Requested Specialty:   General Surgery    Number of Visits Requested:   1      Notify office for further evaluation and treatment, questions or concerns if any reported s/s fail to improve.   The patient was advised to call back or seek an in-person evaluation if any symptoms worsen or if the condition fails to improve as anticipated.   Further disposition pending results of labs. Discussed med's effects and SE's.    I discussed the assessment and treatment plan with the patient. The patient was provided an opportunity to ask questions and all were answered. The patient agreed with the plan and demonstrated an understanding of the instructions.  Discussed med's effects and SE's. Screening labs and tests as requested with regular follow-up as recommended.  I provided 35 minutes of face-to-face time during this encounter including counseling, chart review, and critical decision making was preformed.  Future Appointments  Date Time Provider [place]  08/19/2022  3:00 PM Darrol Jump, NP [facility] None  05/18/2023  3:00 PM Reniya Mcclees, Kenney Houseman, NP [facility] None     Plan:   During the course of the visit the patient was educated and counseled about appropriate screening and preventive services including:   Pneumococcal vaccine  Prevnar 13 Influenza vaccine Td vaccine Screening electrocardiogram Bone densitometry screening Colorectal cancer screening Diabetes screening Glaucoma screening Nutrition counseling  Advanced directives: requested   Subjective:  Dorothy Boyd is a 66 y.o. female who presents for AWV and 3 month follow up. She has Leukocytosis; Hyperlipidemia associated with type 2 diabetes mellitus ([name]);  Diabetes ([name]); Vitamin D deficiency; Anxiety; Overweight (BMI 25.0-29.9); Arthralgia of both feet; Depression, major, recurrent, in partial remission ([name]); Tobacco abuse; CKD stage 2 due to type 2 diabetes mellitus ([name]); Aortic atherosclerosis ([place]); Poor compliance; Mitral valve annular calcification; LAFB (left anterior fascicular block); 3-vessel CAD (per CT chest 11/2019); Constipation; Medication nonadherence due to intolerance; Calcification of abdominal aorta ([place]); Adrenal adenoma, left; History of colon cancer; Diabetic peripheral neuropathy associated with type 2 diabetes mellitus ([place]); B12 deficiency; and History of adenomatous polyp of colon on their problem list.  Overall she reports feeling well.  Concerned for an area between mid chest that is an unhealing abscess.  She has had past treatment with abx, warm compresses, but area keeps reoccurring after healing.  Painful at times.  Currently healed but feels as though starting to reoccur.    She does have intermittent pain in BLE and feet d/t PVD/DM.  Admits to wearing compression stockings.    She is married, 2 children, 5 grandchildren. She is retired Medical sales representative work.   She has hx of colon cancer in 2010, s/p resection and established with [name]; she has had unexplained leukocytosis and microcytosis documented since 2010.  Etiology remains unclear with possible myeloproliferative disorder and was referred back to Oncology for evaluation and was felt to be stable in 02/18/2021 by [name], was recommended monitoring and follow up only if trending up.   She is recommended colonoscopy follow up for colon cancer screening, she went to [place] earlier this year, reports had colonoscopy early 2022 but not full prep, did remove some polyps, was recommended 1 year follow up.   she has a diagnosis of depression and is currently on celexa 20 mg daily  daily for mood and doing well. Also on seroquel and finds this helpful for sleep,  failed trazodone and gabeptnin. She has been on xanax, takes 0.25 mg as needed for sleep a few days a week.   She continues to smoke,  has 30+ year smoking history, since 1978. Denies cough, dyspnea. She had CT lung cancer screening 12/2019 without concerning nodule but suggestive of small airway disease.   BMI is Body mass index is 30 kg/m., she has not been working on diet and exercise.  Wt Readings from Last 3 Encounters:  05/17/22 164 lb (74.4 kg)  01/20/22 161 lb (73 kg)  10/15/21 162 lb (73.5 kg)   Has aortic atherosclerosis and 3 vessel CAD per CT 11/2017, 11/2019.  CT chest showed dilated pulm arteries; underwent ECHO 03/06/2020 which showed normal pulmonary artery pressure, no atypical dilation, no hypertrophy. Did incidentally note mitral valve has moderate to severe calcification (hardening) - no regurgitation or noted stenosis. Also known 3 vessel CAD per CT chest. She was referred to cardiology early 2022 but had to postpone, receptive to referral back today.  She does have BP cuff, 120s/70s at home is BP: 132/72 She does workout. She denies chest pain, shortness of breath, dizziness.   She is on cholesterol medication (rosuvastatin 40 mg daily and fenofibrate) and denies myalgias. Her cholesterol is not at goal. The cholesterol last visit was:   Lab Results  Component Value Date   CHOL 125 01/20/2022   HDL 33 (L) 01/20/2022   LDLCALC 65 01/20/2022   TRIG 198 (H) 01/20/2022   CHOLHDL 3.8 01/20/2022   She has had diabetes for 10 + years.   She has been working on diet  Patient does NOT check glucose - needle phobia and adamantly declines, insulin Admittedly poorly compliant with lifestyle and medications due to intolerance, typically GI sx Continues Trulicity Last Z6X in the office was:  Lab Results  Component Value Date   HGBA1C 7.7 (H) 01/20/2022   She has CKD I/II associated with T2DM monitored at this office:  Lab Results  Component Value Date   GFRNONAA 78  06/24/2020   Patient is on Vitamin D supplement, 5000 IU gummies, when she remembers  Lab Results  Component Value Date   VD25OH 39 01/20/2022        Medication Review: Current Outpatient Medications on File Prior to Visit  Medication Sig Dispense Refill   ALPRAZolam (XANAX) 0.5 MG tablet TAKE 1/2 TO 1 TABLET BY MOUTH AT BEDTIME AS NEEDED FOR SLEEP OR ANXIETY 30 tablet 0   citalopram (CELEXA) 20 MG tablet Take  1 tablet  Daily for Mood                                                              /                            TAKE                           BY                        MOUTH  ONCE DAILY 90 tablet 3   diclofenac Sodium (VOLTAREN) 1 % GEL Apply 1 application topically 4 (four) times daily as needed.     Dulaglutide (TRULICITY) 5.39 JQ/7.3AL SOPN Inject 0.75 mg into the skin once a week. 2 mL 0   glimepiride (AMARYL) 4 MG tablet Take 1 tablet (4 mg total) by mouth 2 (two) times daily with a meal. 180 tablet 0   nystatin cream (MYCOSTATIN) Apply 1 application topically 2 (two) times daily. 30 g 1   ondansetron (ZOFRAN) 4 MG tablet Take 1 tablet (4 mg total) by mouth every 8 (eight) hours as needed for nausea or vomiting. 60 tablet 1   promethazine (PHENERGAN) 25 MG tablet Take 1 tablet (25 mg total) by mouth every 6 (six) hours as needed for nausea or vomiting. 30 tablet 3   QUEtiapine (SEROQUEL) 25 MG tablet TAKE 1 TABLET BY MOUTH AT BEDTIME AS NEEDED FOR SLEEP 90 tablet 3   triamcinolone ointment (KENALOG) 0.1 % Apply 1 application topically 2 (two) times daily. 80 g 1   aspirin EC 81 MG tablet Take 1 tablet (81 mg total) by mouth daily. Swallow whole. (Patient not taking: Reported on 10/15/2021)  0   CHOLECALCIFEROL PO Take 5,000 Units by mouth daily. (Patient not taking: Reported on 05/17/2022)     Cyanocobalamin (B-12) 500 MCG SUBL Place 1 tablet under the tongue daily. (Patient not taking: Reported on 05/17/2022)     lactulose (CHRONULAC) 10 GM/15ML  solution Take 1-2 tablespoons (15-30 ml) daily as needed for constipation. (Patient not taking: Reported on 05/17/2022) 240 mL 3   metFORMIN (GLUCOPHAGE-XR) 500 MG 24 hr tablet Take 500 mg by mouth daily with supper. (Patient not taking: Reported on 05/17/2022)     rosuvastatin (CRESTOR) 40 MG tablet Take  1 tablet  Daily  for Cholesterol (Patient not taking: Reported on 01/20/2022) 90 tablet 3   No current facility-administered medications on file prior to visit.    Allergies  Allergen Reactions   Codeine    Glipizide Nausea And Vomiting   Lunesta [Eszopiclone]     Excessive fatigue   Metformin And Related     Nausea and abdominal pain   Welchol [Colesevelam Hcl]     nausea      Current Problems (verified) Patient Active Problem List   Diagnosis Date Noted   History of adenomatous polyp of colon 09/18/2021   B12 deficiency 07/15/2021   Diabetic peripheral neuropathy associated with type 2 diabetes mellitus ([place]) 07/14/2021   History of colon cancer 07/10/2021   Calcification of abdominal aorta ([place]) 07/02/2020   Adrenal adenoma, left 07/02/2020   Medication nonadherence due to intolerance 06/25/2020   LAFB (left anterior fascicular block) 06/24/2020   3-vessel CAD (per CT chest 11/2019) 06/24/2020   Constipation 06/24/2020   Mitral valve annular calcification 03/07/2020   Poor compliance 12/07/2019   Aortic atherosclerosis ([place]) 08/01/2019   CKD stage 2 due to type 2 diabetes mellitus ([place]) 07/20/2019   Tobacco abuse 04/16/2019   Depression, major, recurrent, in partial remission ([place]) 01/10/2019   Arthralgia of both feet 10/04/2018   Overweight (BMI 25.0-29.9) 09/29/2017   Hyperlipidemia associated with type 2 diabetes mellitus ([place])    Diabetes ([place])    Vitamin D deficiency    Anxiety    Leukocytosis 09/18/2012    Screening Tests Immunization History  Administered Date(s) Administered   Pneumococcal Polysaccharide-23 01/07/2020     Preventative care: Last  colonoscopy: 2010 Dr. Earlean Shawl, overdue, hx of colon cancer,  patient [facility] 2022 - 2023 Last mammogram: 05/2021, given phone number to schedule Last pap smear/pelvic exam: 05/2013, hx of abnormal but had normal x 2 last checks, declines further DEXA: ordered but patient declines to schedule at this time   Prior vaccinations: TD or Tdap: declines   Influenza: declines Pneumococcal: 12/2019 Prevnar13: declines Shingles/Zostavax: declines  Covid 19: declines   Names of Other Physician/Practitioners you currently use: 1. [facility] here for primary care 2. [facility], eye doctor, last visit 08/2019, reminded to schedule this year 3. Dental works, [employer], Pharmacist, community, last visit 2020, declines follow up, has full dentures  Patient Care Team: Unk Pinto, MD as PCP - General (Internal Medicine) Wyatt Portela, MD as Consulting Physician (Oncology)  SURGICAL HISTORY She  has a past surgical history that includes Appendectomy; Partial colectomy (2010); Laparoscopic cholecystectomy (2010); Colonoscopy; and Breast biopsy (Left, 06/10/2016). FAMILY HISTORY Her family history includes Cancer - Ovarian in her mother; Diabetes in her paternal grandmother and sister; Gastric cancer in her paternal aunt; Heart disease in her brother; Hyperlipidemia in her mother and sister; Hypertension in her mother. SOCIAL HISTORY She  reports that she has been smoking cigarettes. She started smoking about 45 years ago. She has a 33.00 pack-year smoking history. She has never used smokeless tobacco. She reports that she does not drink alcohol and does not use drugs.  Depression/mood screen:      07/14/2021    3:39 PM  Depression screen PHQ 2/9  Decreased Interest 0  Down, Depressed, Hopeless 0  PHQ - 2 Score 0     Review of Systems  Constitutional:  Negative for malaise/fatigue and weight loss.  HENT:  Negative for hearing loss and tinnitus.   Eyes:   Negative for blurred vision and double vision.  Respiratory:  Negative for cough, sputum production, shortness of breath and wheezing.   Cardiovascular:  Negative for chest pain, palpitations, orthopnea, claudication, leg swelling and PND.  Gastrointestinal:  Positive for constipation (chronic). Negative for abdominal pain, blood in stool, diarrhea, heartburn, melena, nausea and vomiting.  Genitourinary: Negative.   Musculoskeletal:  Negative for falls (1 fall in 12 months ), joint pain (bil feet, improved) and myalgias.  Skin:  Negative for rash.  Neurological:  Negative for dizziness, tingling, sensory change, weakness and headaches.  Endo/Heme/Allergies:  Negative for polydipsia.  Psychiatric/Behavioral:  Negative for depression, memory loss, substance abuse and suicidal ideas. The patient is not nervous/anxious and does not have insomnia.   All other systems reviewed and are negative.    Objective:     Today's Vitals   05/17/22 1505  BP: 132/72  Pulse: 83  Temp: (!) 96.4 F (35.8 C)  SpO2: 97%  Weight: 164 lb (74.4 kg)  Height: _0  (1.575 m)   Body mass index is 30 kg/m.  General appearance: alert, no distress, WD/WN, female HEENT: normocephalic, sclerae anicteric, TMs pearly, nares patent, no discharge or erythema, pharynx normal Oral cavity: MMM, no lesions Neck: supple, no lymphadenopathy, no thyromegaly, no masses Heart: RRR, normal S1, S2, no murmurs  Lungs: CTA bilaterally, no wheezes, rhonchi, or rales Abdomen: +bs, firm, mildly distended, generalized tenderness with light palpation, no distinct palpable masses or organomegaly; tenderness limiting exam Musculoskeletal: nontender, no swelling, no obvious deformity Extremities: no edema, no cyanosis, no clubbing Pulses: 2+ symmetric, upper and lower extremities, normal cap refill Neurological: alert, oriented x 3, CN2-12 intact, strength normal upper extremities and lower extremities, sensation normal throughout,  DTRs 2+ throughout, no cerebellar signs, gait slow steady Psychiatric: normal affect, behavior normal, pleasant     Medicare Attestation I have personally reviewed: The patient's medical and social history Their use of alcohol, tobacco or illicit drugs Their current medications and supplements The patient's functional ability including ADLs,fall risks, home safety risks, cognitive, and hearing and visual impairment Diet and physical activities Evidence for depression or mood disorders  The patient's weight, height, BMI, and visual acuity have been recorded in the chart.  I have made referrals, counseling, and provided education to the patient based on review of the above and I have provided the patient with a written personalized care plan for preventive services.     Darrol Jump, NP   05/17/2022

## 2022-05-18 LAB — CBC WITH DIFFERENTIAL/PLATELET
Absolute Monocytes: 628 cells/uL (ref 200–950)
Basophils Absolute: 113 cells/uL (ref 0–200)
Basophils Relative: 0.7 %
Eosinophils Absolute: 386 cells/uL (ref 15–500)
Eosinophils Relative: 2.4 %
HCT: 40.9 % (ref 35.0–45.0)
Hemoglobin: 12.8 g/dL (ref 11.7–15.5)
Lymphs Abs: 5152 cells/uL — ABNORMAL HIGH (ref 850–3900)
MCH: 19.2 pg — ABNORMAL LOW (ref 27.0–33.0)
MCHC: 31.3 g/dL — ABNORMAL LOW (ref 32.0–36.0)
MCV: 61.3 fL — ABNORMAL LOW (ref 80.0–100.0)
MPV: 10.1 fL (ref 7.5–12.5)
Monocytes Relative: 3.9 %
Neutro Abs: 9821 cells/uL — ABNORMAL HIGH (ref 1500–7800)
Neutrophils Relative %: 61 %
Platelets: 450 10*3/uL — ABNORMAL HIGH (ref 140–400)
RBC: 6.67 10*6/uL — ABNORMAL HIGH (ref 3.80–5.10)
RDW: 18.4 % — ABNORMAL HIGH (ref 11.0–15.0)
Total Lymphocyte: 32 %
WBC: 16.1 10*3/uL — ABNORMAL HIGH (ref 3.8–10.8)

## 2022-05-18 LAB — HEMOGLOBIN A1C
Hgb A1c MFr Bld: 7.9 % of total Hgb — ABNORMAL HIGH (ref <5.7)
Mean Plasma Glucose: 180 mg/dL
eAG (mmol/L): 10 mmol/L

## 2022-05-18 LAB — COMPLETE METABOLIC PANEL WITH GFR
AG Ratio: 1.5 (calc) (ref 1.0–2.5)
ALT: 12 U/L (ref 6–29)
AST: 14 U/L (ref 10–35)
Albumin: 4.6 g/dL (ref 3.6–5.1)
Alkaline phosphatase (APISO): 89 U/L (ref 37–153)
BUN: 15 mg/dL (ref 7–25)
CO2: 27 mmol/L (ref 20–32)
Calcium: 10.3 mg/dL (ref 8.6–10.4)
Chloride: 101 mmol/L (ref 98–110)
Creat: 0.85 mg/dL (ref 0.50–1.05)
Globulin: 3 g/dL (calc) (ref 1.9–3.7)
Glucose, Bld: 137 mg/dL — ABNORMAL HIGH (ref 65–99)
Potassium: 4.4 mmol/L (ref 3.5–5.3)
Sodium: 138 mmol/L (ref 135–146)
Total Bilirubin: 0.4 mg/dL (ref 0.2–1.2)
Total Protein: 7.6 g/dL (ref 6.1–8.1)
eGFR: 75 mL/min/{1.73_m2} (ref >=60)

## 2022-05-18 LAB — LIPID PANEL
Cholesterol: 223 mg/dL — ABNORMAL HIGH (ref <200)
HDL: 34 mg/dL — ABNORMAL LOW (ref >=50)
LDL Cholesterol (Calc): 146 mg/dL (calc) — ABNORMAL HIGH
Non-HDL Cholesterol (Calc): 189 mg/dL (calc) — ABNORMAL HIGH (ref <130)
Total CHOL/HDL Ratio: 6.6 (calc) — ABNORMAL HIGH (ref <5.0)
Triglycerides: 302 mg/dL — ABNORMAL HIGH (ref <150)

## 2022-05-18 LAB — VITAMIN D 25 HYDROXY (VIT D DEFICIENCY, FRACTURES): Vit D, 25-Hydroxy: 23 ng/mL — ABNORMAL LOW (ref 30–100)

## 2022-06-01 ENCOUNTER — Other Ambulatory Visit: Payer: Self-pay | Admitting: Nurse Practitioner

## 2022-06-01 ENCOUNTER — Other Ambulatory Visit: Payer: Self-pay | Admitting: Internal Medicine

## 2022-06-01 DIAGNOSIS — F3341 Major depressive disorder, recurrent, in partial remission: Secondary | ICD-10-CM

## 2022-06-01 DIAGNOSIS — G4700 Insomnia, unspecified: Secondary | ICD-10-CM

## 2022-06-01 DIAGNOSIS — E1169 Type 2 diabetes mellitus with other specified complication: Secondary | ICD-10-CM

## 2022-06-01 MED ORDER — QUETIAPINE FUMARATE 25 MG PO TABS: ORAL_TABLET | ORAL | 0 refills | Status: DC

## 2022-06-24 ENCOUNTER — Other Ambulatory Visit: Payer: Self-pay

## 2022-06-24 ENCOUNTER — Encounter (HOSPITAL_COMMUNITY): Payer: Self-pay | Admitting: Surgery

## 2022-06-25 ENCOUNTER — Ambulatory Visit: Payer: Self-pay | Admitting: Surgery

## 2022-06-28 ENCOUNTER — Encounter (HOSPITAL_COMMUNITY): Payer: Self-pay | Admitting: Surgery

## 2022-06-28 ENCOUNTER — Encounter (HOSPITAL_COMMUNITY)
Admission: RE | Disposition: A | Discharge status: Home / Self Care | Payer: Self-pay | Source: Home / Self Care | Attending: Surgery

## 2022-06-28 ENCOUNTER — Other Ambulatory Visit: Payer: Self-pay

## 2022-06-28 ENCOUNTER — Ambulatory Visit (HOSPITAL_BASED_OUTPATIENT_CLINIC_OR_DEPARTMENT_OTHER): Payer: Medicare Other | Admitting: Anesthesiology

## 2022-06-28 ENCOUNTER — Ambulatory Visit (HOSPITAL_COMMUNITY)
Admission: RE | Admit: 2022-06-28 | Discharge: 2022-06-28 | Disposition: A | Discharge status: Home / Self Care | Payer: Medicare Other | Attending: Surgery | Admitting: Surgery

## 2022-06-28 ENCOUNTER — Ambulatory Visit (HOSPITAL_COMMUNITY): Payer: Medicare Other | Admitting: Anesthesiology

## 2022-06-28 DIAGNOSIS — F32A Depression, unspecified: Secondary | ICD-10-CM | POA: Insufficient documentation

## 2022-06-28 DIAGNOSIS — Z85038 Personal history of other malignant neoplasm of large intestine: Secondary | ICD-10-CM | POA: Diagnosis not present

## 2022-06-28 DIAGNOSIS — M799 Soft tissue disorder, unspecified: Secondary | ICD-10-CM

## 2022-06-28 DIAGNOSIS — E119 Type 2 diabetes mellitus without complications: Secondary | ICD-10-CM | POA: Insufficient documentation

## 2022-06-28 DIAGNOSIS — R222 Localized swelling, mass and lump, trunk: Secondary | ICD-10-CM | POA: Diagnosis present

## 2022-06-28 DIAGNOSIS — F17210 Nicotine dependence, cigarettes, uncomplicated: Secondary | ICD-10-CM | POA: Insufficient documentation

## 2022-06-28 DIAGNOSIS — G709 Myoneural disorder, unspecified: Secondary | ICD-10-CM | POA: Diagnosis not present

## 2022-06-28 DIAGNOSIS — Z7984 Long term (current) use of oral hypoglycemic drugs: Secondary | ICD-10-CM | POA: Diagnosis not present

## 2022-06-28 DIAGNOSIS — E785 Hyperlipidemia, unspecified: Secondary | ICD-10-CM | POA: Insufficient documentation

## 2022-06-28 DIAGNOSIS — L720 Epidermal cyst: Secondary | ICD-10-CM | POA: Diagnosis not present

## 2022-06-28 DIAGNOSIS — F419 Anxiety disorder, unspecified: Secondary | ICD-10-CM | POA: Diagnosis not present

## 2022-06-28 DIAGNOSIS — I2510 Atherosclerotic heart disease of native coronary artery without angina pectoris: Secondary | ICD-10-CM | POA: Insufficient documentation

## 2022-06-28 HISTORY — DX: Pneumonia, unspecified organism: J18.9

## 2022-06-28 HISTORY — DX: Anemia, unspecified: D64.9

## 2022-06-28 HISTORY — PX: MASS EXCISION: SHX2000

## 2022-06-28 LAB — CBC
HCT: 38.7 % (ref 36.0–46.0)
Hemoglobin: 12.1 g/dL (ref 12.0–15.0)
MCH: 19.8 pg — ABNORMAL LOW (ref 26.0–34.0)
MCHC: 31.3 g/dL (ref 30.0–36.0)
MCV: 63.2 fL — ABNORMAL LOW (ref 80.0–100.0)
Platelets: 357 10*3/uL (ref 150–400)
RBC: 6.12 MIL/uL — ABNORMAL HIGH (ref 3.87–5.11)
RDW: 17.2 % — ABNORMAL HIGH (ref 11.5–15.5)
WBC: 13.8 10*3/uL — ABNORMAL HIGH (ref 4.0–10.5)
nRBC: 0 % (ref 0.0–0.2)

## 2022-06-28 LAB — BASIC METABOLIC PANEL
Anion gap: 11 (ref 5–15)
BUN: 12 mg/dL (ref 8–23)
CO2: 21 mmol/L — ABNORMAL LOW (ref 22–32)
Calcium: 9.1 mg/dL (ref 8.9–10.3)
Chloride: 104 mmol/L (ref 98–111)
Creatinine, Ser: 0.79 mg/dL (ref 0.44–1.00)
GFR, Estimated: >60 mL/min (ref >60)
Glucose, Bld: 170 mg/dL — ABNORMAL HIGH (ref 70–99)
Potassium: 4.2 mmol/L (ref 3.5–5.1)
Sodium: 136 mmol/L (ref 135–145)

## 2022-06-28 LAB — GLUCOSE, CAPILLARY
Glucose-Capillary: 192 mg/dL — ABNORMAL HIGH (ref 70–99)
Glucose-Capillary: 121 mg/dL — ABNORMAL HIGH (ref 70–99)

## 2022-06-28 SURGERY — EXCISION MASS
Anesthesia: General

## 2022-06-28 MED ORDER — CHLORHEXIDINE GLUCONATE CLOTH 2 % EX PADS: 6.0000 | MEDICATED_PAD | Freq: Once | CUTANEOUS | Status: DC

## 2022-06-28 MED ORDER — DOCUSATE SODIUM 100 MG PO CAPS: 100.0000 mg | ORAL_CAPSULE | Freq: Two times a day (BID) | ORAL | 2 refills | Status: AC

## 2022-06-28 MED ORDER — FENTANYL CITRATE (PF) 250 MCG/5ML IJ SOLN
INTRAMUSCULAR | Status: DC | PRN
  Administered 2022-06-28: 50 ug via INTRAVENOUS

## 2022-06-28 MED ORDER — IBUPROFEN 600 MG PO TABS: 600.0000 mg | ORAL_TABLET | Freq: Four times a day (QID) | ORAL | 1 refills | Status: DC

## 2022-06-28 MED ORDER — LACTATED RINGERS IV SOLN: INTRAVENOUS | Status: DC

## 2022-06-28 MED ORDER — MIDAZOLAM HCL 2 MG/2ML IJ SOLN
INTRAMUSCULAR | Status: DC | PRN
  Administered 2022-06-28: 2 mg via INTRAVENOUS

## 2022-06-28 MED ORDER — ENOXAPARIN SODIUM 40 MG/0.4ML IJ SOSY
40.0000 mg | PREFILLED_SYRINGE | Freq: Once | INTRAMUSCULAR | Status: AC
  Administered 2022-06-28: 40 mg via SUBCUTANEOUS
  Filled 2022-06-28: qty 0.4

## 2022-06-28 MED ORDER — CEFAZOLIN SODIUM-DEXTROSE 2-4 GM/100ML-% IV SOLN
2.0000 g | INTRAVENOUS | Status: AC
  Administered 2022-06-28: 2 g via INTRAVENOUS
  Filled 2022-06-28: qty 100

## 2022-06-28 MED ORDER — METHOCARBAMOL 750 MG PO TABS: 750.0000 mg | ORAL_TABLET | Freq: Four times a day (QID) | ORAL | 1 refills | Status: DC

## 2022-06-28 MED ORDER — BUPIVACAINE-EPINEPHRINE (PF) 0.5% -1:200000 IJ SOLN
INTRAMUSCULAR | Status: AC
  Filled 2022-06-28: qty 30

## 2022-06-28 MED ORDER — FENTANYL CITRATE (PF) 100 MCG/2ML IJ SOLN: 25.0000 ug | INTRAMUSCULAR | Status: DC | PRN

## 2022-06-28 MED ORDER — DEXAMETHASONE SODIUM PHOSPHATE 10 MG/ML IJ SOLN
INTRAMUSCULAR | Status: DC | PRN
  Administered 2022-06-28: 5 mg via INTRAVENOUS

## 2022-06-28 MED ORDER — BUPIVACAINE LIPOSOME 1.3 % IJ SUSP
INTRAMUSCULAR | Status: AC
  Filled 2022-06-28: qty 20

## 2022-06-28 MED ORDER — ONDANSETRON HCL 4 MG/2ML IJ SOLN
INTRAMUSCULAR | Status: DC | PRN
  Administered 2022-06-28: 4 mg via INTRAVENOUS

## 2022-06-28 MED ORDER — INSULIN ASPART 100 UNIT/ML IJ SOLN
0.0000 [IU] | INTRAMUSCULAR | Status: DC | PRN
  Administered 2022-06-28: 4 [IU] via SUBCUTANEOUS
  Filled 2022-06-28: qty 1

## 2022-06-28 MED ORDER — FENTANYL CITRATE (PF) 250 MCG/5ML IJ SOLN
INTRAMUSCULAR | Status: AC
  Filled 2022-06-28: qty 5

## 2022-06-28 MED ORDER — LIDOCAINE 2% (20 MG/ML) 5 ML SYRINGE
INTRAMUSCULAR | Status: DC | PRN
  Administered 2022-06-28: 80 mg via INTRAVENOUS

## 2022-06-28 MED ORDER — CHLORHEXIDINE GLUCONATE 0.12 % MT SOLN
15.0000 mL | Freq: Once | OROMUCOSAL | Status: AC
  Administered 2022-06-28: 15 mL via OROMUCOSAL
  Filled 2022-06-28: qty 15

## 2022-06-28 MED ORDER — ACETAMINOPHEN 500 MG PO TABS: 1000.0000 mg | ORAL_TABLET | Freq: Four times a day (QID) | ORAL | 3 refills | Status: AC

## 2022-06-28 MED ORDER — PHENYLEPHRINE 80 MCG/ML (10ML) SYRINGE FOR IV PUSH (FOR BLOOD PRESSURE SUPPORT)
PREFILLED_SYRINGE | INTRAVENOUS | Status: DC | PRN
  Administered 2022-06-28: 160 ug via INTRAVENOUS
  Administered 2022-06-28 (×2): 80 ug via INTRAVENOUS

## 2022-06-28 MED ORDER — PROPOFOL 10 MG/ML IV BOLUS
INTRAVENOUS | Status: DC | PRN
  Administered 2022-06-28: 160 mg via INTRAVENOUS

## 2022-06-28 MED ORDER — BUPIVACAINE LIPOSOME 1.3 % IJ SUSP
INTRAMUSCULAR | Status: DC | PRN
  Administered 2022-06-28: 20 mL

## 2022-06-28 MED ORDER — PROPOFOL 10 MG/ML IV BOLUS
INTRAVENOUS | Status: AC
  Filled 2022-06-28: qty 20

## 2022-06-28 MED ORDER — MIDAZOLAM HCL 2 MG/2ML IJ SOLN
INTRAMUSCULAR | Status: AC
  Filled 2022-06-28: qty 2

## 2022-06-28 MED ORDER — BUPIVACAINE LIPOSOME 1.3 % IJ SUSP: 20.0000 mL | Freq: Once | INTRAMUSCULAR | Status: DC

## 2022-06-28 MED ORDER — EPHEDRINE SULFATE-NACL 50-0.9 MG/10ML-% IV SOSY
PREFILLED_SYRINGE | INTRAVENOUS | Status: DC | PRN
  Administered 2022-06-28: 10 mg via INTRAVENOUS
  Administered 2022-06-28 (×2): 5 mg via INTRAVENOUS

## 2022-06-28 MED ORDER — OXYCODONE HCL 5 MG PO TABS: 5.0000 mg | ORAL_TABLET | ORAL | 0 refills | Status: DC | PRN

## 2022-06-28 MED ORDER — ORAL CARE MOUTH RINSE: 15.0000 mL | Freq: Once | OROMUCOSAL | Status: AC

## 2022-06-28 MED ORDER — ONDANSETRON HCL 4 MG/2ML IJ SOLN: 4.0000 mg | Freq: Once | INTRAMUSCULAR | Status: DC | PRN

## 2022-06-28 MED ORDER — BUPIVACAINE-EPINEPHRINE 0.5% -1:200000 IJ SOLN
INTRAMUSCULAR | Status: DC | PRN
  Administered 2022-06-28: 10 mL

## 2022-06-28 SURGICAL SUPPLY — 38 items
ADH SKN CLS APL DERMABOND .7 (GAUZE/BANDAGES/DRESSINGS) ×1
APL PRP STRL LF DISP 70% ISPRP (MISCELLANEOUS) ×1
BAG COUNTER SPONGE SURGICOUNT (BAG) ×1 IMPLANT
BAG SPNG CNTER NS LX DISP (BAG) ×1
CANISTER SUCT 3000ML PPV (MISCELLANEOUS) ×1 IMPLANT
CHLORAPREP W/TINT 26 (MISCELLANEOUS) ×1 IMPLANT
COVER SURGICAL LIGHT HANDLE (MISCELLANEOUS) ×1 IMPLANT
DERMABOND ADVANCED .7 DNX12 (GAUZE/BANDAGES/DRESSINGS) ×1 IMPLANT
DRAPE LAPAROSCOPIC ABDOMINAL (DRAPES) IMPLANT
DRAPE LAPAROTOMY 100X72 PEDS (DRAPES) IMPLANT
DRSG TEGADERM 4X4.75 (GAUZE/BANDAGES/DRESSINGS) IMPLANT
ELECT REM PT RETURN 9FT ADLT (ELECTROSURGICAL) ×1
ELECTRODE REM PT RTRN 9FT ADLT (ELECTROSURGICAL) ×1 IMPLANT
GAUZE 4X4 16PLY ~~LOC~~+RFID DBL (SPONGE) IMPLANT
GAUZE SPONGE 4X4 12PLY STRL (GAUZE/BANDAGES/DRESSINGS) IMPLANT
GLOVE BIO SURGEON STRL SZ 6.5 (GLOVE) ×1 IMPLANT
GLOVE BIOGEL PI IND STRL 6 (GLOVE) ×1 IMPLANT
GOWN STRL REUS W/ TWL LRG LVL3 (GOWN DISPOSABLE) ×1 IMPLANT
GOWN STRL REUS W/TWL LRG LVL3 (GOWN DISPOSABLE) ×1
KIT BASIN OR (CUSTOM PROCEDURE TRAY) ×1 IMPLANT
KIT TURNOVER KIT B (KITS) ×1 IMPLANT
MARKER SKIN DUAL TIP RULER LAB (MISCELLANEOUS) ×1 IMPLANT
NDL HYPO 25GX1X1/2 BEV (NEEDLE) ×1 IMPLANT
NEEDLE HYPO 25GX1X1/2 BEV (NEEDLE) ×1 IMPLANT
NS IRRIG 1000ML POUR BTL (IV SOLUTION) ×1 IMPLANT
PACK GENERAL/GYN (CUSTOM PROCEDURE TRAY) ×1 IMPLANT
PAD ARMBOARD 7.5X6 YLW CONV (MISCELLANEOUS) ×2 IMPLANT
PENCIL SMOKE EVACUATOR (MISCELLANEOUS) IMPLANT
SPECIMEN JAR SMALL (MISCELLANEOUS) ×1 IMPLANT
STRIP CLOSURE SKIN 1/2X4 (GAUZE/BANDAGES/DRESSINGS) IMPLANT
SUT MNCRL AB 4-0 PS2 18 (SUTURE) ×1 IMPLANT
SUT SILK 2 0 PERMA HAND 18 BK (SUTURE) IMPLANT
SUT VIC AB 3-0 SH 18 (SUTURE) IMPLANT
SUT VIC AB 3-0 SH 27 (SUTURE) ×1
SUT VIC AB 3-0 SH 27X BRD (SUTURE) ×1 IMPLANT
SYR CONTROL 10ML LL (SYRINGE) ×1 IMPLANT
TOWEL GREEN STERILE (TOWEL DISPOSABLE) ×1 IMPLANT
TOWEL GREEN STERILE FF (TOWEL DISPOSABLE) ×1 IMPLANT

## 2022-06-28 NOTE — Transfer of Care (Signed)
Immediate Anesthesia Transfer of Care Note  Patient: Dorothy Boyd  Procedure(s) Performed: EXCISION OF SOFT TISSUE MASS INTERMAMMARY FOLD  Patient Location: PACU  Anesthesia Type:General  Level of Consciousness: drowsy  Airway & Oxygen Therapy: Patient Spontanous Breathing and Patient connected to face mask oxygen  Post-op Assessment: Report given to RN and Post -op Vital signs reviewed and stable  Post vital signs: Reviewed and stable  Last Vitals:  Vitals Value Taken Time  BP 111/49 06/28/22 1200  Temp 36.4 C 06/28/22 1200  Pulse 78 06/28/22 1202  Resp 8 06/28/22 1202  SpO2 100 % 06/28/22 1202  Vitals shown include unvalidated device data.  Last Pain:  Vitals:   06/28/22 0921  TempSrc: Oral  PainSc: 0-No pain         Complications: No notable events documented.

## 2022-06-28 NOTE — Anesthesia Preprocedure Evaluation (Addendum)
Anesthesia Evaluation  Patient identified by MRN, date of birth, ID band Patient awake    Reviewed: Allergy & Precautions, NPO status , Patient's Chart, lab work & pertinent test results  Airway Mallampati: II  TM Distance: >3 FB Neck ROM: Full    Dental  (+) Dental Advisory Given, Edentulous Lower   Pulmonary Current Smoker and Patient abstained from smoking.   Pulmonary exam normal breath sounds clear to auscultation       Cardiovascular + CAD  Normal cardiovascular exam+ dysrhythmias (LAFB)  Rhythm:Regular Rate:Normal     Neuro/Psych  PSYCHIATRIC DISORDERS Anxiety Depression     Neuromuscular disease    GI/Hepatic Neg liver ROS,,,Colon cancer    Endo/Other  diabetes, Type 2, Oral Hypoglycemic Agents    Renal/GU Renal InsufficiencyRenal disease     Musculoskeletal negative musculoskeletal ROS (+)    Abdominal   Peds  Hematology negative hematology ROS (+)   Anesthesia Other Findings Day of surgery medications reviewed with the patient.  SOFT TISSUE MASS  Reproductive/Obstetrics                             Anesthesia Physical Anesthesia Plan  ASA: 2  Anesthesia Plan: General   Post-op Pain Management: Tylenol PO (pre-op)*   Induction: Intravenous  PONV Risk Score and Plan: 2 and Midazolam, Dexamethasone and Ondansetron  Airway Management Planned: LMA  Additional Equipment:   Intra-op Plan:   Post-operative Plan: Extubation in OR  Informed Consent: I have reviewed the patients History and Physical, chart, labs and discussed the procedure including the risks, benefits and alternatives for the proposed anesthesia with the patient or authorized representative who has indicated his/her understanding and acceptance.     Dental advisory given  Plan Discussed with: CRNA  Anesthesia Plan Comments:         Anesthesia Quick Evaluation

## 2022-06-28 NOTE — Anesthesia Procedure Notes (Signed)
Procedure Name: LMA Insertion Date/Time: 06/28/2022 11:12 AM  Performed by: Elvin So, CRNAPre-anesthesia Checklist: Patient identified, Emergency Drugs available, Suction available and Patient being monitored Patient Re-evaluated:Patient Re-evaluated prior to induction Oxygen Delivery Method: Circle System Utilized Preoxygenation: Pre-oxygenation with 100% oxygen Induction Type: IV induction Ventilation: Mask ventilation without difficulty LMA: LMA inserted LMA Size: 4.0 Number of attempts: 1 Airway Equipment and Method: Bite block Placement Confirmation: positive ETCO2 Tube secured with: Tape Dental Injury: Teeth and Oropharynx as per pre-operative assessment

## 2022-06-28 NOTE — Op Note (Signed)
   Operative Note   Date: 06/28/2022  Procedure: excision of soft tissue mass of chest wall at intermammary fold  Pre-op diagnosis: soft tissue mass of chest wall at intermammary fold Post-op diagnosis: soft tissue mass of chest wall at intermammary fold, 1.5x1.5cm   Indication and clinical history: The patient is a 67 y.o. year old female with soft tissue mass of chest wall at intermammary fold     Surgeon: Jesusita Oka, MD  Anesthesiologist: Gifford Shave, MD Anesthesia: General  Findings:  Specimen: soft tissue mass of chest wall EBL: <5cc Drains/Implants: none  Disposition: PACU - hemodynamically stable.  Description of procedure: The patient was positioned supine on the operating room table. General anesthetic induction and intubation were uneventful. Foley catheter insertion was performed and was atraumatic. Time-out was performed verifying correct patient, procedure, signature of informed consent, and administration of pre-operative antibiotics. The chest wall was prepped and draped in the usual sterile fashion.  An elliptical incision was made around the mass and was circumferentially excised. The area of resection was sent to pathology as a permanent specimen. The wound was closed in layers using 3-0 vicryl suture deep and 4-0 monocryl subcuticular. The wound was dressed with dermabond.  All sponge and instrument counts were correct at the conclusion of the procedure. The patient was awakened from anesthesia, extubated uneventfully, and transported to the PACU in good condition. There were no complications.    Jesusita Oka, MD General and [facility]

## 2022-06-28 NOTE — H&P (Addendum)
Dorothy Boyd is an 67 y.o. female.   HPI: 43F with soft tissue mass in intermammary fold. Plan for excision today. The patient has had no hospitalizations, doctors visits, ER visits, surgeries, or newly diagnosed allergies since being seen in the office.    Past Medical History:  Diagnosis Date   Anemia    Anxiety    Cervical dysplasia    Colon cancer ([name])    Depression    Diabetes ([place])    History of colon cancer, stage II 09/18/2012   Hyperlipidemia    Pneumonia    Right-sided Bell's palsy 01/03/2019   Vitamin D deficiency     Past Surgical History:  Procedure Laterality Date   APPENDECTOMY     BREAST BIOPSY Left 06/10/2016   COLONOSCOPY     LAPAROSCOPIC CHOLECYSTECTOMY  2010   PARTIAL COLECTOMY  2010   Colon cancer resection, Dr. Rise Patience    Family History  Problem Relation Age of Onset   Hyperlipidemia Mother    Hypertension Mother    Cancer - Ovarian Mother    Diabetes Sister    Hyperlipidemia Sister    Heart disease Brother    Diabetes Paternal Grandmother    Gastric cancer Paternal Aunt     Social History:  reports that she has been smoking cigarettes. She started smoking about 46 years ago. She has a 22.00 pack-year smoking history. She has never used smokeless tobacco. She reports that she does not drink alcohol and does not use drugs.  Allergies:  Allergies  Allergen Reactions   Codeine     Unknown reaction    Glipizide Nausea And Vomiting   Lunesta [Eszopiclone]     Excessive fatigue   Metformin And Related     Nausea and abdominal pain   Welchol [Colesevelam Hcl] Nausea Only    Medications: I have reviewed the patient's current medications.  Results for orders placed or performed during the hospital encounter of 06/28/22 (from the past 48 hour(s))  Glucose, capillary     Status: Abnormal   Collection Time: 06/28/22  9:21 AM  Result Value Ref Range   Glucose-Capillary 192 (H) 70 - 99 mg/dL    Comment: Glucose reference range applies  only to samples taken after fasting for at least 8 hours.  CBC per protocol     Status: Abnormal   Collection Time: 06/28/22  9:41 AM  Result Value Ref Range   WBC 13.8 (H) 4.0 - 10.5 K/uL   RBC 6.12 (H) 3.87 - 5.11 MIL/uL   Hemoglobin 12.1 12.0 - 15.0 g/dL   HCT 38.7 36.0 - 46.0 %   MCV 63.2 (L) 80.0 - 100.0 fL   MCH 19.8 (L) 26.0 - 34.0 pg   MCHC 31.3 30.0 - 36.0 g/dL   RDW 17.2 (H) 11.5 - 15.5 %   Platelets 357 150 - 400 K/uL    Comment: REPEATED TO VERIFY   nRBC 0.0 0.0 - 0.2 %    Comment: Performed at [facility], [place] [address], [place], [place] [address]    No results found.  ROS 10 point review of systems is negative except as listed above in HPI.   Physical Exam Blood pressure (!) 123/59, pulse 78, temperature 98.8 F (37.1 C), temperature source Oral, resp. rate 16, height '5\' 3"'$  (1.6 m), weight 73.5 kg, SpO2 94 %. Constitutional: well-developed, well-nourished HEENT: pupils equal, round, reactive to light, 35m b/l, moist conjunctiva, external inspection of ears and nose normal, hearing intact  Oropharynx: normal oropharyngeal mucosa, poor dentition Neck: no thyromegaly, trachea midline, no midline cervical tenderness to palpation Chest: breath sounds equal bilaterally, normal respiratory effort, no midline or lateral chest wall tenderness to palpation/deformity, 1.5x.15.cm soft tissue mass of intermammary fold Abdomen: soft, NT, no bruising, no hepatosplenomegaly GU: normal female genitalia  Back: no wounds, no thoracic/lumbar spine tenderness to palpation, no thoracic/lumbar spine stepoffs Rectal: deferred Extremities: 2+ radial and pedal pulses bilaterally, intact motor and sensation bilateral UE and LE, no peripheral edema MSK: normal gait/station, no clubbing/cyanosis of fingers/toes, normal ROM of all four extremities Skin: warm, dry, no rashes Psych: normal memory, normal mood/affect     Assessment/Plan: 39F with soft tissue mass of intermammary fold.  Plan for excision today. Informed consent was obtained after detailed explanation of risks, including bleeding, infection, hematoma/seroma, temporary or permanent neuropathy, recurrence. All questions answered to the patient's satisfaction.    Jesusita Oka, MD General and [facility]

## 2022-06-28 NOTE — Discharge Instructions (Signed)
May shower beginning 06/29/2022. Do not peel off or scrub skin glue. May allow warm soapy water to run over incision, then rinse and pat dry. Do not soak in any water (tubs, hot tubs, pools, lakes, oceans) for one week.   No lifting your arms above shoulder level for one week. May resume sexual activity when it is comfortable.   Pain regimen: take over-the-counter tylenol (acetaminophen) '1000mg'$  every six hours, the prescription ibuprofen ('600mg'$ ) every six hours and the robaxin (methocarbamol) '750mg'$  every six hours. With all three of these, you should be taking something every two hours. Example: tylenol ( acetaminophen) at 8am, ibuprofen at 10am, robaxin (methocarbamol) at 12pm, tylenol (acetaminophen) again at 2pm, ibuprofen again at 4pm, robaxin (methocarbamol) at 6pm. You also have a prescription for oxycodone, which should be taken if the tylenol (acetaminophen), ibuprofen, and robaxin (methocarbamol) are not enough to control your pain. You may take the oxycodone as frequently as every four hours as needed, but if you are taking the other medications as above, you should not need the oxycodone this frequently. You have also been given a prescription for colace (docusate) which is a stool softener. Please take this as prescribed because the oxycodone can cause constipation and the colace (docusate) will minimize or prevent constipation. Do not drive while taking or under the influence of the oxycodone as it is a narcotic medication.  Call the office at [phone] for temperature greater than 101.26F, worsening pain, redness or warmth at the incision site.  Please call [phone] to make an appointment for 2-3 weeks after surgery for wound check.

## 2022-06-29 ENCOUNTER — Encounter (HOSPITAL_COMMUNITY): Payer: Self-pay | Admitting: Surgery

## 2022-06-29 LAB — SURGICAL PATHOLOGY

## 2022-06-29 NOTE — Anesthesia Postprocedure Evaluation (Signed)
Anesthesia Post Note  Patient: Dorothy Boyd  Procedure(s) Performed: EXCISION OF SOFT TISSUE MASS INTERMAMMARY FOLD     Patient location during evaluation: PACU Anesthesia Type: General Level of consciousness: awake and alert Pain management: pain level controlled Vital Signs Assessment: post-procedure vital signs reviewed and stable Respiratory status: spontaneous breathing, nonlabored ventilation, respiratory function stable and patient connected to nasal cannula oxygen Cardiovascular status: blood pressure returned to baseline and stable Postop Assessment: no apparent nausea or vomiting Anesthetic complications: no   No notable events documented.  Last Vitals:  Vitals:   06/28/22 1215 06/28/22 1223  BP: (!) 126/59 120/60  Pulse: 86   Resp: 13   Temp:    SpO2: 97% 95%    Last Pain:  Vitals:   06/28/22 1223  TempSrc:   PainSc: 0-No pain                 Santa Lighter

## 2022-07-06 ENCOUNTER — Telehealth: Payer: Self-pay | Admitting: Cardiovascular Disease

## 2022-07-06 ENCOUNTER — Encounter: Payer: Self-pay | Admitting: Emergency Medicine

## 2022-07-06 NOTE — Telephone Encounter (Signed)
Called patient to tell her that I cancelled her Carotid test. No answer. Left message. Will send MyChart message

## 2022-07-06 NOTE — Telephone Encounter (Signed)
Patient wants to cancel Carotid test.

## 2022-07-07 ENCOUNTER — Ambulatory Visit (HOSPITAL_COMMUNITY): Admission: RE | Admit: 2022-07-07 | Payer: Medicare Other | Source: Ambulatory Visit

## 2022-07-14 ENCOUNTER — Encounter: Payer: Medicare Other | Admitting: Nurse Practitioner

## 2022-07-14 ENCOUNTER — Telehealth: Payer: Self-pay | Admitting: Nurse Practitioner

## 2022-07-14 NOTE — Telephone Encounter (Signed)
Pt wouldn't be clear to be as to what she wanted to talk to you about. She said at first that her Trulicity stopped coming and that she get it through the mail. I then asked her if she has called the pharmacy to see if the shipment is late she sad no bc she has no information for them.I asked her if she was on a patient assistance program and she said she didn't know. She then said "well I want to know if its not coming can I get something else?", I told her that any medication changes would require an in office appointment. She said she didn't want that, that she just wanted to talk to you about paper work. So it is unclear what she wants.

## 2022-07-15 NOTE — Telephone Encounter (Signed)
Spoke with patient. Advised her to call the company to ask what she needs to do next to re-apply for patient assistance.  Told her to call me if she needed any assistance.

## 2022-07-15 NOTE — Telephone Encounter (Signed)
LMOM to call back

## 2022-07-29 ENCOUNTER — Encounter: Payer: Medicare Other | Admitting: Nurse Practitioner

## 2022-08-19 ENCOUNTER — Encounter: Payer: Self-pay | Admitting: Nurse Practitioner

## 2022-08-19 ENCOUNTER — Ambulatory Visit (INDEPENDENT_AMBULATORY_CARE_PROVIDER_SITE_OTHER): Payer: Medicare Other | Admitting: Nurse Practitioner

## 2022-08-19 VITALS — BP 136/80 | HR 80 | Temp 97.7°F | Ht 63.0 in | Wt 164.2 lb

## 2022-08-19 DIAGNOSIS — I10 Essential (primary) hypertension: Secondary | ICD-10-CM

## 2022-08-19 DIAGNOSIS — I700 Atherosclerosis of aorta: Secondary | ICD-10-CM

## 2022-08-19 DIAGNOSIS — Z5320 Procedure and treatment not carried out because of patient's decision for unspecified reasons: Secondary | ICD-10-CM

## 2022-08-19 DIAGNOSIS — F419 Anxiety disorder, unspecified: Secondary | ICD-10-CM

## 2022-08-19 DIAGNOSIS — E1169 Type 2 diabetes mellitus with other specified complication: Secondary | ICD-10-CM

## 2022-08-19 DIAGNOSIS — I3481 Nonrheumatic mitral (valve) annulus calcification: Secondary | ICD-10-CM

## 2022-08-19 DIAGNOSIS — F17200 Nicotine dependence, unspecified, uncomplicated: Secondary | ICD-10-CM

## 2022-08-19 DIAGNOSIS — Z136 Encounter for screening for cardiovascular disorders: Secondary | ICD-10-CM | POA: Diagnosis not present

## 2022-08-19 DIAGNOSIS — F3341 Major depressive disorder, recurrent, in partial remission: Secondary | ICD-10-CM

## 2022-08-19 DIAGNOSIS — N182 Chronic kidney disease, stage 2 (mild): Secondary | ICD-10-CM

## 2022-08-19 DIAGNOSIS — Z0001 Encounter for general adult medical examination with abnormal findings: Secondary | ICD-10-CM

## 2022-08-19 DIAGNOSIS — I2510 Atherosclerotic heart disease of native coronary artery without angina pectoris: Secondary | ICD-10-CM

## 2022-08-19 DIAGNOSIS — Z1231 Encounter for screening mammogram for malignant neoplasm of breast: Secondary | ICD-10-CM

## 2022-08-19 DIAGNOSIS — Z91199 Patient's noncompliance with other medical treatment and regimen due to unspecified reason: Secondary | ICD-10-CM

## 2022-08-19 DIAGNOSIS — M25572 Pain in left ankle and joints of left foot: Secondary | ICD-10-CM

## 2022-08-19 DIAGNOSIS — D72829 Elevated white blood cell count, unspecified: Secondary | ICD-10-CM

## 2022-08-19 DIAGNOSIS — Z79899 Other long term (current) drug therapy: Secondary | ICD-10-CM

## 2022-08-19 DIAGNOSIS — E663 Overweight: Secondary | ICD-10-CM

## 2022-08-19 DIAGNOSIS — E1122 Type 2 diabetes mellitus with diabetic chronic kidney disease: Secondary | ICD-10-CM

## 2022-08-19 DIAGNOSIS — C189 Malignant neoplasm of colon, unspecified: Secondary | ICD-10-CM

## 2022-08-19 DIAGNOSIS — E559 Vitamin D deficiency, unspecified: Secondary | ICD-10-CM

## 2022-08-19 NOTE — Progress Notes (Signed)
FOLLOW UP  Assessment:   Dorothy Boyd was seen today for an annual physcial  Diagnoses and all orders for this visit:  CPE Due annually  Health maintenance reviewed  Aortic atherosclerosis ([name])/Hyperlipidemia Discussed lifestyle modifications. Recommended diet heavy in fruits and veggies, omega 3's. Decrease consumption of animal meats, cheeses, and dairy products. Remain active and exercise as tolerated. Continue to monitor. Check lipids/TSH  3 Vessel CAD Smoking cessation - patient not ready to quit. Denies angina Control blood pressure, cholesterol, glucose, increase exercise.  Recommend bASA, restart try every other day  Mitral valve annular calcificiation Denies concerning sx; stop smoking, control cholesterol and sugars Recommended cardiology evaluation  Malignant neoplasm of colon, unspecified part of colon ([place]) Hx of, s/p resection, has seen [name] Due for colonoscopy - plans to reach out to schedule  Type 2 diabetes mellitus with stage 1 chronic kidney disease, with long-term current use of insulin ([name])/Poorly Controlled Complicated by non-compliance, med intolerance Continue Dulaglutide/Trulicity Education: Reviewed 'ABCs' of diabetes management  Discussed goals to be met and/or maintained include A1C (<7) Blood pressure (<130/80) Cholesterol (LDL <70) Continue Eye Exam yearly  Continue Dental Exam Q6 mo Discussed dietary recommendations Discussed Physical Activity recommendations Foot exam UTD Check A1C  CKD stage 2 due to type 2 diabetes mellitus ([place]) Discussed how what you eat and drink can aide in kidney protection. Stay well hydrated. Avoid high salt foods. Avoid NSAIDS. Keep BP and BG well controlled.   Take medications as prescribed. Remain active and exercise as tolerated daily. Maintain weight.  Continue to monitor. Check CMP/GFR/Microablumin  Depression, major, recurrent, in partial remission ([name]) Celexa 20 mg daily,  seroquel Follow up sooner if SE or other concerns, continue seroquel Lifestyle discussed: diet/exerise, sleep hygiene, stress management, hydration  Vitamin D deficiency Continue daily supplement for goal of 60-100 Monitor vitamin d levels.   Smoker Discussed risks associated with tobacco use and advised to reduce or quit Patient is not ready to do so, had one completed 2022, but advised to consider strongly Lung cancer screening with low dose CT discussed as recommended by guidelines based on age, number of pack year history.  Discussed risks of screening including but not limited to false positives on xray, further testing or consultation with specialist, and possible false negative CT as well.   Overweight (BMI 25.0-29.9) Discussed appropriate BMI Diet modification. Physical activity. Encouraged/praised to build confidence.  Leukocytosis, unspecified type Has recently seen [name]; monitoring q39m  Check CBC with Differential/Platelet  Arthralgia of both feet Continue diclofenac gel PRN Discussed SCD's - Vascular may need to order  Anxiety Continue celexa Stress management techniques discussed, increase water, good sleep hygiene discussed, increase exercise, and increase veggies.   Poor compliance Continue to discuss barriers, encourage compliance  Osteoporosis screening Patient declines at this time  Medication management All medications discussed and reviewed in full. All questions and concerns regarding medications addressed.    Abscess of chest wall Resolved [facility] 06/03/22 for I&D of sebaceous cyst of left breast. Well healed  Screening for breast cancer Mammogram ordered  Orders Placed This Encounter  Procedures   CBC with Differential/Platelet   COMPLETE METABOLIC PANEL WITH GFR   Magnesium   Lipid panel   TSH   Hemoglobin A1c   Insulin, random   VITAMIN D 25 Hydroxy (Vit-D Deficiency, Fractures)   Urinalysis, Routine w reflex  microscopic   Microalbumin / creatinine urine ratio   PLATELET ESTIMATION   CBC MORPHOLOGY   EKG  12-Lead    Notify office for further evaluation and treatment, questions or concerns if any reported s/s fail to improve.   The patient was advised to call back or seek an in-person evaluation if any symptoms worsen or if the condition fails to improve as anticipated.   Further disposition pending results of labs. Discussed med's effects and SE's.    I discussed the assessment and treatment plan with the patient. The patient was provided an opportunity to ask questions and all were answered. The patient agreed with the plan and demonstrated an understanding of the instructions.  Discussed med's effects and SE's. Screening labs and tests as requested with regular follow-up as recommended.  I provided 35 minutes of face-to-face time during this encounter including counseling, chart review, and critical decision making was preformed.  Future Appointments  Date Time Provider [place]  05/18/2023  3:00 PM Darrol Jump, NP [facility] None  08/22/2023  3:00 PM Carlicia Leavens, Kenney Houseman, NP [facility] None     Plan:   During the course of the visit the patient was educated and counseled about appropriate screening and preventive services including:   Pneumococcal vaccine  Prevnar 13 Influenza vaccine Td vaccine Screening electrocardiogram Bone densitometry screening Colorectal cancer screening Diabetes screening Glaucoma screening Nutrition counseling  Advanced directives: requested   Subjective:  Dorothy Boyd is a 67 y.o. female who presents for annual CPE. She has Leukocytosis; Hyperlipidemia associated with type 2 diabetes mellitus ([place]); Diabetes ([place]); Vitamin D deficiency; Anxiety; Overweight (BMI 25.0-29.9); Arthralgia of both feet; Depression, major, recurrent, in partial remission ([place]); Tobacco abuse; CKD stage 2 due to type 2 diabetes mellitus ([place]); Aortic atherosclerosis  ([place]); Poor compliance; Mitral valve annular calcification; LAFB (left anterior fascicular block); 3-vessel CAD (per CT chest 11/2019); Constipation; Medication nonadherence due to intolerance; Calcification of abdominal aorta ([place]); Adrenal adenoma, left; History of colon cancer; Diabetic peripheral neuropathy associated with type 2 diabetes mellitus ([place]); B12 deficiency; and History of adenomatous polyp of colon on their problem list.  Overall she reports feeling well. She has no new concerns to reports in clinic today.   She is married, 2 children, 5 grandchildren. She is retired Medical sales representative work.   She does have intermittent pain in BLE and feet d/t PVD/DM.  Admits to wearing compression stockings.   She continues to smoke,  has 30+ year smoking history, since 1978. Denies cough, dyspnea. She had CT lung cancer screening 12/2019 without concerning nodule but suggestive of small airway disease.   She has hx of colon cancer in 2010, s/p resection and established with [name]; she has had unexplained leukocytosis and microcytosis documented since 2010.  Etiology remains unclear with possible myeloproliferative disorder and was referred back to Oncology for evaluation and was felt to be stable in 02/18/2021 by [name], was recommended monitoring and follow up only if trending up. She is recommended colonoscopy follow up for colon cancer screening, she went to [facility] earlier this year, reports had colonoscopy early 2022 but not full prep, did remove some polyps, was recommended 1 year follow up. She has colonoscopy scheduled for this year.   she has a diagnosis of depression and is currently on celexa 20 mg daily daily for mood and doing well. Also on seroquel and finds this helpful for sleep, failed trazodone and gabeptnin. She has been on xanax, takes 0.25 mg as needed for sleep a few days a week.   BMI is Body mass index is 29.09 kg/m., she has not been  working on diet and exercise. Wt  Readings from Last 3 Encounters:  08/19/22 164 lb 3.2 oz (74.5 kg)  06/28/22 162 lb (73.5 kg)  05/17/22 164 lb (74.4 kg)   Has aortic atherosclerosis and 3 vessel CAD per CT 11/2017, 11/2019.  CT chest showed dilated pulm arteries; underwent ECHO 03/06/2020 which showed normal pulmonary artery pressure, no atypical dilation, no hypertrophy. Did incidentally note mitral valve has moderate to severe calcification (hardening) - no regurgitation or noted stenosis. Also known 3 vessel CAD per CT chest. She was referred to cardiology early 2022 but had to postpone, receptive to referral back today.  She does have BP cuff, 120s/70s at home is BP: 136/80 She does workout. She denies chest pain, shortness of breath, dizziness.   She is on cholesterol medication (rosuvastatin 40 mg daily and fenofibrate) and denies myalgias. Her cholesterol is not at goal. The cholesterol last visit was:   Lab Results  Component Value Date   CHOL 223 (H) 05/17/2022   HDL 34 (L) 05/17/2022   LDLCALC 146 (H) 05/17/2022   TRIG 302 (H) 05/17/2022   CHOLHDL 6.6 (H) 05/17/2022   She has had diabetes for 10 + years.   She has been working on diet  Patient does NOT check glucose - needle phobia and adamantly declines, insulin Admittedly poorly compliant with lifestyle and medications due to intolerance, typically GI sx Continues Trulicity Last [id number] in the office was:  Lab Results  Component Value Date   HGBA1C 7.9 (H) 05/17/2022   She has CKD I/II associated with T2DM monitored at this office:  Lab Results  Component Value Date   GFRNONAA >60 06/28/2022   Patient is on Vitamin D supplement, 5000 IU gummies, when she remembers  Lab Results  Component Value Date   VD25OH 23 (L) 05/17/2022      Medication Review: Current Outpatient Medications on File Prior to Visit  Medication Sig Dispense Refill   acetaminophen (TYLENOL) 500 MG tablet Take 2 tablets (1,000 mg total) by mouth every 6 (six) hours. 120 tablet 3    ALPRAZolam (XANAX) 0.5 MG tablet TAKE 1/2 TO 1 (ONE-HALF TO ONE) TABLET BY MOUTH AT BEDTIME AS NEEDED FOR  SLEEP OR ANXIETY (Patient taking differently: Take 0.25 mg by mouth at bedtime as needed for anxiety or sleep.) 30 tablet 0   Cholecalciferol (DIALYVITE VITAMIN D 5000) 125 MCG (5000 UT) capsule Take 5,000 Units by mouth once a week.     citalopram (CELEXA) 20 MG tablet Take  1 tablet  Daily for Mood                                                              /                            TAKE                           BY                        MOUTH  ONCE DAILY 90 tablet 3   docusate sodium (COLACE) 100 MG capsule Take 1 capsule (100 mg total) by mouth 2 (two) times daily. 60 capsule 2   Dulaglutide (TRULICITY) A999333 0000000 SOPN Inject 0.75 mg into the skin once a week. 2 mL 0   glimepiride (AMARYL) 4 MG tablet Take 1 tablet (4 mg total) by mouth 2 (two) times daily with a meal. (Patient taking differently: Take 4 mg by mouth at bedtime.) 180 tablet 0   ibuprofen (ADVIL) 600 MG tablet Take 1 tablet (600 mg total) by mouth 4 (four) times daily. 120 tablet 1   ondansetron (ZOFRAN) 4 MG tablet Take 1 tablet (4 mg total) by mouth every 8 (eight) hours as needed for nausea or vomiting. 60 tablet 1   promethazine (PHENERGAN) 25 MG tablet Take 1 tablet (25 mg total) by mouth every 6 (six) hours as needed for nausea or vomiting. 30 tablet 3   QUEtiapine (SEROQUEL) 25 MG tablet Take  1 tablet  at Bedtime  as needed  for Sleep                                                                    /                                               TAKE                                             BY                                      MOUTH (Patient taking differently: Take 25 mg by mouth at bedtime.) 90 tablet 0   methocarbamol (ROBAXIN-750) 750 MG tablet Take 1 tablet (750 mg total) by mouth 4 (four) times daily. (Patient not taking: Reported on 08/19/2022) 120 tablet 1   oxyCODONE  (ROXICODONE) 5 MG immediate release tablet Take 1 tablet (5 mg total) by mouth every 4 (four) hours as needed for severe pain. (Patient not taking: Reported on 08/19/2022) 10 tablet 0   rosuvastatin (CRESTOR) 40 MG tablet Take 40 mg by mouth every 3 (three) days. (Patient not taking: Reported on 08/19/2022)     No current facility-administered medications on file prior to visit.    Allergies  Allergen Reactions   Codeine     Unknown reaction    Glipizide Nausea And Vomiting   Lunesta [Eszopiclone]     Excessive fatigue   Metformin And Related     Nausea and abdominal pain   Welchol [Colesevelam Hcl] Nausea Only     Current Problems (verified) Patient Active Problem List   Diagnosis Date Noted   History of adenomatous polyp of colon 09/18/2021   B12 deficiency 07/15/2021   Diabetic peripheral neuropathy associated with type 2 diabetes mellitus ([place]) 07/14/2021   History of colon cancer 07/10/2021   Calcification of abdominal aorta ([place])  07/02/2020   Adrenal adenoma, left 07/02/2020   Medication nonadherence due to intolerance 06/25/2020   LAFB (left anterior fascicular block) 06/24/2020   3-vessel CAD (per CT chest 11/2019) 06/24/2020   Constipation 06/24/2020   Mitral valve annular calcification 03/07/2020   Poor compliance 12/07/2019   Aortic atherosclerosis ([place]) 08/01/2019   CKD stage 2 due to type 2 diabetes mellitus ([place]) 07/20/2019   Tobacco abuse 04/16/2019   Depression, major, recurrent, in partial remission ([place]) 01/10/2019   Arthralgia of both feet 10/04/2018   Overweight (BMI 25.0-29.9) 09/29/2017   Hyperlipidemia associated with type 2 diabetes mellitus ([place])    Diabetes ([employer])    Vitamin D deficiency    Anxiety    Leukocytosis 09/18/2012    Screening Tests Immunization History  Administered Date(s) Administered   Pneumococcal Polysaccharide-23 01/07/2020   Preventative care: Last colonoscopy: 2010 Dr. Earlean Shawl, overdue, hx of colon cancer, patient [facility]  [facility] 2022 - 2023 - [facility] Last mammogram: 05/2021 - ordered Last pap smear/pelvic exam: 05/2013, hx of abnormal but had normal x 2 last checks, declines further DEXA: ordered but patient declines to schedule at this time   Prior vaccinations: TD or Tdap: declines   Influenza: declines Pneumococcal: 12/2019 Prevnar13: declines Shingles/Zostavax: declines  Covid 19: declines   Names of Other Physician/Practitioners you currently use: 1. [facility] here for primary care 2. [facility], eye doctor, last visit 08/2020, reminded to schedule this year 3. [facility], [employer], Pharmacist, community, last visit 2020, declines follow up, has full dentures  Patient Care Team: Unk Pinto, MD as PCP - General (Internal Medicine) Wyatt Portela, MD as Consulting Physician (Oncology)  SURGICAL HISTORY She  has a past surgical history that includes Appendectomy; Partial colectomy (2010); Laparoscopic cholecystectomy (2010); Colonoscopy; Breast biopsy (Left, 06/10/2016); and Mass excision (N/A, 06/28/2022). FAMILY HISTORY Her family history includes Cancer - Ovarian in her mother; Diabetes in her paternal grandmother and sister; Gastric cancer in her paternal aunt; Heart disease in her brother; Hyperlipidemia in her mother and sister; Hypertension in her mother. SOCIAL HISTORY She  reports that she has been smoking cigarettes. She started smoking about 46 years ago. She has a 22.00 pack-year smoking history. She has never used smokeless tobacco. She reports that she does not drink alcohol and does not use drugs.  Depression/mood screen:      07/14/2021    3:39 PM  Depression screen PHQ 2/9  Decreased Interest 0  Down, Depressed, Hopeless 0  PHQ - 2 Score 0     Review of Systems  Constitutional:  Negative for malaise/fatigue and weight loss.  HENT:  Negative for hearing loss and tinnitus.   Eyes:  Negative for blurred vision and double vision.   Respiratory:  Negative for cough, sputum production, shortness of breath and wheezing.   Cardiovascular:  Negative for chest pain, palpitations, orthopnea, claudication, leg swelling and PND.  Gastrointestinal:  Positive for constipation (chronic). Negative for abdominal pain, blood in stool, diarrhea, heartburn, melena, nausea and vomiting.  Genitourinary: Negative.   Musculoskeletal:  Negative for falls (1 fall in 12 months ), joint pain (bil feet, improved) and myalgias.  Skin:  Negative for rash.  Neurological:  Negative for dizziness, tingling, sensory change, weakness and headaches.  Endo/Heme/Allergies:  Negative for polydipsia.  Psychiatric/Behavioral:  Negative for depression, memory loss, substance abuse and suicidal ideas. The patient is not nervous/anxious and does not have insomnia.   All other systems reviewed and are negative.  Objective:     Today's Vitals   08/19/22 1505  BP: 136/80  Pulse: 80  Temp: 97.7 F (36.5 C)  SpO2: 98%  Weight: 164 lb 3.2 oz (74.5 kg)  Height: 5\' 3"  (1.6 m)   Body mass index is 29.09 kg/m.  General appearance: alert, no distress, WD/WN, female HEENT: normocephalic, sclerae anicteric, TMs pearly, nares patent, no discharge or erythema, pharynx normal Oral cavity: MMM, no lesions Neck: supple, no lymphadenopathy, no thyromegaly, no masses Heart: RRR, normal S1, S2, no murmurs  Lungs: CTA bilaterally, no wheezes, rhonchi, or rales Abdomen: +bs, firm, mildly distended, generalized tenderness with light palpation, no distinct palpable masses or organomegaly; tenderness limiting exam Musculoskeletal: nontender, no swelling, no obvious deformity Extremities: no edema, no cyanosis, no clubbing Pulses: 2+ symmetric, upper and lower extremities, normal cap refill Neurological: alert, oriented x 3, CN2-12 intact, strength normal upper extremities and lower extremities, sensation normal throughout, DTRs 2+ throughout, no cerebellar signs, gait  slow steady Psychiatric: normal affect, behavior normal, pleasant   EKG:  No new changes - NSR   Bronislaw Switzer, NP   08/19/2022

## 2022-08-20 LAB — LIPID PANEL
Cholesterol: 225 mg/dL — ABNORMAL HIGH (ref <200)
HDL: 35 mg/dL — ABNORMAL LOW (ref >=50)
LDL Cholesterol (Calc): 144 mg/dL (calc) — ABNORMAL HIGH
Non-HDL Cholesterol (Calc): 190 mg/dL (calc) — ABNORMAL HIGH (ref <130)
Total CHOL/HDL Ratio: 6.4 (calc) — ABNORMAL HIGH (ref <5.0)
Triglycerides: 278 mg/dL — ABNORMAL HIGH (ref <150)

## 2022-08-20 LAB — CBC WITH DIFFERENTIAL/PLATELET
Absolute Monocytes: 676 cells/uL (ref 200–950)
Basophils Absolute: 113 cells/uL (ref 0–200)
Basophils Relative: 0.7 %
Eosinophils Absolute: 354 cells/uL (ref 15–500)
Eosinophils Relative: 2.2 %
HCT: 39.2 % (ref 35.0–45.0)
Hemoglobin: 12.2 g/dL (ref 11.7–15.5)
Lymphs Abs: 6038 cells/uL — ABNORMAL HIGH (ref 850–3900)
MCH: 19 pg — ABNORMAL LOW (ref 27.0–33.0)
MCHC: 31.1 g/dL — ABNORMAL LOW (ref 32.0–36.0)
MCV: 61 fL — ABNORMAL LOW (ref 80.0–100.0)
MPV: 9.7 fL (ref 7.5–12.5)
Monocytes Relative: 4.2 %
Neutro Abs: 8919 cells/uL — ABNORMAL HIGH (ref 1500–7800)
Neutrophils Relative %: 55.4 %
Platelets: 416 10*3/uL — ABNORMAL HIGH (ref 140–400)
RBC: 6.43 10*6/uL — ABNORMAL HIGH (ref 3.80–5.10)
RDW: 18 % — ABNORMAL HIGH (ref 11.0–15.0)
Total Lymphocyte: 37.5 %
WBC: 16.1 10*3/uL — ABNORMAL HIGH (ref 3.8–10.8)

## 2022-08-20 LAB — COMPLETE METABOLIC PANEL WITH GFR
AG Ratio: 1.6 (calc) (ref 1.0–2.5)
ALT: 12 U/L (ref 6–29)
AST: 13 U/L (ref 10–35)
Albumin: 4.4 g/dL (ref 3.6–5.1)
Alkaline phosphatase (APISO): 92 U/L (ref 37–153)
BUN: 16 mg/dL (ref 7–25)
CO2: 25 mmol/L (ref 20–32)
Calcium: 9.9 mg/dL (ref 8.6–10.4)
Chloride: 103 mmol/L (ref 98–110)
Creat: 0.81 mg/dL (ref 0.50–1.05)
Globulin: 2.7 g/dL (calc) (ref 1.9–3.7)
Glucose, Bld: 68 mg/dL (ref 65–99)
Potassium: 4.3 mmol/L (ref 3.5–5.3)
Sodium: 138 mmol/L (ref 135–146)
Total Bilirubin: 0.6 mg/dL (ref 0.2–1.2)
Total Protein: 7.1 g/dL (ref 6.1–8.1)
eGFR: 79 mL/min/{1.73_m2} (ref >=60)

## 2022-08-20 LAB — VITAMIN D 25 HYDROXY (VIT D DEFICIENCY, FRACTURES): Vit D, 25-Hydroxy: 38 ng/mL (ref 30–100)

## 2022-08-20 LAB — CBC MORPHOLOGY

## 2022-08-20 LAB — URINALYSIS, ROUTINE W REFLEX MICROSCOPIC
Bilirubin Urine: NEGATIVE
Glucose, UA: NEGATIVE
Hgb urine dipstick: NEGATIVE
Ketones, ur: NEGATIVE
Leukocytes,Ua: NEGATIVE
Nitrite: NEGATIVE
Protein, ur: NEGATIVE
Specific Gravity, Urine: 1.008 (ref 1.001–1.035)
pH: 7 (ref 5.0–8.0)

## 2022-08-20 LAB — MAGNESIUM: Magnesium: 2 mg/dL (ref 1.5–2.5)

## 2022-08-20 LAB — HEMOGLOBIN A1C
Hgb A1c MFr Bld: 8.1 % of total Hgb — ABNORMAL HIGH (ref <5.7)
Mean Plasma Glucose: 186 mg/dL
eAG (mmol/L): 10.3 mmol/L

## 2022-08-20 LAB — MICROALBUMIN / CREATININE URINE RATIO
Creatinine, Urine: 43 mg/dL (ref 20–275)
Microalb Creat Ratio: 12 mg/g creat (ref <30)
Microalb, Ur: 0.5 mg/dL

## 2022-08-20 LAB — TSH: TSH: 2.99 mIU/L (ref 0.40–4.50)

## 2022-08-20 LAB — INSULIN, RANDOM: Insulin: 20.9 u[IU]/mL — ABNORMAL HIGH

## 2022-08-23 ENCOUNTER — Encounter: Payer: Self-pay | Admitting: Nurse Practitioner

## 2022-08-23 MED ORDER — TRULICITY 0.75 MG/0.5ML ~~LOC~~ SOAJ: 0.7500 mg | SUBCUTANEOUS | 0 refills | Status: DC

## 2022-08-23 NOTE — Patient Instructions (Signed)

## 2022-08-31 ENCOUNTER — Telehealth: Payer: Self-pay | Admitting: Pharmacist

## 2022-08-31 DIAGNOSIS — E1122 Type 2 diabetes mellitus with diabetic chronic kidney disease: Secondary | ICD-10-CM

## 2022-08-31 NOTE — Telephone Encounter (Signed)
Spoke with pt re: Trulicity cost concerns- pt states she was receiving medication prior year (2023) via [facility] patient assistance but has not renewed for 2024. Instructed pt on re-application process and required documentation. Pt to bring or mail completed application to office for provider portion completion.

## 2022-09-02 ENCOUNTER — Other Ambulatory Visit: Payer: Self-pay | Admitting: Nurse Practitioner

## 2022-09-02 DIAGNOSIS — G4700 Insomnia, unspecified: Secondary | ICD-10-CM

## 2022-09-27 ENCOUNTER — Encounter: Payer: Self-pay | Admitting: Nurse Practitioner

## 2022-10-05 ENCOUNTER — Encounter: Payer: Self-pay | Admitting: Nurse Practitioner

## 2022-10-07 ENCOUNTER — Other Ambulatory Visit: Payer: Self-pay

## 2022-10-07 DIAGNOSIS — E1169 Type 2 diabetes mellitus with other specified complication: Secondary | ICD-10-CM

## 2022-10-07 DIAGNOSIS — E1122 Type 2 diabetes mellitus with diabetic chronic kidney disease: Secondary | ICD-10-CM

## 2022-10-07 DIAGNOSIS — I2510 Atherosclerotic heart disease of native coronary artery without angina pectoris: Secondary | ICD-10-CM

## 2022-10-07 DIAGNOSIS — Z794 Long term (current) use of insulin: Secondary | ICD-10-CM

## 2022-10-07 MED ORDER — TRULICITY 0.75 MG/0.5ML ~~LOC~~ SOAJ: 0.7500 mg | SUBCUTANEOUS | 0 refills | Status: DC

## 2022-10-20 ENCOUNTER — Encounter: Payer: Self-pay | Admitting: Nurse Practitioner

## 2022-10-21 ENCOUNTER — Other Ambulatory Visit: Payer: Self-pay | Admitting: Nurse Practitioner

## 2022-11-04 ENCOUNTER — Encounter: Payer: Self-pay | Admitting: Nurse Practitioner

## 2022-11-17 ENCOUNTER — Other Ambulatory Visit: Payer: Self-pay | Admitting: Internal Medicine

## 2022-11-17 ENCOUNTER — Other Ambulatory Visit: Payer: Self-pay | Admitting: Nurse Practitioner

## 2022-11-17 DIAGNOSIS — F3341 Major depressive disorder, recurrent, in partial remission: Secondary | ICD-10-CM

## 2022-11-17 DIAGNOSIS — G4700 Insomnia, unspecified: Secondary | ICD-10-CM

## 2022-11-21 ENCOUNTER — Encounter: Payer: Self-pay | Admitting: Internal Medicine

## 2022-11-21 NOTE — Patient Instructions (Signed)

## 2022-11-21 NOTE — Progress Notes (Addendum)
Future Appointments  Date Time Provider Department  11/22/2022  3:30 PM Lucky Cowboy, MD [facility]  05/18/2023                wellness  3:00 PM Adela Glimpse, NP [facility]  08/22/2023                   cpe  3:00 PM Adela Glimpse, NP [facility]    History of Present Illness:       This very nice but poorly compliant   67 y.o. MWF presents for 6 month follow up with HTN, HLD, T2_NIDDM and Vitamin D Deficiency. In 2010, patient had a sigmoid Colon Cancer resected and underwent ChemoTx  by Dr Myna Hidalgo and is on active surveillance.  Patient has Aortic Atherosclerosis by  CT scan in 2019.                                                      Review of patient's CBC's for several years finds elevated RBC's, WBC's & platelets suspect for a myeloproliferative disorder as PRV and is monitored by Dr Clelia Croft.  She had a bone marrow done in 2005 - ->>18 years ago for similar concerns.        Patient is monitored expectantly for elevated BP & BP has been controlled at home. Today's BP is at goal -   136/70.  Patient has had no complaints of any cardiac type chest pain, palpitations, dyspnea / orthopnea / PND, dizziness, claudication, or dependent edema.        Hyperlipidemia is not controlled with diet & alleged statin intolerance and poor compliance  with her meds. Patient denies myalgias or other med SE's.  Patient does not take her prescribed Rosuvastatin and Last Lipids were not at goal:  Lab Results  Component Value Date   CHOL 225 (H) 08/19/2022   HDL 35 (L) 08/19/2022   LDLCALC 144 (H) 08/19/2022   TRIG 278 (H) 08/19/2022   CHOLHDL 6.4 (H) 08/19/2022     Also, the patient has history of poorly controlled T2_NIDDM  (Jan 2012) w/CKD2 ( GFR 79 ). Patient is overweight with a BMI 29+ and has been poorly compliant with diet and recommended therapies over the years.   Patient is on Glimepiride & also on LOW DOSE Trulicity 0.75 mg for the last year w/o significant weight loss (  receives Trulicity on patient assistance ) . Patient has had no symptoms of reactive hypoglycemia, diabetic polys, paresthesias or visual blurring.   Patient relates prior hx/o GI intolerance to immediate release Metformin in the past , but today indicates willingness to try the slow release form of Metformin.  Last A1c was not at goal:   Lab Results  Component Value Date   HGBA1C 8.1 (H) 08/19/2022   Wt Readings from Last 3 Encounters:  11/22/22 163 lb 6.4 oz (74.1 kg)  08/19/22 164 lb 3.2 oz (74.5 kg)  06/28/22 162 lb (73.5 kg)                                                     Further, the patient also has history of Vitamin D Deficiency  ("  33" /2013) and apparently does not supplement vitamin D as previously recommended.  Last vitamin D was still not at goal:  Lab Results  Component Value Date   VD25OH 38 08/19/2022       Current Outpatient Medications  Medication Instructions   acetaminophen (TYLENOL) 1,000 mg, Oral, Every 6 hours   ALPRAZolam (XANAX) 0.5 MG tablet TAKE 1/2 TO 1  TABLET AT BEDTIME   citalopram (CELEXA) 20 MG tablet Take  1 tablet  Daily    Dialyvite Vitamin D 5000 5,000 Units Weekly   docusate sodium (COLACE) 100 mg, Oral, 2 times daily   glimepiride (AMARYL) 4 MG tablet Take  1 tablet   2 x /day  with Meals for Diabetes   ibuprofen (ADVIL) 600 mg, Oral, 4 times daily   ondansetron (ZOFRAN) 4 mg, Oral, Every 8 hours PRN   promethazine (PHENERGAN) 25 mg, Oral, Every 6 hours PRN   QUEtiapine (SEROQUEL) 25 MG tablet TAKE 1 TABLET BY MOUTH AT BEDTIME AS NEEDED FOR SLEEP    Trulicity 3 mg, Injection, Weekly     Allergies  Allergen Reactions   Codeine    Glipizide Nausea And Vomiting   Lunesta [Eszopiclone]     Excessive fatigue   Metformin And Related     Nausea and abdominal pain   Welchol [Colesevelam Hcl]     nausea    PMHx:   Past Medical History:  Diagnosis Date   Anxiety    Cervical dysplasia    Colon cancer ([name])    Depression     Diabetes ([name])    History of colon cancer, stage II 09/18/2012   Hyperlipidemia    Right-sided Bell's palsy 01/03/2019   Vitamin D deficiency      Immunization History  Administered Date(s) Administered   Pneumococcal Polysaccharide-23 01/07/2020    Past Surgical History:  Procedure Laterality Date   APPENDECTOMY     COLONOSCOPY     LAPAROSCOPIC CHOLECYSTECTOMY  2010   PARTIAL COLECTOMY  2010   Colon cancer resection, Dr. Zachery Dakins    FHx:    Reviewed / unchanged  SHx:    Reviewed / unchanged   Systems Review:  Constitutional: Denies fever, chills, wt changes, headaches, insomnia, fatigue, night sweats, change in appetite. Eyes: Denies redness, blurred vision, diplopia, discharge, itchy, watery eyes.  ENT: Denies discharge, congestion, post nasal drip, epistaxis, sore throat, earache, hearing loss, dental pain, tinnitus, vertigo, sinus pain, snoring.  CV: Denies chest pain, palpitations, irregular heartbeat, syncope, dyspnea, diaphoresis, orthopnea, PND, claudication or edema. Respiratory: denies cough, dyspnea, DOE, pleurisy, hoarseness, laryngitis, wheezing.  Gastrointestinal: Denies dysphagia, odynophagia, heartburn, reflux, water brash, abdominal pain or cramps, nausea, vomiting, bloating, diarrhea, constipation, hematemesis, melena, hematochezia  or hemorrhoids. Genitourinary: Denies dysuria, frequency, urgency, nocturia, hesitancy, discharge, hematuria or flank pain. Musculoskeletal: Denies arthralgias, myalgias, stiffness, jt. swelling, pain, limping or strain/sprain.  Skin: Denies pruritus, rash, hives, warts, acne, eczema or change in skin lesion(s). Neuro: No weakness, tremor, incoordination, spasms, paresthesia or pain. Psychiatric: Denies confusion, memory loss or sensory loss. Endo: Denies change in weight, skin or hair change.  Heme/Lymph: No excessive bleeding, bruising or enlarged lymph nodes.  Physical Exam  BP 136/70   Pulse 88   Temp 97.9 F (36.6 C)    Resp 16   Ht 5\' 3"  (1.6 m)   Wt 163 lb 6.4 oz (74.1 kg)   SpO2 96%   BMI 28.95 kg/m   Appears  over nourished and in no distress.  Eyes: PERRLA,  EOMs, conjunctiva no swelling or erythema. Sinuses: No frontal/maxillary tenderness ENT/Mouth: EAC's clear, TM's nl w/o erythema, bulging. Nares clear w/o erythema, swelling, exudates. Oropharynx clear without erythema or exudates. Oral hygiene is good. Tongue normal, non obstructing. Hearing intact.  Neck: Supple. Thyroid not palpable. Car 2+/2+ without bruits, nodes or JVD. Chest: Respirations nl with BS clear & equal w/o rales, rhonchi, wheezing or stridor.  Cor: Heart sounds normal w/ regular rate and rhythm without sig. murmurs, gallops, clicks or rubs. Peripheral pulses normal and equal  without edema.  Abdomen: Soft & bowel sounds normal. Non-tender w/o guarding, rebound, hernias, masses or organomegaly.  Lymphatics: Unremarkable.  Musculoskeletal: Full ROM all peripheral extremities, joint stability, 5/5 strength and normal gait.  Skin: Warm, dry without exposed rashes, lesions or ecchymosis apparent.  Neuro: Cranial nerves intact, reflexes equal bilaterally. Sensory-motor testing grossly intact. Tendon reflexes grossly intact.  Pysch: Alert & oriented x 3.  Insight and judgement nl & appropriate. No ideations.  Assessment and Plan:   1. Labile hypertension  - Continue medication, monitor blood pressure at home.  - Continue DASH diet.  Reminder to go to the ER if any CP,  SOB, nausea, dizziness, severe HA, changes vision/speech.    - CBC with Differential/Platelet - COMPLETE METABOLIC PANEL WITH GFR - Magnesium - TSH    2. Overweight (BMI 25.0-29.9)  - TSH   3. Hyperlipidemia associated with type 2 diabetes mellitus ([name])  - Continue diet/meds, exercise,& lifestyle modifications.  - Continue monitor periodic cholesterol/liver & renal functions    - Lipid panel - TSH   4. Type 2 diabetes mellitus with stage 2 chronic  kidney                                             disease, without  long-term current use of insulin ([name])    - Continue diet, exercise  - Lifestyle modifications.  - Monitor appropriate labs     - Retry slow release Metformin   - Recommend increase Trulicity to 3.0 mg q7days.     ( Get approval via patient assistance)   - Hemoglobin A1c - Insulin, random   5. Vitamin D deficiency  - Continue supplementation     - VITAMIN D 25 Hydroxy    6. Aortic atherosclerosis ([name])  - Lipid panel  7. Myeloproliferative disorder ([name])  - CBC with Differential/Platelet   8. Medication management  - CBC with Differential/Platelet - COMPLETE METABOLIC PANEL WITH GFR - Magnesium - Lipid panel - TSH - Hemoglobin A1c - Insulin, random - VITAMIN D 25 Hydroxy          Discussed  regular exercise, BP monitoring, weight control to achieve/maintain BMI less than 25 and discussed med and SE's. Recommended labs to assess and monitor clinical status with further disposition pending results of labs.  I discussed the assessment and treatment plan with the patient. The patient was provided an opportunity to ask questions and all were answered. The patient agreed with the plan and demonstrated an understanding of the instructions.  I provided over 30 minutes of exam, counseling, chart review and  complex critical decision making.        The patient was advised to call back or seek an in-person evaluation if the symptoms worsen or if the condition fails to improve as anticipated.   Marinus Maw, MD

## 2022-11-22 ENCOUNTER — Encounter: Payer: Self-pay | Admitting: Internal Medicine

## 2022-11-22 ENCOUNTER — Ambulatory Visit (INDEPENDENT_AMBULATORY_CARE_PROVIDER_SITE_OTHER): Payer: Medicare Other | Admitting: Internal Medicine

## 2022-11-22 VITALS — BP 136/70 | HR 88 | Temp 97.9°F | Resp 16 | Ht 63.0 in | Wt 163.4 lb

## 2022-11-22 DIAGNOSIS — R0989 Other specified symptoms and signs involving the circulatory and respiratory systems: Secondary | ICD-10-CM

## 2022-11-22 DIAGNOSIS — N182 Chronic kidney disease, stage 2 (mild): Secondary | ICD-10-CM

## 2022-11-22 DIAGNOSIS — E559 Vitamin D deficiency, unspecified: Secondary | ICD-10-CM

## 2022-11-22 DIAGNOSIS — E0822 Diabetes mellitus due to underlying condition with diabetic chronic kidney disease: Secondary | ICD-10-CM

## 2022-11-22 DIAGNOSIS — E1169 Type 2 diabetes mellitus with other specified complication: Secondary | ICD-10-CM

## 2022-11-22 DIAGNOSIS — E1122 Type 2 diabetes mellitus with diabetic chronic kidney disease: Secondary | ICD-10-CM

## 2022-11-22 DIAGNOSIS — E663 Overweight: Secondary | ICD-10-CM | POA: Diagnosis not present

## 2022-11-22 DIAGNOSIS — E785 Hyperlipidemia, unspecified: Secondary | ICD-10-CM

## 2022-11-22 DIAGNOSIS — I700 Atherosclerosis of aorta: Secondary | ICD-10-CM

## 2022-11-22 DIAGNOSIS — Z79899 Other long term (current) drug therapy: Secondary | ICD-10-CM

## 2022-11-22 DIAGNOSIS — D471 Chronic myeloproliferative disease: Secondary | ICD-10-CM

## 2022-11-22 MED ORDER — TRULICITY 3 MG/0.5ML ~~LOC~~ SOAJ: 3.0000 mg | SUBCUTANEOUS | 2 refills | Status: DC

## 2022-11-22 MED ORDER — METFORMIN HCL ER 500 MG PO TB24: ORAL_TABLET | ORAL | 3 refills | Status: AC

## 2022-11-22 NOTE — Addendum Note (Signed)
Addended by: Lucky Cowboy on: 11/22/2022 09:50 PM   Modules accepted: Orders

## 2022-11-23 LAB — CBC WITH DIFFERENTIAL/PLATELET
Absolute Monocytes: 534 cells/uL (ref 200–950)
Basophils Absolute: 137 cells/uL (ref 0–200)
Basophils Relative: 1 %
Eosinophils Absolute: 315 cells/uL (ref 15–500)
Eosinophils Relative: 2.3 %
HCT: 38.9 % (ref 35.0–45.0)
Hemoglobin: 12.1 g/dL (ref 11.7–15.5)
Lymphs Abs: 5001 cells/uL — ABNORMAL HIGH (ref 850–3900)
MCH: 19.1 pg — ABNORMAL LOW (ref 27.0–33.0)
MCHC: 31.1 g/dL — ABNORMAL LOW (ref 32.0–36.0)
MCV: 61.5 fL — ABNORMAL LOW (ref 80.0–100.0)
MPV: 9.8 fL (ref 7.5–12.5)
Monocytes Relative: 3.9 %
Neutro Abs: 7713 cells/uL (ref 1500–7800)
Neutrophils Relative %: 56.3 %
Platelets: 434 10*3/uL — ABNORMAL HIGH (ref 140–400)
RBC: 6.33 10*6/uL — ABNORMAL HIGH (ref 3.80–5.10)
RDW: 17.3 % — ABNORMAL HIGH (ref 11.0–15.0)
Total Lymphocyte: 36.5 %
WBC: 13.7 10*3/uL — ABNORMAL HIGH (ref 3.8–10.8)

## 2022-11-23 LAB — HEMOGLOBIN A1C
Hgb A1c MFr Bld: 7.6 % of total Hgb — ABNORMAL HIGH (ref <5.7)
Mean Plasma Glucose: 171 mg/dL
eAG (mmol/L): 9.5 mmol/L

## 2022-11-23 LAB — COMPLETE METABOLIC PANEL WITH GFR
AG Ratio: 1.8 (calc) (ref 1.0–2.5)
ALT: 10 U/L (ref 6–29)
AST: 13 U/L (ref 10–35)
Albumin: 4.6 g/dL (ref 3.6–5.1)
Alkaline phosphatase (APISO): 86 U/L (ref 37–153)
BUN: 14 mg/dL (ref 7–25)
CO2: 26 mmol/L (ref 20–32)
Calcium: 10 mg/dL (ref 8.6–10.4)
Chloride: 101 mmol/L (ref 98–110)
Creat: 0.86 mg/dL (ref 0.50–1.05)
Globulin: 2.6 g/dL (calc) (ref 1.9–3.7)
Glucose, Bld: 120 mg/dL — ABNORMAL HIGH (ref 65–99)
Potassium: 4.7 mmol/L (ref 3.5–5.3)
Sodium: 137 mmol/L (ref 135–146)
Total Bilirubin: 0.6 mg/dL (ref 0.2–1.2)
Total Protein: 7.2 g/dL (ref 6.1–8.1)
eGFR: 74 mL/min/{1.73_m2} (ref >=60)

## 2022-11-23 LAB — TSH: TSH: 3.72 mIU/L (ref 0.40–4.50)

## 2022-11-23 LAB — LIPID PANEL
Cholesterol: 218 mg/dL — ABNORMAL HIGH (ref <200)
HDL: 42 mg/dL — ABNORMAL LOW (ref >=50)
LDL Cholesterol (Calc): 136 mg/dL (calc) — ABNORMAL HIGH
Non-HDL Cholesterol (Calc): 176 mg/dL (calc) — ABNORMAL HIGH (ref <130)
Total CHOL/HDL Ratio: 5.2 (calc) — ABNORMAL HIGH (ref <5.0)
Triglycerides: 257 mg/dL — ABNORMAL HIGH (ref <150)

## 2022-11-23 LAB — MAGNESIUM: Magnesium: 2.1 mg/dL (ref 1.5–2.5)

## 2022-11-23 LAB — CBC MORPHOLOGY

## 2022-11-23 LAB — INSULIN, RANDOM: Insulin: 27.6 u[IU]/mL — ABNORMAL HIGH

## 2022-11-23 LAB — VITAMIN D 25 HYDROXY (VIT D DEFICIENCY, FRACTURES): Vit D, 25-Hydroxy: 47 ng/mL (ref 30–100)

## 2022-11-23 NOTE — Progress Notes (Signed)
^<^<^<^<^<^<^<^<^<^<^<^<^<^<^<^<^<^<^<^<^<^<^<^<^<^<^<^<^<^<^<^<^<^<^<^<^ ^>^>^>^>^>^>^>^>^>^>^>>^>^>^>^>^>^>^>^>^>^>^>^>^>^>^>^>^>^>^>^>^>^>^>^>^>  -Test results slightly outside the reference range are not unusual. If there is anything important, I will review this with you,  otherwise it is considered normal test values.  If you have further questions,  please do not hesitate to contact me at the office or via My Chart.   ^<^<^<^<^<^<^<^<^<^<^<^<^<^<^<^<^<^<^<^<^<^<^<^<^<^<^<^<^<^<^<^<^<^<^<^<^ ^>^>^>^>^>^>^>^>^>^>^>^>^>^>^>^>^>^>^>^>^>^>^>^>^>^>^>^>^>^>^>^>^>^>^>^>^  -   CBC shows Hgb /Red cell count - Normal  & WBC is slightly elevated as it has been ^>^>^>^>^>^>^>^>^>^>^>^>^>^>^>^>^>^>^>^>^>^>^>^>^>^>^>^>^>^>^>^>^>^>^>^>^  -  Total  Chol =     218    - Elevated             (  Ideal  or  Goal is less than 180  !  )  & -  Bad / Dangerous LDL  Chol =  136   - also Elevated              (  Ideal  or  Goal is less than 70  !  )   - Recc much stricter diet   - Cholesterol only comes from animal sources                                                                                     - ie. meat, dairy, egg yolks - Eat all the vegetables you want.  - Avoid Meat, Avoid Meat,  Avoid Meat                                                                              - especially Red Meat - Beef AND Pork .  - Avoid cheese & dairy - milk & ice cream.     - Cheese is the most concentrated form of trans-fats which                                                                              is the worst thing to clog up our arteries.   - Veggie cheese is OK which can be found in the fresh produce section\                                                                      at [employer] or [employer] or [employer]  ^>^>^>^>^>^>^>^>^>^>^>^>^>^>^>^>^>^>^>^>^>^>^>^>^>^>^>^>^>^>^>^>^>^>^>^>^  -   Also Triglycerides ( = 257 ) or fats in blood are too high                 (  Ideal or   Goal is less than 150  !  )    - Recommend avoid fried & greasy foods,  sweets / candy,   - Avoid white rice  (brown or wild rice or Quinoa is OK),   - Avoid white potatoes  (sweet potatoes are OK)   - Avoid anything made from white flour  - bagels, doughnuts, rolls, buns, biscuits, white and   wheat breads, pizza crust and traditional  pasta made of white flour & egg white  - (vegetarian pasta or spinach or wheat pasta is OK).    - Multi-grain bread is OK - like multi-grain flat bread or  sandwich thins.   - Avoid alcohol in excess.   - Exercise is also important.  ^>^>^>^>^>^>^>^>^>^>^>^>^>^>^>^>^>^>^>^>^>^>^>^>^>^>^>^>^>^>^>^>^>^>^>^>^ ^>^>^>^>^>^>^>^>^>^>^>^>^>^>^>^>^>^>^>^>^>^>^>^>^>^>^>^>^>^>^>^>^>^>^>^>^  -   A1c = 7.6% - is too high  - Ideal or goal is less than 5.7%   - Hopefully starting back on a low dose of Metformin &                                                                 Increasing Trulicity will help bring A1c down  ^>^>^>^>^>^>^>^>^>^>^>^>^>^>^>^>^>^>^>^>^>^>^>^>^>^>^>^>^>^>^>^>^>^>^>^>^ ^>^>^>^>^>^>^>^>^>^>^>^>^>^>^>^>^>^>^>^>^>^>^>^>^>^>^>^>^>^>^>^>^>^>^>^>^  -   Vit D = 47 - is very low  -   - Vitamin D goal is between 70-100.   - Please INCREASE  your Vitamin D & take 5,000 units EVERY DAY - 7 days  /week  !   - It is very important as a natural anti-inflammatory and helping the                           immune system protect against viral infections, like the Covid-19    helping hair, skin, and nails, as well as reducing stroke and heart attack risk.   - It helps your bones and helps with mood.  - It also decreases numerous cancer risks so please                                                                                           take it as directed.   - Low Vit D is associated with a 200-300% higher risk for CANCER   and 200-300% higher risk for HEART   ATTACK  &  STROKE.    - It is also associated with higher death rate at  younger ages,   autoimmune diseases like Rheumatoid arthritis, Lupus, Multiple Sclerosis.     - Also many other serious conditions, like depression, Alzheimer's  Dementia,  muscle aches, fatigue, fibromyalgia   ^>^>^>^>^>^>^>^>^>^>^>^>^>^>^>^>^>^>^>^>^>^>^>^>^>^>^>^>^>^>^>^>^>^>^>^>^ ^>^>^>^>^>^>^>^>^>^>^>^>^>^>^>^>^>^>^>^>^>^>^>^>^>^>^>^>^>^>^>^>^>^>^>^>^  -   All Else - CBC - Kidneys - Electrolytes - Liver - Magnesium & Thyroid    - all  Normal / OK ^>^>^>^>^>^>^>^>^>^>^>^>^>^>^>^>^>^>^>^>^>^>^>^>^>^>^>^>^>^>^>^>^>^>^>^>^ ^>^>^>^>^>^>^>^>^>^>^>^>^>^>^>^>^>^>^>^>^>^>^>^>^>^>^>^>^>^>^>^>^>^>^>^>^

## 2023-01-12 ENCOUNTER — Other Ambulatory Visit: Payer: Self-pay | Admitting: Nurse Practitioner

## 2023-01-12 DIAGNOSIS — G4700 Insomnia, unspecified: Secondary | ICD-10-CM

## 2023-01-13 ENCOUNTER — Other Ambulatory Visit: Payer: Self-pay | Admitting: Nurse Practitioner

## 2023-01-13 DIAGNOSIS — G4700 Insomnia, unspecified: Secondary | ICD-10-CM

## 2023-01-13 MED ORDER — ALPRAZOLAM 0.5 MG PO TABS: ORAL_TABLET | ORAL | 0 refills | Status: DC

## 2023-01-25 ENCOUNTER — Other Ambulatory Visit: Payer: Self-pay | Admitting: Internal Medicine

## 2023-01-25 DIAGNOSIS — F3341 Major depressive disorder, recurrent, in partial remission: Secondary | ICD-10-CM

## 2023-02-21 ENCOUNTER — Other Ambulatory Visit: Payer: Self-pay | Admitting: Nurse Practitioner

## 2023-02-21 DIAGNOSIS — F3341 Major depressive disorder, recurrent, in partial remission: Secondary | ICD-10-CM

## 2023-02-22 ENCOUNTER — Ambulatory Visit (INDEPENDENT_AMBULATORY_CARE_PROVIDER_SITE_OTHER): Payer: Medicare Other | Admitting: Nurse Practitioner

## 2023-02-22 ENCOUNTER — Encounter: Payer: Self-pay | Admitting: Nurse Practitioner

## 2023-02-22 VITALS — BP 128/72 | HR 82 | Temp 97.8°F | Wt 160.4 lb

## 2023-02-22 DIAGNOSIS — F419 Anxiety disorder, unspecified: Secondary | ICD-10-CM

## 2023-02-22 DIAGNOSIS — I2510 Atherosclerotic heart disease of native coronary artery without angina pectoris: Secondary | ICD-10-CM | POA: Diagnosis not present

## 2023-02-22 DIAGNOSIS — I3481 Nonrheumatic mitral (valve) annulus calcification: Secondary | ICD-10-CM

## 2023-02-22 DIAGNOSIS — F3341 Major depressive disorder, recurrent, in partial remission: Secondary | ICD-10-CM

## 2023-02-22 DIAGNOSIS — M25571 Pain in right ankle and joints of right foot: Secondary | ICD-10-CM

## 2023-02-22 DIAGNOSIS — E663 Overweight: Secondary | ICD-10-CM

## 2023-02-22 DIAGNOSIS — Z91199 Patient's noncompliance with other medical treatment and regimen due to unspecified reason: Secondary | ICD-10-CM

## 2023-02-22 DIAGNOSIS — Z794 Long term (current) use of insulin: Secondary | ICD-10-CM

## 2023-02-22 DIAGNOSIS — Z79899 Other long term (current) drug therapy: Secondary | ICD-10-CM

## 2023-02-22 DIAGNOSIS — N182 Chronic kidney disease, stage 2 (mild): Secondary | ICD-10-CM

## 2023-02-22 DIAGNOSIS — Z85038 Personal history of other malignant neoplasm of large intestine: Secondary | ICD-10-CM

## 2023-02-22 DIAGNOSIS — I700 Atherosclerosis of aorta: Secondary | ICD-10-CM | POA: Diagnosis not present

## 2023-02-22 DIAGNOSIS — F17200 Nicotine dependence, unspecified, uncomplicated: Secondary | ICD-10-CM

## 2023-02-22 DIAGNOSIS — C189 Malignant neoplasm of colon, unspecified: Secondary | ICD-10-CM | POA: Diagnosis not present

## 2023-02-22 DIAGNOSIS — D72829 Elevated white blood cell count, unspecified: Secondary | ICD-10-CM

## 2023-02-22 DIAGNOSIS — M25572 Pain in left ankle and joints of left foot: Secondary | ICD-10-CM

## 2023-02-22 DIAGNOSIS — E559 Vitamin D deficiency, unspecified: Secondary | ICD-10-CM

## 2023-02-22 DIAGNOSIS — E1122 Type 2 diabetes mellitus with diabetic chronic kidney disease: Secondary | ICD-10-CM

## 2023-02-22 DIAGNOSIS — K5900 Constipation, unspecified: Secondary | ICD-10-CM

## 2023-02-22 MED ORDER — PROMETHAZINE HCL 25 MG PO TABS: 25.0000 mg | ORAL_TABLET | Freq: Four times a day (QID) | ORAL | 3 refills | Status: AC | PRN

## 2023-02-22 NOTE — Progress Notes (Signed)
FOLLOW UP  Assessment:   Dorothy Boyd was seen today for an annual physcial  Diagnoses and all orders for this visit:  Aortic atherosclerosis ([name])/Hyperlipidemia Discussed lifestyle modifications. Recommended diet heavy in fruits and veggies, omega 3's. Decrease consumption of animal meats, cheeses, and dairy products. Remain active and exercise as tolerated. Continue to monitor. Check lipids/TSH  3 Vessel CAD Smoking cessation - patient not ready to quit. Denies angina Control blood pressure, cholesterol, glucose, increase exercise.  Recommend bASA, restart try every other day  Mitral valve annular calcificiation Denies concerning sx; stop smoking, control cholesterol and sugars Recommended cardiology evaluation  Malignant neoplasm of colon, unspecified part of colon ([name]) Hx of, s/p resection, has seen [name] Due for colonoscopy - plans to reach out to schedule  Type 2 diabetes mellitus with stage 2 chronic kidney disease, with long-term current use of insulin ([name])/Poorly Controlled Currently at 3 mg Trulicity Complicated by non-compliance, med intolerance Education: Reviewed 'ABCs' of diabetes management  Discussed goals to be met and/or maintained include A1C (<7) Blood pressure (<130/80) Cholesterol (LDL <70) Continue Eye Exam yearly  Continue Dental Exam Q6 mo Discussed dietary recommendations Discussed Physical Activity recommendations Check A1C  CKD stage 2 due to type 2 diabetes mellitus ([name]) Discussed how what you eat and drink can aide in kidney protection. Stay well hydrated. Avoid high salt foods. Avoid NSAIDS. Keep BP and BG well controlled.   Take medications as prescribed. Remain active and exercise as tolerated daily. Maintain weight.  Continue to monitor. Check CMP/GFR/Microablumin  Depression, major, recurrent, in partial remission ([name]) Celexa 20 mg daily, seroquel Follow up sooner if SE or other concerns, continue seroquel Lifestyle  discussed: diet/exerise, sleep hygiene, stress management, hydration  Vitamin D deficiency Continue daily supplement for goal of 60-100 Monitor vitamin d levels.   Smoker Discussed risks associated with tobacco use and advised to reduce or quit Patient is not ready to do so, had one completed 2022, but advised to consider strongly Lung cancer screening with low dose CT discussed as recommended by guidelines based on age, number of pack year history.  Discussed risks of screening including but not limited to false positives on xray, further testing or consultation with specialist, and possible false negative CT as well.   Overweight (BMI 25.0-29.9) Weight trending down Discussed appropriate BMI Diet modification. Physical activity. Encouraged/praised to build confidence.  Leukocytosis, unspecified type Following Dr. Clelia Croft; monitoring q28m  Check CBC with Differential/Platelet  Arthralgia of both feet Continue diclofenac gel PRN Discussed SCD's - Vascular may need to order  Anxiety Continue celexa Stress management techniques discussed, increase water, good sleep hygiene discussed, increase exercise, and increase veggies.   Poor compliance Continue to discuss barriers, encourage compliance  Medication management All medications discussed and reviewed in full. All questions and concerns regarding medications addressed.    Hx of colon cancer/constipation Obtain Abd x-ray imaging  Discussed decreasing dose of Trulicity Reman active; push fluids Daily Miralax and/or stool softener to achieve baseline BM x1-2/week.    Orders Placed This Encounter  Procedures   DG Abd 2 Views    Standing Status:   Future    Standing Expiration Date:   02/22/2024    Order Specific Question:   Reason for Exam (SYMPTOM  OR DIAGNOSIS REQUIRED)    Answer:   Hx of colon cancer; Constipation; SGLT-2 administration    Order Specific Question:   Preferred imaging location?    Answer:   [id number]   CBC with Differential/Platelet  COMPLETE METABOLIC PANEL WITH GFR   Lipid panel   Hemoglobin A1c   Notify office for further evaluation and treatment, questions or concerns if any reported s/s fail to improve.   The patient was advised to call back or seek an in-person evaluation if any symptoms worsen or if the condition fails to improve as anticipated.   Further disposition pending results of labs. Discussed med's effects and SE's.    I discussed the assessment and treatment plan with the patient. The patient was provided an opportunity to ask questions and all were answered. The patient agreed with the plan and demonstrated an understanding of the instructions.  Discussed med's effects and SE's. Screening labs and tests as requested with regular follow-up as recommended.  I provided 30 minutes of face-to-face time during this encounter including counseling, chart review, and critical decision making was preformed.  Future Appointments  Date Time Provider Department Center  05/31/2023  2:30 PM Adela Glimpse, NP [facility] None  08/29/2023  2:00 PM Adela Glimpse, NP [facility] None   Subjective:  Dorothy Boyd is a 67 y.o. female who presents for a general follow up. She has Leukocytosis; Hyperlipidemia associated with type 2 diabetes mellitus ([name]); Diabetes ([name]); Vitamin D deficiency; Anxiety; Overweight (BMI 25.0-29.9); Arthralgia of both feet; Depression, major, recurrent, in partial remission ([name]); Tobacco abuse; CKD stage 2 due to type 2 diabetes mellitus ([name]); Aortic atherosclerosis ([name]); Poor compliance; Mitral valve annular calcification; LAFB (left anterior fascicular block); 3-vessel CAD (per CT chest 11/2019); Constipation; Medication nonadherence due to intolerance; Calcification of abdominal aorta ([name]); Adrenal adenoma, left; History of colon cancer; Diabetic peripheral neuropathy associated with type 2 diabetes mellitus ([name]); B12 deficiency; and History  of adenomatous polyp of colon on their problem list.  Overall she reports feeling well however has noticed increase in constipation.  She did increase her dose of Trulicity to 3 mg 10/2022.   She has hx of colon cancer in 2010, s/p resection and established with Dr. Clelia Croft; she has had unexplained leukocytosis and microcytosis documented since 2010.  Etiology remains unclear with possible myeloproliferative disorder and was referred back to Oncology for evaluation and was felt to be stable in 02/18/2021 by Dr. Clelia Croft, was recommended monitoring and follow up only if trending up. She is recommended colonoscopy follow up for colon cancer screening, she went to [facility] medical reports had colonoscopy early 2022 but not full prep, did remove some polyps, was recommended 1 year follow up but has not completed.  She does have intermittent pain in BLE and feet d/t PVD/DM.  Admits to wearing compression stockings.   She continues to smoke,  has 30+ year smoking history, since 1978. Denies cough, dyspnea. She had CT lung cancer screening 12/2019 without concerning nodule but suggestive of small airway disease.   she has a diagnosis of depression and is currently on celexa 20 mg daily daily for mood and doing well. Also on seroquel and finds this helpful for sleep, failed trazodone and gabeptnin. She has been on xanax, takes 0.25 mg as needed for sleep a few days a week.   BMI is Body mass index is 28.41 kg/m., she has been working on diet and exercise. Wt Readings from Last 3 Encounters:  02/22/23 160 lb 6.4 oz (72.8 kg)  11/22/22 163 lb 6.4 oz (74.1 kg)  08/19/22 164 lb 3.2 oz (74.5 kg)   Has aortic atherosclerosis and 3 vessel CAD per CT 11/2017, 11/2019.  CT chest showed dilated pulm arteries; underwent  ECHO 03/06/2020 which showed normal pulmonary artery pressure, no atypical dilation, no hypertrophy. Did incidentally note mitral valve has moderate to severe calcification (hardening) - no regurgitation or  noted stenosis. Also known 3 vessel CAD per CT chest. She was referred to cardiology early 2022 but had to postpone, receptive to referral back today.  She does have BP cuff, 120s/70s at home is BP: 128/72 She does workout. She denies chest pain, shortness of breath, dizziness.   She is on cholesterol medication (rosuvastatin 40 mg daily and fenofibrate) and denies myalgias. Her cholesterol is not at goal. The cholesterol last visit was:   Lab Results  Component Value Date   CHOL 218 (H) 11/22/2022   HDL 42 (L) 11/22/2022   LDLCALC 136 (H) 11/22/2022   TRIG 257 (H) 11/22/2022   CHOLHDL 5.2 (H) 11/22/2022   She has had diabetes for 10 + years.   She has been working on diet  Patient does NOT check glucose - needle phobia and adamantly declines, insulin Admittedly poorly compliant with lifestyle and medications due to intolerance, typically GI sx Continues Trulicity recently increased to 3 mg Last A1C in the office was:  Lab Results  Component Value Date   HGBA1C 7.6 (H) 11/22/2022   She has CKD I/II associated with T2DM monitored at this office:  Lab Results  Component Value Date   GFRNONAA >60 06/28/2022   Patient is on Vitamin D supplement, 5000 IU gummies, when she remembers  Lab Results  Component Value Date   VD25OH 47 11/22/2022      Medication Review: Current Outpatient Medications on File Prior to Visit  Medication Sig Dispense Refill   acetaminophen (TYLENOL) 500 MG tablet Take 2 tablets (1,000 mg total) by mouth every 6 (six) hours. 120 tablet 3   ALPRAZolam (XANAX) 0.5 MG tablet TAKE 1/2 TO 1 (ONE-HALF TO ONE) TABLET BY MOUTH AT BEDTIME AS NEEDED FOR  SLEEP OR ANXIETY 25 tablet 0   Cholecalciferol (DIALYVITE VITAMIN D 5000) 125 MCG (5000 UT) capsule Take 5,000 Units by mouth once a week.     citalopram (CELEXA) 20 MG tablet Take 1 tablet by mouth once daily 90 tablet 0   Dulaglutide (TRULICITY) 0.75 MG/0.5ML SOPN Inject 0.75 mg into the skin once a week. 2 mL 0    Dulaglutide (TRULICITY) 3 MG/0.5ML SOPN Inject 3 mg as directed once a week. 6 mL 2   glimepiride (AMARYL) 4 MG tablet Take  1 tablet   2 x /day  with Meals for Diabetes 180 tablet 0   ibuprofen (ADVIL) 600 MG tablet Take 1 tablet (600 mg total) by mouth 4 (four) times daily. 120 tablet 1   ondansetron (ZOFRAN) 4 MG tablet Take 1 tablet (4 mg total) by mouth every 8 (eight) hours as needed for nausea or vomiting. 60 tablet 1   QUEtiapine (SEROQUEL) 25 MG tablet TAKE 1 TABLET BY MOUTH AT BEDTIME AS NEEDED FOR SLEEP 90 tablet 0   docusate sodium (COLACE) 100 MG capsule Take 1 capsule (100 mg total) by mouth 2 (two) times daily. (Patient not taking: Reported on 02/22/2023) 60 capsule 2   metFORMIN (GLUCOPHAGE-XR) 500 MG 24 hr tablet Take   1 to 2 tablets   2 x /day   with Meals  for Diabetes. (Patient not taking: Reported on 02/22/2023) 360 tablet 3   No current facility-administered medications on file prior to visit.    Allergies  Allergen Reactions   Codeine     Unknown reaction  Glipizide Nausea And Vomiting   Lunesta [Eszopiclone]     Excessive fatigue   Metformin And Related     Nausea and abdominal pain   Welchol [Colesevelam Hcl] Nausea Only     Current Problems (verified) Patient Active Problem List   Diagnosis Date Noted   History of adenomatous polyp of colon 09/18/2021   B12 deficiency 07/15/2021   Diabetic peripheral neuropathy associated with type 2 diabetes mellitus ([name]) 07/14/2021   History of colon cancer 07/10/2021   Calcification of abdominal aorta ([name]) 07/02/2020   Adrenal adenoma, left 07/02/2020   Medication nonadherence due to intolerance 06/25/2020   LAFB (left anterior fascicular block) 06/24/2020   3-vessel CAD (per CT chest 11/2019) 06/24/2020   Constipation 06/24/2020   Mitral valve annular calcification 03/07/2020   Poor compliance 12/07/2019   Aortic atherosclerosis ([name]) 08/01/2019   CKD stage 2 due to type 2 diabetes mellitus ([name]) 07/20/2019    Tobacco abuse 04/16/2019   Depression, major, recurrent, in partial remission ([name]) 01/10/2019   Arthralgia of both feet 10/04/2018   Overweight (BMI 25.0-29.9) 09/29/2017   Hyperlipidemia associated with type 2 diabetes mellitus ([name])    Diabetes ([name])    Vitamin D deficiency    Anxiety    Leukocytosis 09/18/2012    Screening Tests Immunization History  Administered Date(s) Administered   Pneumococcal Polysaccharide-23 01/07/2020    Patient Care Team: Lucky Cowboy, MD as PCP - General (Internal Medicine) Benjiman Core, MD (Inactive) as Consulting Physician (Oncology)  SURGICAL HISTORY She  has a past surgical history that includes Appendectomy; Partial colectomy (2010); Laparoscopic cholecystectomy (2010); Colonoscopy; Breast biopsy (Left, 06/10/2016); and Mass excision (N/A, 06/28/2022). FAMILY HISTORY Her family history includes Cancer - Ovarian in her mother; Diabetes in her paternal grandmother and sister; Gastric cancer in her paternal aunt; Heart disease in her brother; Hyperlipidemia in her mother and sister; Hypertension in her mother. SOCIAL HISTORY She  reports that she has been smoking cigarettes. She started smoking about 46 years ago. She has a 23.4 pack-year smoking history. She has never used smokeless tobacco. She reports that she does not drink alcohol and does not use drugs.  Review of Systems  Constitutional:  Negative for malaise/fatigue and weight loss.  HENT:  Negative for hearing loss and tinnitus.   Eyes:  Negative for blurred vision and double vision.  Respiratory:  Negative for cough, sputum production, shortness of breath and wheezing.   Cardiovascular:  Negative for chest pain, palpitations, orthopnea, claudication, leg swelling and PND.  Gastrointestinal:  Positive for constipation (chronic). Negative for abdominal pain, blood in stool, diarrhea, heartburn, melena, nausea and vomiting.  Genitourinary: Negative.   Musculoskeletal:  Negative for  falls (1 fall in 12 months ), joint pain (bil feet, improved) and myalgias.  Skin:  Negative for rash.  Neurological:  Negative for dizziness, tingling, sensory change, weakness and headaches.  Endo/Heme/Allergies:  Negative for polydipsia.  Psychiatric/Behavioral:  Negative for depression, memory loss, substance abuse and suicidal ideas. The patient is not nervous/anxious and does not have insomnia.   All other systems reviewed and are negative.    Objective:     Today's Vitals   02/22/23 1549  BP: 128/72  Pulse: 82  Temp: 97.8 F (36.6 C)  SpO2: 97%  Weight: 160 lb 6.4 oz (72.8 kg)   Body mass index is 28.41 kg/m.  General appearance: alert, no distress, WD/WN, female HEENT: normocephalic, sclerae anicteric, TMs pearly, nares patent, no discharge or erythema, pharynx normal Oral cavity:  MMM, no lesions Neck: supple, no lymphadenopathy, no thyromegaly, no masses Heart: RRR, normal S1, S2, no murmurs  Lungs: CTA bilaterally, no wheezes, rhonchi, or rales Abdomen: +bs, firm, mildly distended, generalized tenderness with light palpation, no distinct palpable masses or organomegaly; tenderness limiting exam Musculoskeletal: nontender, no swelling, no obvious deformity Extremities: no edema, no cyanosis, no clubbing Pulses: 2+ symmetric, upper and lower extremities, normal cap refill Neurological: alert, oriented x 3, CN2-12 intact, strength normal upper extremities and lower extremities, sensation normal throughout, DTRs 2+ throughout, no cerebellar signs, gait slow steady Psychiatric: normal affect, behavior normal, pleasant   EKG:  No new changes - NSR   Arley Salamone, NP   02/22/2023

## 2023-02-22 NOTE — Patient Instructions (Signed)

## 2023-02-23 ENCOUNTER — Encounter: Payer: Self-pay | Admitting: Nurse Practitioner

## 2023-02-23 LAB — COMPLETE METABOLIC PANEL WITH GFR
AG Ratio: 1.8 (calc) (ref 1.0–2.5)
ALT: 10 U/L (ref 6–29)
AST: 11 U/L (ref 10–35)
Albumin: 4.5 g/dL (ref 3.6–5.1)
Alkaline phosphatase (APISO): 90 U/L (ref 37–153)
BUN: 14 mg/dL (ref 7–25)
CO2: 26 mmol/L (ref 20–32)
Calcium: 9.9 mg/dL (ref 8.6–10.4)
Chloride: 101 mmol/L (ref 98–110)
Creat: 0.87 mg/dL (ref 0.50–1.05)
Globulin: 2.5 g/dL (calc) (ref 1.9–3.7)
Glucose, Bld: 100 mg/dL — ABNORMAL HIGH (ref 65–99)
Potassium: 4.9 mmol/L (ref 3.5–5.3)
Sodium: 137 mmol/L (ref 135–146)
Total Bilirubin: 0.5 mg/dL (ref 0.2–1.2)
Total Protein: 7 g/dL (ref 6.1–8.1)
eGFR: 73 mL/min/{1.73_m2} (ref >=60)

## 2023-02-23 LAB — CBC WITH DIFFERENTIAL/PLATELET
Absolute Monocytes: 672 cells/uL (ref 200–950)
Basophils Absolute: 134 cells/uL (ref 0–200)
Basophils Relative: 0.8 %
Eosinophils Absolute: 386 cells/uL (ref 15–500)
Eosinophils Relative: 2.3 %
HCT: 42.2 % (ref 35.0–45.0)
Hemoglobin: 12.8 g/dL (ref 11.7–15.5)
Lymphs Abs: 5662 cells/uL — ABNORMAL HIGH (ref 850–3900)
MCH: 19 pg — ABNORMAL LOW (ref 27.0–33.0)
MCHC: 30.3 g/dL — ABNORMAL LOW (ref 32.0–36.0)
MCV: 62.8 fL — ABNORMAL LOW (ref 80.0–100.0)
MPV: 9.9 fL (ref 7.5–12.5)
Monocytes Relative: 4 %
Neutro Abs: 9946 cells/uL — ABNORMAL HIGH (ref 1500–7800)
Neutrophils Relative %: 59.2 %
Platelets: 439 10*3/uL — ABNORMAL HIGH (ref 140–400)
RBC: 6.72 10*6/uL — ABNORMAL HIGH (ref 3.80–5.10)
RDW: 17.6 % — ABNORMAL HIGH (ref 11.0–15.0)
Total Lymphocyte: 33.7 %
WBC: 16.8 10*3/uL — ABNORMAL HIGH (ref 3.8–10.8)

## 2023-02-23 LAB — HEMOGLOBIN A1C
Hgb A1c MFr Bld: 7 % of total Hgb — ABNORMAL HIGH (ref <5.7)
Mean Plasma Glucose: 154 mg/dL
eAG (mmol/L): 8.5 mmol/L

## 2023-02-23 LAB — LIPID PANEL
Cholesterol: 222 mg/dL — ABNORMAL HIGH (ref <200)
HDL: 39 mg/dL — ABNORMAL LOW (ref >=50)
LDL Cholesterol (Calc): 150 mg/dL (calc) — ABNORMAL HIGH
Non-HDL Cholesterol (Calc): 183 mg/dL (calc) — ABNORMAL HIGH (ref <130)
Total CHOL/HDL Ratio: 5.7 (calc) — ABNORMAL HIGH (ref <5.0)
Triglycerides: 193 mg/dL — ABNORMAL HIGH (ref <150)

## 2023-02-23 LAB — CBC MORPHOLOGY

## 2023-02-28 ENCOUNTER — Other Ambulatory Visit: Payer: Self-pay | Admitting: Nurse Practitioner

## 2023-02-28 ENCOUNTER — Other Ambulatory Visit: Payer: Self-pay | Admitting: Internal Medicine

## 2023-02-28 DIAGNOSIS — G4700 Insomnia, unspecified: Secondary | ICD-10-CM

## 2023-03-14 ENCOUNTER — Other Ambulatory Visit: Payer: Self-pay | Admitting: Nurse Practitioner

## 2023-03-14 ENCOUNTER — Encounter: Payer: Self-pay | Admitting: Nurse Practitioner

## 2023-03-14 MED ORDER — PREDNISONE 10 MG PO TABS
ORAL_TABLET | ORAL | 0 refills | Status: DC
Start: 2023-03-14 — End: 2023-03-30

## 2023-03-23 ENCOUNTER — Encounter: Payer: Self-pay | Admitting: Nurse Practitioner

## 2023-03-30 ENCOUNTER — Ambulatory Visit (INDEPENDENT_AMBULATORY_CARE_PROVIDER_SITE_OTHER): Payer: Medicare Other | Admitting: Nurse Practitioner

## 2023-03-30 ENCOUNTER — Encounter: Payer: Self-pay | Admitting: Nurse Practitioner

## 2023-03-30 VITALS — BP 112/60 | HR 83 | Temp 98.0°F | Ht 63.0 in | Wt 160.0 lb

## 2023-03-30 DIAGNOSIS — N179 Acute kidney failure, unspecified: Secondary | ICD-10-CM | POA: Diagnosis not present

## 2023-03-30 DIAGNOSIS — D72829 Elevated white blood cell count, unspecified: Secondary | ICD-10-CM | POA: Diagnosis not present

## 2023-03-30 DIAGNOSIS — I2510 Atherosclerotic heart disease of native coronary artery without angina pectoris: Secondary | ICD-10-CM

## 2023-03-30 DIAGNOSIS — Z09 Encounter for follow-up examination after completed treatment for conditions other than malignant neoplasm: Secondary | ICD-10-CM | POA: Diagnosis not present

## 2023-03-30 DIAGNOSIS — K529 Noninfective gastroenteritis and colitis, unspecified: Secondary | ICD-10-CM | POA: Diagnosis not present

## 2023-03-30 DIAGNOSIS — I700 Atherosclerosis of aorta: Secondary | ICD-10-CM

## 2023-03-30 NOTE — Progress Notes (Signed)
Hospital follow up  Assessment and Plan: Hospital visit follow up for:   Hospital discharge follow-up Reviewed discharge instructions in full including medication changes, diagnostics, labs, and future follow ups appointment. All questions and concerns addressed.   - CBC with Differential/Platelet - COMPLETE METABOLIC PANEL WITH GFR  Enterocolitis Completed abx course - improving Monitor WBC  - CBC with Differential/Platelet  Leukocytosis, unspecified type  - CBC with Differential/Platelet  AKI (acute kidney injury) ([name]) Stay well hydrated. Avoid high salt foods. Avoid NSAIDS. Keep BP and BG well controlled.   Take medications as prescribed. Remain active and exercise as tolerated daily. Maintain weight.  Continue to monitor.  - COMPLETE METABOLIC PANEL WITH GFR  3-vessel CAD (per CT chest 11/2019)/Calcification of abdominal aorta ([name]) Follows with Dr. Allyson Sabal    All medications were reviewed with patient and fully reconciled. All questions answered fully, and patient and family members were encouraged to call the office with any further questions or concerns. Discussed goal to avoid readmission related to this diagnosis.   Over 30 minutes of exam, counseling, chart review, and complex, high/moderate level critical decision making was performed this visit.   Future Appointments  Date Time Provider Department Center  05/31/2023  2:30 PM Adela Glimpse, NP [facility] None  08/29/2023  2:00 PM Adela Glimpse, NP [facility] None    HPI 67 y.o.female presents for follow up for transition from recent hospitalization ER stay. Admit date to the hospital was 06/28/22, patient was discharged from the hospital on 06/28/22 and our clinical staff contacted the office the day after discharge to set up a follow up appointment. The discharge summary, medications, and diagnostic test results were reviewed before meeting with the patient. The patient was admitted for enterocolitis:    Presented with complaints of vomiting, abdominal cramping and diarrhea for 2 days after she ate 1/4 pound a burger from [employer]. She described very watery stools but denied noticing any blood in them. Denied any fever or chills.   CXR revealed cardiomegaly with mild pulmonary venous congestion and bilateral basilar densities present subsegmental atelectasis.    CT showed air-fluid levels throughout nondilated colon as well as some fluid within normal caliber small bowel. Findings related to enterocolitis.  Coronary and aortoiliac calcified atherosclerotic plaque. Calcified plaque is very prominent in the distal aorta where there is probably some aortic stenosis. She follows with Dr. Allyson Sabal was supposed to have visit 07/2022 for carotid appt but she canceled.   She was treated with empiric Cipro and Flagyl d/t leukocytosis of 23.  Acute kidney injury-resolved after IV fluid hydration. Scr 1.07 >> 0.92 CrCl 55 mL/min.  Medical noncompliance-threatening to leave AGAINST MEDICAL ADVICE but stayed for the entire hospital stay.   Home health is not involved.   Images while in the hospital: No results found.   Current Outpatient Medications (Endocrine & Metabolic):    Dulaglutide (TRULICITY) 3 MG/0.5ML SOPN, Inject 3 mg as directed once a week.   glimepiride (AMARYL) 4 MG tablet, TAKE 1 TABLET BY MOUTH TWICE DAILY WITH MEALS FOR DIABETES   metFORMIN (GLUCOPHAGE-XR) 500 MG 24 hr tablet, Take   1 to 2 tablets   2 x /day   with Meals  for Diabetes. (Patient not taking: Reported on 02/22/2023)   predniSONE (DELTASONE) 10 MG tablet, 1 tab 3 x day for 2 days, then 1 tab 2 x day for 2 days, then 1 tab 1 x day for 3 days   Current Outpatient Medications (Respiratory):    promethazine (  PHENERGAN) 25 MG tablet, Take 1 tablet (25 mg total) by mouth every 6 (six) hours as needed for nausea or vomiting.  Current Outpatient Medications (Analgesics):    acetaminophen (TYLENOL) 500 MG tablet, Take 2  tablets (1,000 mg total) by mouth every 6 (six) hours.   ibuprofen (ADVIL) 600 MG tablet, Take 1 tablet (600 mg total) by mouth 4 (four) times daily.   Current Outpatient Medications (Other):    ALPRAZolam (XANAX) 0.5 MG tablet, TAKE 1/2 TO 1 (ONE-HALF TO ONE) TABLET BY MOUTH AT BEDTIME AS NEEDED FOR  SLEEP  OR  ANXIETY   Cholecalciferol (DIALYVITE VITAMIN D 5000) 125 MCG (5000 UT) capsule, Take 5,000 Units by mouth once a week.   citalopram (CELEXA) 20 MG tablet, Take 1 tablet by mouth once daily   docusate sodium (COLACE) 100 MG capsule, Take 1 capsule (100 mg total) by mouth 2 (two) times daily.   ondansetron (ZOFRAN) 4 MG tablet, Take 1 tablet (4 mg total) by mouth every 8 (eight) hours as needed for nausea or vomiting.   QUEtiapine (SEROQUEL) 25 MG tablet, TAKE 1 TABLET BY MOUTH AT BEDTIME AS NEEDED FOR SLEEP  Past Medical History:  Diagnosis Date   Anemia    Anxiety    Cervical dysplasia    Colon cancer ([name])    Depression    Diabetes ([name])    History of colon cancer, stage II 09/18/2012   Hyperlipidemia    Pneumonia    Right-sided Bell's palsy 01/03/2019   Vitamin D deficiency      Allergies  Allergen Reactions   Codeine     Unknown reaction    Glipizide Nausea And Vomiting   Lunesta [Eszopiclone]     Excessive fatigue   Metformin And Related     Nausea and abdominal pain   Welchol [Colesevelam Hcl] Nausea Only    ROS: all negative except above.   Physical Exam: Filed Weights   03/30/23 1437  Weight: 160 lb (72.6 kg)   BP 112/60   Pulse 83   Temp 98 F (36.7 C)   Ht 5\' 3"  (1.6 m)   Wt 160 lb (72.6 kg)   SpO2 99%   BMI 28.34 kg/m  General Appearance: Well nourished, in no apparent distress. Eyes: PERRLA, EOMs, conjunctiva no swelling or erythema Sinuses: No Frontal/maxillary tenderness ENT/Mouth: Ext aud canals clear, TMs without erythema, bulging. No erythema, swelling, or exudate on post pharynx.  Tonsils not swollen or erythematous. Hearing normal.   Neck: Supple, thyroid normal.  Respiratory: Respiratory effort normal, BS equal bilaterally without rales, rhonchi, wheezing or stridor.  Cardio: RRR with no MRGs. Brisk peripheral pulses without edema.  Abdomen: Soft, + BS.  Non tender, no guarding, rebound, hernias, masses. Lymphatics: Non tender without lymphadenopathy.  Musculoskeletal: Full ROM, 5/5 strength, normal gait.  Skin: Warm, dry without rashes, lesions, ecchymosis.  Neuro: Cranial nerves intact. Normal muscle tone, no cerebellar symptoms. Sensation intact.  Psych: Awake and oriented X 3, normal affect, Insight and Judgment appropriate.     Adela Glimpse, NP 3:24 PM [facility]

## 2023-03-30 NOTE — Patient Instructions (Signed)
Colitis  Colitis is a condition in which the colon is inflamed. It can cause diarrhea, blood in the stool, and abdominal pain. Colitis can last a short time (be acute), or it may last a long time (become chronic). What are the causes? This condition may be caused by: Infections from viruses or bacteria. A reaction to medicine. Certain autoimmune diseases, such as Crohn's disease or ulcerative colitis. Radiation treatment. Decreased blood flow to the bowel (ischemia). What are the signs or symptoms? Symptoms of this condition include: Diarrhea, blood in the stool, or black, tarry stool. Pain in the joints or abdominal pain. Fever or fatigue. Vomiting. Weight loss. Bloating. Having fewer bowel movements than usual. A strong and sudden urge to have a bowel movement. Feeling like the bowel is not empty after a bowel movement. How is this diagnosed? This condition may be diagnosed based on a stool test and a blood test. You may also have other tests, such as: X-rays. CT scan. Colonoscopy. Endoscopy. Biopsy. How is this treated? Treatment for this condition depends on the cause. This condition may be treated with: Steps to rest the bowel, such as not eating or drinking for a period of time. Fluids that are given through an IV. Medicine for pain and diarrhea. Antibiotic medicines. Cortisone medicines. Surgery. Follow these instructions at home: Eating and drinking  Follow instructions from your health care provider about eating or drinking restrictions. Drink enough fluid to keep your urine pale yellow. Work with a dietitian to determine whether certain foods cause your condition to flare up. Avoid foods or drinks that cause flare-ups. Eat a well-balanced diet. General instructions If you were prescribed an antibiotic medicine, take it as told by your health care provider. Do not stop taking the antibiotic even if you start to feel better. Take over-the-counter and prescription  medicines only as told by your health care provider. Keep all follow-up visits. This is important. Contact a health care provider if: Your symptoms do not go away. You develop new symptoms. Get help right away if: You have a fever that does not go away with treatment. You develop chills. You have extreme weakness, fainting, or dehydration. You vomit repeatedly. You develop severe pain in your abdomen. You pass bloody or tarry stool. Summary Colitis is a condition in which the colon is inflamed. Colitis can last a short time (be acute), or it may last a long time (become chronic). Treatment for this condition depends on the cause and may include resting the bowel, taking medicines, or having surgery. If you were prescribed an antibiotic medicine, take it as told by your health care provider. Do not stop taking the antibiotic even if you start to feel better. Get help right away if you develop severe pain in your abdomen. Keep all follow-up visits. This is important. This information is not intended to replace advice given to you by your health care provider. Make sure you discuss any questions you have with your health care provider. Document Revised: 01/22/2020 Document Reviewed: 01/22/2020 Elsevier Patient Education  2024 [employer].

## 2023-03-31 ENCOUNTER — Encounter: Payer: Self-pay | Admitting: Nurse Practitioner

## 2023-03-31 LAB — CBC WITH DIFFERENTIAL/PLATELET
Absolute Lymphocytes: 5267 {cells}/uL — ABNORMAL HIGH (ref 850–3900)
Absolute Monocytes: 724 {cells}/uL (ref 200–950)
Basophils Absolute: 127 {cells}/uL (ref 0–200)
Basophils Relative: 0.7 %
Eosinophils Absolute: 796 {cells}/uL — ABNORMAL HIGH (ref 15–500)
Eosinophils Relative: 4.4 %
HCT: 39.7 % (ref 35.0–45.0)
Hemoglobin: 12 g/dL (ref 11.7–15.5)
MCH: 19.3 pg — ABNORMAL LOW (ref 27.0–33.0)
MCHC: 30.2 g/dL — ABNORMAL LOW (ref 32.0–36.0)
MCV: 63.9 fL — ABNORMAL LOW (ref 80.0–100.0)
MPV: 10.1 fL (ref 7.5–12.5)
Monocytes Relative: 4 %
Neutro Abs: 11186 {cells}/uL — ABNORMAL HIGH (ref 1500–7800)
Neutrophils Relative %: 61.8 %
Platelets: 430 10*3/uL — ABNORMAL HIGH (ref 140–400)
RBC: 6.21 10*6/uL — ABNORMAL HIGH (ref 3.80–5.10)
RDW: 18.9 % — ABNORMAL HIGH (ref 11.0–15.0)
Total Lymphocyte: 29.1 %
WBC: 18.1 10*3/uL — ABNORMAL HIGH (ref 3.8–10.8)

## 2023-03-31 LAB — COMPLETE METABOLIC PANEL WITH GFR
AG Ratio: 1.8 (calc) (ref 1.0–2.5)
ALT: 16 U/L (ref 6–29)
AST: 12 U/L (ref 10–35)
Albumin: 4.3 g/dL (ref 3.6–5.1)
Alkaline phosphatase (APISO): 82 U/L (ref 37–153)
BUN: 15 mg/dL (ref 7–25)
CO2: 27 mmol/L (ref 20–32)
Calcium: 9.5 mg/dL (ref 8.6–10.4)
Chloride: 101 mmol/L (ref 98–110)
Creat: 0.8 mg/dL (ref 0.50–1.05)
Globulin: 2.4 g/dL (ref 1.9–3.7)
Glucose, Bld: 179 mg/dL — ABNORMAL HIGH (ref 65–99)
Potassium: 4.5 mmol/L (ref 3.5–5.3)
Sodium: 137 mmol/L (ref 135–146)
Total Bilirubin: 0.4 mg/dL (ref 0.2–1.2)
Total Protein: 6.7 g/dL (ref 6.1–8.1)
eGFR: 80 mL/min/{1.73_m2} (ref >=60)

## 2023-03-31 LAB — CBC MORPHOLOGY

## 2023-03-31 MED ORDER — CIPROFLOXACIN HCL 250 MG PO TABS
ORAL_TABLET | ORAL | 0 refills | Status: DC
Start: 2023-03-31 — End: 2023-06-06

## 2023-04-13 ENCOUNTER — Encounter: Payer: Self-pay | Admitting: Internal Medicine

## 2023-04-20 ENCOUNTER — Other Ambulatory Visit: Payer: Self-pay | Admitting: Nurse Practitioner

## 2023-04-20 DIAGNOSIS — F3341 Major depressive disorder, recurrent, in partial remission: Secondary | ICD-10-CM

## 2023-04-22 ENCOUNTER — Other Ambulatory Visit: Payer: Self-pay | Admitting: Nurse Practitioner

## 2023-04-22 DIAGNOSIS — G4700 Insomnia, unspecified: Secondary | ICD-10-CM

## 2023-05-10 ENCOUNTER — Ambulatory Visit: Payer: Medicare Other | Admitting: Nurse Practitioner

## 2023-05-18 ENCOUNTER — Ambulatory Visit: Payer: Medicare Other | Admitting: Nurse Practitioner

## 2023-05-26 ENCOUNTER — Other Ambulatory Visit: Payer: Self-pay | Admitting: Nurse Practitioner

## 2023-05-26 DIAGNOSIS — F3341 Major depressive disorder, recurrent, in partial remission: Secondary | ICD-10-CM

## 2023-05-30 ENCOUNTER — Ambulatory Visit
Admission: RE | Admit: 2023-05-30 | Discharge: 2023-05-30 | Disposition: A | Discharge status: Home / Self Care | Payer: Medicare Other | Source: Ambulatory Visit | Attending: Nurse Practitioner | Admitting: Nurse Practitioner

## 2023-05-30 ENCOUNTER — Other Ambulatory Visit: Payer: Self-pay | Admitting: Nurse Practitioner

## 2023-05-30 DIAGNOSIS — K5900 Constipation, unspecified: Secondary | ICD-10-CM

## 2023-05-30 DIAGNOSIS — Z85038 Personal history of other malignant neoplasm of large intestine: Secondary | ICD-10-CM

## 2023-05-30 DIAGNOSIS — G4700 Insomnia, unspecified: Secondary | ICD-10-CM

## 2023-05-31 ENCOUNTER — Ambulatory Visit: Payer: Medicare Other | Admitting: Nurse Practitioner

## 2023-06-06 ENCOUNTER — Encounter: Payer: Self-pay | Admitting: Nurse Practitioner

## 2023-06-06 ENCOUNTER — Ambulatory Visit (INDEPENDENT_AMBULATORY_CARE_PROVIDER_SITE_OTHER): Payer: Medicare Other | Admitting: Nurse Practitioner

## 2023-06-06 VITALS — BP 128/64 | HR 85 | Temp 97.5°F | Ht 63.0 in | Wt 159.4 lb

## 2023-06-06 DIAGNOSIS — Z0000 Encounter for general adult medical examination without abnormal findings: Secondary | ICD-10-CM

## 2023-06-06 DIAGNOSIS — E1169 Type 2 diabetes mellitus with other specified complication: Secondary | ICD-10-CM | POA: Diagnosis not present

## 2023-06-06 DIAGNOSIS — Z0001 Encounter for general adult medical examination with abnormal findings: Secondary | ICD-10-CM

## 2023-06-06 DIAGNOSIS — Z91199 Patient's noncompliance with other medical treatment and regimen due to unspecified reason: Secondary | ICD-10-CM

## 2023-06-06 DIAGNOSIS — F17200 Nicotine dependence, unspecified, uncomplicated: Secondary | ICD-10-CM

## 2023-06-06 DIAGNOSIS — E559 Vitamin D deficiency, unspecified: Secondary | ICD-10-CM

## 2023-06-06 DIAGNOSIS — C189 Malignant neoplasm of colon, unspecified: Secondary | ICD-10-CM | POA: Diagnosis not present

## 2023-06-06 DIAGNOSIS — E1122 Type 2 diabetes mellitus with diabetic chronic kidney disease: Secondary | ICD-10-CM

## 2023-06-06 DIAGNOSIS — I700 Atherosclerosis of aorta: Secondary | ICD-10-CM

## 2023-06-06 DIAGNOSIS — F3341 Major depressive disorder, recurrent, in partial remission: Secondary | ICD-10-CM

## 2023-06-06 DIAGNOSIS — I3481 Nonrheumatic mitral (valve) annulus calcification: Secondary | ICD-10-CM | POA: Diagnosis not present

## 2023-06-06 DIAGNOSIS — I2510 Atherosclerotic heart disease of native coronary artery without angina pectoris: Secondary | ICD-10-CM | POA: Diagnosis not present

## 2023-06-06 DIAGNOSIS — R6889 Other general symptoms and signs: Secondary | ICD-10-CM | POA: Diagnosis not present

## 2023-06-06 DIAGNOSIS — Z23 Encounter for immunization: Secondary | ICD-10-CM

## 2023-06-06 DIAGNOSIS — Z5320 Procedure and treatment not carried out because of patient's decision for unspecified reasons: Secondary | ICD-10-CM

## 2023-06-06 DIAGNOSIS — E663 Overweight: Secondary | ICD-10-CM

## 2023-06-06 DIAGNOSIS — D72829 Elevated white blood cell count, unspecified: Secondary | ICD-10-CM

## 2023-06-06 DIAGNOSIS — M25571 Pain in right ankle and joints of right foot: Secondary | ICD-10-CM

## 2023-06-06 DIAGNOSIS — F419 Anxiety disorder, unspecified: Secondary | ICD-10-CM

## 2023-06-06 DIAGNOSIS — Z79899 Other long term (current) drug therapy: Secondary | ICD-10-CM

## 2023-06-06 NOTE — Progress Notes (Signed)
 FOLLOW UP  Assessment:   [name] was seen today for an annual physcial  Diagnoses and all orders for this visit:  CPE Due annually  Health maintenance reviewed  Aortic atherosclerosis ([name])/Hyperlipidemia Discussed lifestyle modifications. Recommended diet heavy in fruits and veggies, omega 3's. Decrease consumption of animal meats, cheeses, and dairy products. Remain active and exercise as tolerated. Continue to monitor. Check lipids/TSH  3 Vessel CAD Smoking cessation - patient not ready to quit. Denies angina Control blood pressure, cholesterol, glucose, increase exercise.  Recommend bASA, restart try every other day  Mitral valve annular calcificiation Denies concerning sx; stop smoking, control cholesterol and sugars Recommended cardiology evaluation  Malignant neoplasm of colon, unspecified part of colon ([name]) Hx of, s/p resection, has [name] Due for colonoscopy - plans to reach out to schedule  Type 2 diabetes mellitus with stage 1 chronic kidney disease, with long-term current use of insulin  ([name])/Poorly Controlled Complicated by non-compliance, med intolerance Continue Dulaglutide /Trulicity  Education: Reviewed 'ABCs' of diabetes management  Discussed goals to be met and/or maintained include A1C (<7) Blood pressure (<130/80) Cholesterol (LDL <70) Continue Eye Exam yearly  Continue Dental Exam Q6 mo Discussed dietary recommendations Discussed Physical Activity recommendations Foot exam UTD Check A1C  CKD stage 2 due to type 2 diabetes mellitus ([name]) Discussed how what you eat and drink can aide in kidney protection. Stay well hydrated. Avoid high salt foods. Avoid NSAIDS. Keep BP and BG well controlled.   Take medications as prescribed. Remain active and exercise as tolerated daily. Maintain weight.  Continue to monitor. Check CMP/GFR/Microablumin  Depression, major, recurrent, in partial remission ([name]) Celexa  20 mg daily,  seroquel  Follow up sooner if SE or other concerns, continue seroquel  Lifestyle discussed: diet/exerise, sleep hygiene, stress management, hydration  Vitamin D  deficiency Continue daily supplement for goal of 60-100 Monitor vitamin d  levels.   Smoker Discussed risks associated with tobacco use and advised to reduce or quit Patient is not ready to do so, had one completed 2022, but advised to consider strongly Lung cancer screening with low dose CT discussed as recommended by guidelines based on age, number of pack year history.  Discussed risks of screening including but not limited to false positives on xray, further testing or consultation with specialist, and possible false negative CT as well.   Overweight (BMI 25.0-29.9) Discussed appropriate BMI Diet modification. Physical activity. Encouraged/praised to build confidence.  Leukocytosis, unspecified type Has recently seen Dr. Amadeo; monitoring q76m  Check CBC with Differential/Platelet  Arthralgia of both feet Continue diclofenac gel PRN Discussed SCD's - Vascular may need to order  Anxiety Continue celexa  Stress management techniques discussed, increase water, good sleep hygiene discussed, increase exercise, and increase veggies.   Poor compliance Continue to discuss barriers, encourage compliance  Osteoporosis screening Patient declines at this time  Medication management All medications discussed and reviewed in full. All questions and concerns regarding medications addressed.    Orders Placed This Encounter  Procedures   Pneumococcal conjugate vaccine 20-valent (Prevnar 20)   CBC with Differential/Platelet   COMPLETE METABOLIC PANEL WITH GFR   Lipid panel   Hemoglobin A1c   Notify office for further evaluation and treatment, questions or concerns if any reported s/s fail to improve.   The patient was advised to call back or seek an in-person evaluation if any symptoms worsen or if the condition fails to  improve as anticipated.   Further disposition pending results of labs. Discussed med's effects and SE's.    I discussed  the assessment and treatment plan with the patient. The patient was provided an opportunity to ask questions and all were answered. The patient agreed with the plan and demonstrated an understanding of the instructions.  Discussed med's effects and SE's. Screening labs and tests as requested with regular follow-up as recommended.  I provided 35 minutes of face-to-face time during this encounter including counseling, chart review, and critical decision making was preformed.  Future Appointments  Date Time Provider Department Center  08/29/2023  2:00 PM Laurice President, NP [facility] None  06/06/2024  3:00 PM Laurice President, NP [facility] None     Plan:   During the course of the visit the patient was educated and counseled about appropriate screening and preventive services including:   Pneumococcal vaccine  Prevnar 13 Influenza vaccine Td vaccine Screening electrocardiogram Bone densitometry screening Colorectal cancer screening Diabetes screening Glaucoma screening Nutrition counseling  Advanced directives: requested   Subjective:  Dorothy Boyd is a 68 y.o. female who presents for annual CPE. She has Leukocytosis; Hyperlipidemia associated with type 2 diabetes mellitus ([name]); Diabetes ([name]); Vitamin D  deficiency; Anxiety; Overweight (BMI 25.0-29.9); Arthralgia of both feet; Depression, major, recurrent, in partial remission ([name]); Tobacco abuse; CKD stage 2 due to type 2 diabetes mellitus ([name]); Aortic atherosclerosis ([name]); Poor compliance; Mitral valve annular calcification; LAFB (left anterior fascicular block); 3-vessel CAD (per CT chest 11/2019); Constipation; Medication nonadherence due to intolerance; Calcification of abdominal aorta ([name]); Adrenal adenoma, left; History of colon cancer; Diabetic peripheral neuropathy associated with type 2 diabetes mellitus  ([name]); B12 deficiency; and History of adenomatous polyp of colon on their problem list.  She is married, 2 children, 5 grandchildren. She is retired warehouse manager work.  Overall she reports feeling well.    She does have intermittent pain in BLE and feet d/t PVD/DM.  Admits to wearing compression stockings.   She continues to smoke,  has 30+ year smoking history, since 1978. Denies cough, dyspnea. She had CT lung cancer screening 12/2019 without concerning nodule but suggestive of small airway disease.   She has hx of colon cancer in 2010, s/p resection and established with Dr. Amadeo; she has had unexplained leukocytosis and microcytosis documented since 2010.  Etiology remains unclear with possible myeloproliferative disorder and was referred back to Oncology for evaluation and was felt to be stable in 02/18/2021 by Dr. Amadeo, was recommended monitoring and follow up only if trending up. She is recommended colonoscopy follow up for colon cancer screening, she went to [facility] earlier this year, reports had colonoscopy early 2022 but not full prep, did remove some polyps.  she has a diagnosis of depression and is currently on celexa  20 mg daily daily for mood and doing well. Also on seroquel  and finds this helpful for sleep, failed trazodone  and gabeptnin. She has been on xanax , takes 0.25 mg as needed for sleep a few days a week.   BMI is Body mass index is 28.24 kg/m., she has not been working on diet and exercise. Wt Readings from Last 3 Encounters:  06/06/23 159 lb 6.4 oz (72.3 kg)  03/30/23 160 lb (72.6 kg)  02/22/23 160 lb 6.4 oz (72.8 kg)   Has aortic atherosclerosis and 3 vessel CAD per CT 11/2017, 11/2019.  CT chest showed dilated pulm arteries; underwent ECHO 03/06/2020 which showed normal pulmonary artery pressure, no atypical dilation, no hypertrophy. Did incidentally note mitral valve has moderate to severe calcification (hardening) - no regurgitation or noted stenosis. Also known 3  vessel CAD  per CT chest. She was referred to cardiology early 2022 but had to postpone, receptive to referral back today.  She does have BP cuff, 120s/70s at home is BP: 128/64 She does workout. She denies chest pain, shortness of breath, dizziness.   She is on cholesterol medication (rosuvastatin  40 mg daily and fenofibrate ) and denies myalgias. Her cholesterol is not at goal. The cholesterol last visit was:   Lab Results  Component Value Date   CHOL 222 (H) 02/22/2023   HDL 39 (L) 02/22/2023   LDLCALC 150 (H) 02/22/2023   TRIG 193 (H) 02/22/2023   CHOLHDL 5.7 (H) 02/22/2023   She has had diabetes for 10 + years.   She has been working on diet  Patient does NOT check glucose - needle phobia and adamantly declines, insulin  Admittedly poorly compliant with lifestyle and medications due to intolerance, typically GI sx Continues Trulicity  Last A1C in the office was:  Lab Results  Component Value Date   HGBA1C 7.0 (H) 02/22/2023   She has CKD I/II associated with T2DM monitored at this office:  Lab Results  Component Value Date   GFRNONAA >60 06/28/2022   Patient is on Vitamin D  supplement, 5000 IU gummies, when she remembers  Lab Results  Component Value Date   VD25OH 47 11/22/2022      Medication Review: Current Outpatient Medications on File Prior to Visit  Medication Sig Dispense Refill   ALPRAZolam  (XANAX ) 0.5 MG tablet TAKE 1/2 TO 1 (ONE-HALF TO ONE) TABLET BY MOUTH AT BEDTIME AS NEEDED SLEEP  OR  ANXIETY 25 tablet 0   Cholecalciferol (DIALYVITE VITAMIN D  5000) 125 MCG (5000 UT) capsule Take 5,000 Units by mouth once a week.     citalopram  (CELEXA ) 20 MG tablet Take 1 tablet by mouth once daily 90 tablet 0   docusate sodium  (COLACE) 100 MG capsule Take 1 capsule (100 mg total) by mouth 2 (two) times daily. 60 capsule 2   Dulaglutide  (TRULICITY ) 3 MG/0.5ML SOPN Inject 3 mg as directed once a week. 6 mL 2   glimepiride  (AMARYL ) 4 MG tablet TAKE 1 TABLET BY MOUTH TWICE DAILY  WITH MEALS FOR DIABETES 180 tablet 0   ondansetron  (ZOFRAN ) 4 MG tablet Take 1 tablet (4 mg total) by mouth every 8 (eight) hours as needed for nausea or vomiting. 60 tablet 1   promethazine  (PHENERGAN ) 25 MG tablet Take 1 tablet (25 mg total) by mouth every 6 (six) hours as needed for nausea or vomiting. 30 tablet 3   QUEtiapine  (SEROQUEL ) 25 MG tablet TAKE 1 TABLET BY MOUTH AT BEDTIME AS NEEDED FOR SLEEP 90 tablet 0   acetaminophen  (TYLENOL ) 500 MG tablet Take 2 tablets (1,000 mg total) by mouth every 6 (six) hours. (Patient not taking: Reported on 06/06/2023) 120 tablet 3   ciprofloxacin  (CIPRO ) 250 MG tablet Take 1 tablet 2 x /day with Food for Infection 14 tablet 0   ibuprofen  (ADVIL ) 600 MG tablet Take 1 tablet (600 mg total) by mouth 4 (four) times daily. (Patient not taking: Reported on 06/06/2023) 120 tablet 1   metFORMIN  (GLUCOPHAGE -XR) 500 MG 24 hr tablet Take   1 to 2 tablets   2 x /day   with Meals  for Diabetes. (Patient not taking: Reported on 06/06/2023) 360 tablet 3   No current facility-administered medications on file prior to visit.    Allergies  Allergen Reactions   Codeine     Unknown reaction    Glipizide  Nausea And Vomiting  Lunesta [Eszopiclone]     Excessive fatigue   Metformin  And Related     Nausea and abdominal pain   Welchol [Colesevelam Hcl] Nausea Only     Current Problems (verified) Patient Active Problem List   Diagnosis Date Noted   History of adenomatous polyp of colon 09/18/2021   B12 deficiency 07/15/2021   Diabetic peripheral neuropathy associated with type 2 diabetes mellitus ([name]) 07/14/2021   History of colon cancer 07/10/2021   Calcification of abdominal aorta ([name]) 07/02/2020   Adrenal adenoma, left 07/02/2020   Medication nonadherence due to intolerance 06/25/2020   LAFB (left anterior fascicular block) 06/24/2020   3-vessel CAD (per CT chest 11/2019) 06/24/2020   Constipation 06/24/2020   Mitral valve annular calcification 03/07/2020    Poor compliance 12/07/2019   Aortic atherosclerosis ([name]) 08/01/2019   CKD stage 2 due to type 2 diabetes mellitus ([name]) 07/20/2019   Tobacco abuse 04/16/2019   Depression, major, recurrent, in partial remission ([name]) 01/10/2019   Arthralgia of both feet 10/04/2018   Overweight (BMI 25.0-29.9) 09/29/2017   Hyperlipidemia associated with type 2 diabetes mellitus ([name])    Diabetes ([name])    Vitamin D  deficiency    Anxiety    Leukocytosis 09/18/2012    Screening Tests Immunization History  Administered Date(s) Administered   Pneumococcal Polysaccharide-23 01/07/2020   Preventative care: Last colonoscopy: 2010 Dr. Luis, overdue, hx of colon cancer, patient saw [facility] 2022 - 2023  Last mammogram: 05/2021 - ordered Last pap smear/pelvic exam: 05/2013, hx of abnormal but had normal x 2 last checks, declines further DEXA: ordered but patient declines to schedule at this time   Prior vaccinations: TD or Tdap: declines   Influenza: declines Pneumococcal: 12/2019 Prevnar13: declines Shingles/Zostavax: declines  Covid 19: declines   Names of Other Physician/Practitioners you currently use: 1. [facility] here for primary care 2. [facility], eye doctor, last visit 2024, reminded to schedule this year 3. [facility] [employer], education officer, community, last visit 2020, declines follow up, has full dentures  Patient Care Team: Tonita Fallow, MD as PCP - General (Internal Medicine) Amadeo Windell SAILOR, MD (Inactive) as Consulting Physician (Oncology)  SURGICAL HISTORY She  has a past surgical history that includes Appendectomy; Partial colectomy (2010); Laparoscopic cholecystectomy (2010); Colonoscopy; Breast biopsy (Left, 06/10/2016); and Mass excision (N/A, 06/28/2022). FAMILY HISTORY Her family history includes Cancer - Ovarian in her mother; Diabetes in her paternal grandmother and sister; Gastric cancer in her paternal aunt; Heart disease in her  brother; Hyperlipidemia in her mother and sister; Hypertension in her mother. SOCIAL HISTORY She  reports that she has been smoking cigarettes. She started smoking about 47 years ago. She has a 23.5 pack-year smoking history. She has never used smokeless tobacco. She reports that she does not drink alcohol and does not use drugs.  Depression/mood screen:      11/22/2022    9:42 PM  Depression screen PHQ 2/9  Decreased Interest 0  Down, Depressed, Hopeless 0  PHQ - 2 Score 0     Review of Systems  Constitutional:  Negative for malaise/fatigue and weight loss.  HENT:  Negative for hearing loss and tinnitus.   Eyes:  Negative for blurred vision and double vision.  Respiratory:  Negative for cough, sputum production, shortness of breath and wheezing.   Cardiovascular:  Negative for chest pain, palpitations, orthopnea, claudication, leg swelling and PND.  Gastrointestinal:  Positive for constipation (chronic). Negative for abdominal pain, blood in stool, diarrhea, heartburn,  melena, nausea and vomiting.  Genitourinary: Negative.   Musculoskeletal:  Negative for falls (1 fall in 12 months ), joint pain (bil feet, improved) and myalgias.  Skin:  Negative for rash.  Neurological:  Negative for dizziness, tingling, sensory change, weakness and headaches.  Endo/Heme/Allergies:  Negative for polydipsia.  Psychiatric/Behavioral:  Negative for depression, memory loss, substance abuse and suicidal ideas. The patient is not nervous/anxious and does not have insomnia.   All other systems reviewed and are negative.    Objective:     Today's Vitals   06/06/23 1501  BP: 128/64  Pulse: 85  Temp: (!) 97.5 F (36.4 C)  SpO2: 97%  Weight: 159 lb 6.4 oz (72.3 kg)  Height: 5' 3 (1.6 m)   Body mass index is 28.24 kg/m.  General appearance: alert, no distress, WD/WN, female HEENT: normocephalic, sclerae anicteric, TMs pearly, nares patent, no discharge or erythema, pharynx normal Oral cavity:  MMM, no lesions Neck: supple, no lymphadenopathy, no thyromegaly, no masses Heart: RRR, normal S1, S2, no murmurs  Lungs: CTA bilaterally, no wheezes, rhonchi, or rales Abdomen: +bs, firm, mildly distended, generalized tenderness with light palpation, no distinct palpable masses or organomegaly; tenderness limiting exam Musculoskeletal: nontender, no swelling, no obvious deformity Extremities: no edema, no cyanosis, no clubbing Pulses: 2+ symmetric, upper and lower extremities, normal cap refill Neurological: alert, oriented x 3, CN2-12 intact, strength normal upper extremities and lower extremities, sensation normal throughout, DTRs 2+ throughout, no cerebellar signs, gait slow steady Psychiatric: normal affect, behavior normal, pleasant   Lindalou Soltis, NP   06/06/2023

## 2023-06-06 NOTE — Patient Instructions (Signed)

## 2023-06-07 ENCOUNTER — Other Ambulatory Visit: Payer: Self-pay | Admitting: Nurse Practitioner

## 2023-06-07 DIAGNOSIS — G4700 Insomnia, unspecified: Secondary | ICD-10-CM

## 2023-06-07 LAB — CBC WITH DIFFERENTIAL/PLATELET
Absolute Lymphocytes: 5605 {cells}/uL — ABNORMAL HIGH (ref 850–3900)
Absolute Monocytes: 664 {cells}/uL (ref 200–950)
Basophils Absolute: 146 {cells}/uL (ref 0–200)
Basophils Relative: 0.9 %
Eosinophils Absolute: 340 {cells}/uL (ref 15–500)
Eosinophils Relative: 2.1 %
HCT: 41.2 % (ref 35.0–45.0)
Hemoglobin: 12.7 g/dL (ref 11.7–15.5)
MCH: 19 pg — ABNORMAL LOW (ref 27.0–33.0)
MCHC: 30.8 g/dL — ABNORMAL LOW (ref 32.0–36.0)
MCV: 61.5 fL — ABNORMAL LOW (ref 80.0–100.0)
MPV: 9.9 fL (ref 7.5–12.5)
Monocytes Relative: 4.1 %
Neutro Abs: 9445 {cells}/uL — ABNORMAL HIGH (ref 1500–7800)
Neutrophils Relative %: 58.3 %
Platelets: 454 10*3/uL — ABNORMAL HIGH (ref 140–400)
RBC: 6.7 10*6/uL — ABNORMAL HIGH (ref 3.80–5.10)
RDW: 17.8 % — ABNORMAL HIGH (ref 11.0–15.0)
Total Lymphocyte: 34.6 %
WBC: 16.2 10*3/uL — ABNORMAL HIGH (ref 3.8–10.8)

## 2023-06-07 LAB — COMPLETE METABOLIC PANEL WITH GFR
AG Ratio: 1.7 (calc) (ref 1.0–2.5)
ALT: 8 U/L (ref 6–29)
AST: 10 U/L (ref 10–35)
Albumin: 4.6 g/dL (ref 3.6–5.1)
Alkaline phosphatase (APISO): 82 U/L (ref 37–153)
BUN: 15 mg/dL (ref 7–25)
CO2: 27 mmol/L (ref 20–32)
Calcium: 10.2 mg/dL (ref 8.6–10.4)
Chloride: 101 mmol/L (ref 98–110)
Creat: 0.76 mg/dL (ref 0.50–1.05)
Globulin: 2.7 g/dL (ref 1.9–3.7)
Glucose, Bld: 106 mg/dL — ABNORMAL HIGH (ref 65–99)
Potassium: 4.5 mmol/L (ref 3.5–5.3)
Sodium: 138 mmol/L (ref 135–146)
Total Bilirubin: 0.6 mg/dL (ref 0.2–1.2)
Total Protein: 7.3 g/dL (ref 6.1–8.1)
eGFR: 85 mL/min/{1.73_m2} (ref >=60)

## 2023-06-07 LAB — HEMOGLOBIN A1C
Hgb A1c MFr Bld: 6.9 %{Hb} — ABNORMAL HIGH (ref <5.7)
Mean Plasma Glucose: 151 mg/dL
eAG (mmol/L): 8.4 mmol/L

## 2023-06-07 LAB — LIPID PANEL
Cholesterol: 246 mg/dL — ABNORMAL HIGH (ref <200)
HDL: 35 mg/dL — ABNORMAL LOW (ref >=50)
LDL Cholesterol (Calc): 172 mg/dL — ABNORMAL HIGH
Non-HDL Cholesterol (Calc): 211 mg/dL — ABNORMAL HIGH (ref <130)
Total CHOL/HDL Ratio: 7 (calc) — ABNORMAL HIGH (ref <5.0)
Triglycerides: 219 mg/dL — ABNORMAL HIGH (ref <150)

## 2023-06-08 ENCOUNTER — Encounter: Payer: Self-pay | Admitting: Nurse Practitioner

## 2023-06-08 ENCOUNTER — Other Ambulatory Visit: Payer: Self-pay | Admitting: Nurse Practitioner

## 2023-06-08 DIAGNOSIS — K581 Irritable bowel syndrome with constipation: Secondary | ICD-10-CM

## 2023-06-08 MED ORDER — TRULANCE 3 MG PO TABS: 3.0000 mg | ORAL_TABLET | Freq: Every day | ORAL | 0 refills | Status: AC

## 2023-06-09 ENCOUNTER — Other Ambulatory Visit: Payer: Self-pay | Admitting: Nurse Practitioner

## 2023-06-09 ENCOUNTER — Encounter: Payer: Self-pay | Admitting: Nurse Practitioner

## 2023-06-09 DIAGNOSIS — E1169 Type 2 diabetes mellitus with other specified complication: Secondary | ICD-10-CM

## 2023-06-09 DIAGNOSIS — I700 Atherosclerosis of aorta: Secondary | ICD-10-CM

## 2023-06-09 DIAGNOSIS — I2510 Atherosclerotic heart disease of native coronary artery without angina pectoris: Secondary | ICD-10-CM

## 2023-06-09 MED ORDER — FENOFIBRATE 48 MG PO TABS: 48.0000 mg | ORAL_TABLET | Freq: Every day | ORAL | 11 refills | Status: AC

## 2023-06-22 ENCOUNTER — Other Ambulatory Visit: Payer: Self-pay | Admitting: Internal Medicine

## 2023-06-22 DIAGNOSIS — E0822 Diabetes mellitus due to underlying condition with diabetic chronic kidney disease: Secondary | ICD-10-CM

## 2023-07-11 ENCOUNTER — Other Ambulatory Visit: Payer: Self-pay | Admitting: Nurse Practitioner

## 2023-07-11 DIAGNOSIS — G4700 Insomnia, unspecified: Secondary | ICD-10-CM

## 2023-07-12 MED ORDER — ALPRAZOLAM 0.5 MG PO TABS: ORAL_TABLET | ORAL | 0 refills | Status: AC

## 2023-07-21 ENCOUNTER — Other Ambulatory Visit: Payer: Self-pay

## 2023-07-21 DIAGNOSIS — F3341 Major depressive disorder, recurrent, in partial remission: Secondary | ICD-10-CM

## 2023-07-21 MED ORDER — CITALOPRAM HYDROBROMIDE 20 MG PO TABS: ORAL_TABLET | ORAL | 0 refills | Status: AC

## 2023-08-22 ENCOUNTER — Encounter: Payer: Medicare Other | Admitting: Nurse Practitioner

## 2023-08-29 ENCOUNTER — Other Ambulatory Visit: Payer: Self-pay | Admitting: Nurse Practitioner

## 2023-08-29 ENCOUNTER — Encounter: Payer: Medicare Other | Admitting: Nurse Practitioner

## 2023-08-29 DIAGNOSIS — F3341 Major depressive disorder, recurrent, in partial remission: Secondary | ICD-10-CM

## 2023-08-29 MED ORDER — QUETIAPINE FUMARATE 25 MG PO TABS: 25.0000 mg | ORAL_TABLET | Freq: Every evening | ORAL | 0 refills | Status: AC | PRN

## 2023-09-06 ENCOUNTER — Encounter: Payer: Medicare Other | Admitting: Nurse Practitioner

## 2023-09-19 ENCOUNTER — Other Ambulatory Visit: Payer: Self-pay | Admitting: Family

## 2023-09-19 ENCOUNTER — Other Ambulatory Visit: Payer: Self-pay

## 2023-09-19 DIAGNOSIS — G4700 Insomnia, unspecified: Secondary | ICD-10-CM

## 2023-09-26 ENCOUNTER — Encounter: Payer: Self-pay | Admitting: Internal Medicine

## 2023-10-23 ENCOUNTER — Other Ambulatory Visit: Payer: Self-pay | Admitting: Family

## 2023-10-23 DIAGNOSIS — F3341 Major depressive disorder, recurrent, in partial remission: Secondary | ICD-10-CM

## 2023-11-28 ENCOUNTER — Other Ambulatory Visit: Payer: Self-pay | Admitting: Family

## 2023-11-28 DIAGNOSIS — F3341 Major depressive disorder, recurrent, in partial remission: Secondary | ICD-10-CM

## 2024-05-30 ENCOUNTER — Ambulatory Visit: Payer: Medicare Other | Admitting: Nurse Practitioner

## 2024-06-06 ENCOUNTER — Ambulatory Visit: Payer: Medicare Other | Admitting: Nurse Practitioner
